# Patient Record
Sex: Male | Born: 1955 | Race: White | Hispanic: No | Marital: Married | Smoking: Former smoker
Health system: Southern US, Community
[De-identification: ages and names within clinical notes are randomized; demographics above are authoritative.]

## PROBLEM LIST (undated history)

## (undated) DIAGNOSIS — F32A Depression, unspecified: Secondary | ICD-10-CM

## (undated) DIAGNOSIS — E785 Hyperlipidemia, unspecified: Secondary | ICD-10-CM

## (undated) DIAGNOSIS — I82409 Acute embolism and thrombosis of unspecified deep veins of unspecified lower extremity: Secondary | ICD-10-CM

## (undated) DIAGNOSIS — G894 Chronic pain syndrome: Secondary | ICD-10-CM

## (undated) DIAGNOSIS — K5790 Diverticulosis of intestine, part unspecified, without perforation or abscess without bleeding: Secondary | ICD-10-CM

## (undated) DIAGNOSIS — G8929 Other chronic pain: Secondary | ICD-10-CM

## (undated) DIAGNOSIS — R7301 Impaired fasting glucose: Secondary | ICD-10-CM

## (undated) DIAGNOSIS — Z8489 Family history of other specified conditions: Secondary | ICD-10-CM

## (undated) DIAGNOSIS — F329 Major depressive disorder, single episode, unspecified: Secondary | ICD-10-CM

## (undated) DIAGNOSIS — G709 Myoneural disorder, unspecified: Secondary | ICD-10-CM

## (undated) DIAGNOSIS — M1990 Unspecified osteoarthritis, unspecified site: Secondary | ICD-10-CM

## (undated) DIAGNOSIS — I10 Essential (primary) hypertension: Secondary | ICD-10-CM

## (undated) DIAGNOSIS — K429 Umbilical hernia without obstruction or gangrene: Secondary | ICD-10-CM

## (undated) DIAGNOSIS — M25569 Pain in unspecified knee: Secondary | ICD-10-CM

## (undated) DIAGNOSIS — Z87442 Personal history of urinary calculi: Secondary | ICD-10-CM

## (undated) DIAGNOSIS — H6690 Otitis media, unspecified, unspecified ear: Secondary | ICD-10-CM

## (undated) DIAGNOSIS — M7050 Other bursitis of knee, unspecified knee: Secondary | ICD-10-CM

## (undated) DIAGNOSIS — K219 Gastro-esophageal reflux disease without esophagitis: Secondary | ICD-10-CM

## (undated) HISTORY — DX: Impaired fasting glucose: R73.01

## (undated) HISTORY — DX: Unspecified osteoarthritis, unspecified site: M19.90

## (undated) HISTORY — PX: SHOULDER ARTHROSCOPY WITH ROTATOR CUFF REPAIR: SHX5685

## (undated) HISTORY — DX: Umbilical hernia without obstruction or gangrene: K42.9

## (undated) HISTORY — DX: Essential (primary) hypertension: I10

## (undated) HISTORY — DX: Acute embolism and thrombosis of unspecified deep veins of unspecified lower extremity: I82.409

## (undated) HISTORY — DX: Major depressive disorder, single episode, unspecified: F32.9

## (undated) HISTORY — DX: Other bursitis of knee, unspecified knee: M70.50

## (undated) HISTORY — DX: Gastro-esophageal reflux disease without esophagitis: K21.9

## (undated) HISTORY — DX: Other chronic pain: G89.29

## (undated) HISTORY — PX: KIDNEY STONE SURGERY: SHX686

## (undated) HISTORY — DX: Hyperlipidemia, unspecified: E78.5

## (undated) HISTORY — DX: Depression, unspecified: F32.A

## (undated) HISTORY — DX: Chronic pain syndrome: G89.4

## (undated) HISTORY — DX: Pain in unspecified knee: M25.569

## (undated) HISTORY — DX: Diverticulosis of intestine, part unspecified, without perforation or abscess without bleeding: K57.90

## (undated) HISTORY — DX: Myoneural disorder, unspecified: G70.9

---

## 2004-04-27 ENCOUNTER — Emergency Department (HOSPITAL_COMMUNITY): Admission: EM | Admit: 2004-04-27 | Discharge: 2004-04-27 | Payer: Self-pay | Admitting: *Deleted

## 2007-03-28 ENCOUNTER — Ambulatory Visit: Payer: Self-pay | Admitting: Unknown Physician Specialty

## 2007-03-28 ENCOUNTER — Other Ambulatory Visit: Payer: Self-pay

## 2007-10-02 HISTORY — PX: JOINT REPLACEMENT: SHX530

## 2009-06-13 ENCOUNTER — Ambulatory Visit: Payer: Self-pay | Admitting: Gastroenterology

## 2009-06-21 LAB — HM COLONOSCOPY

## 2009-12-14 LAB — HM SIGMOIDOSCOPY

## 2011-12-21 ENCOUNTER — Ambulatory Visit: Payer: Self-pay | Admitting: Podiatry

## 2012-08-25 ENCOUNTER — Ambulatory Visit: Payer: Self-pay | Admitting: Family Medicine

## 2013-10-01 HISTORY — PX: COLONOSCOPY: SHX174

## 2014-04-16 DIAGNOSIS — I10 Essential (primary) hypertension: Secondary | ICD-10-CM | POA: Insufficient documentation

## 2014-09-15 ENCOUNTER — Encounter: Payer: Self-pay | Admitting: Family Medicine

## 2014-09-15 LAB — HM COLONOSCOPY

## 2015-04-19 ENCOUNTER — Ambulatory Visit (INDEPENDENT_AMBULATORY_CARE_PROVIDER_SITE_OTHER): Payer: BLUE CROSS/BLUE SHIELD | Admitting: Family Medicine

## 2015-04-19 ENCOUNTER — Encounter: Payer: Self-pay | Admitting: Family Medicine

## 2015-04-19 ENCOUNTER — Other Ambulatory Visit: Payer: Self-pay | Admitting: Family Medicine

## 2015-04-19 VITALS — BP 132/84 | HR 66 | Temp 98.4°F | Ht 68.6 in | Wt 266.7 lb

## 2015-04-19 DIAGNOSIS — I10 Essential (primary) hypertension: Secondary | ICD-10-CM | POA: Diagnosis not present

## 2015-04-19 DIAGNOSIS — Z6841 Body Mass Index (BMI) 40.0 and over, adult: Secondary | ICD-10-CM | POA: Insufficient documentation

## 2015-04-19 DIAGNOSIS — E669 Obesity, unspecified: Secondary | ICD-10-CM

## 2015-04-19 DIAGNOSIS — M170 Bilateral primary osteoarthritis of knee: Secondary | ICD-10-CM | POA: Diagnosis not present

## 2015-04-19 DIAGNOSIS — E785 Hyperlipidemia, unspecified: Secondary | ICD-10-CM

## 2015-04-19 DIAGNOSIS — I129 Hypertensive chronic kidney disease with stage 1 through stage 4 chronic kidney disease, or unspecified chronic kidney disease: Secondary | ICD-10-CM | POA: Insufficient documentation

## 2015-04-19 DIAGNOSIS — L0291 Cutaneous abscess, unspecified: Secondary | ICD-10-CM | POA: Diagnosis not present

## 2015-04-19 LAB — LIPID PANEL PICCOLO, WAIVED
Chol/HDL Ratio Piccolo,Waive: 4.6 mg/dL
Cholesterol Piccolo, Waived: 167 mg/dL (ref <200)
HDL Chol Piccolo, Waived: 36 mg/dL — ABNORMAL LOW (ref >59)
LDL Chol Calc Piccolo Waived: 109 mg/dL — ABNORMAL HIGH (ref <100)
Triglycerides Piccolo,Waived: 108 mg/dL (ref <150)
VLDL CHOL CALC PICCOLO,WAIVE: 22 mg/dL (ref <30)

## 2015-04-19 LAB — UA/M W/RFLX CULTURE, ROUTINE
Bilirubin, UA: NEGATIVE
GLUCOSE, UA: NEGATIVE
Ketones, UA: NEGATIVE
Leukocytes, UA: NEGATIVE
Nitrite, UA: NEGATIVE
RBC UA: NEGATIVE
SPEC GRAV UA: 1.03 (ref 1.005–1.030)
Urobilinogen, Ur: 0.2 mg/dL (ref 0.2–1.0)
pH, UA: 5.5 (ref 5.0–7.5)

## 2015-04-19 LAB — MICROALBUMIN, URINE WAIVED
Creatinine, Urine Waived: 300 mg/dL (ref 10–300)
Microalb, Ur Waived: 30 mg/L — ABNORMAL HIGH (ref 0–19)
Microalb/Creat Ratio: <30 mg/g (ref <30)

## 2015-04-19 MED ORDER — METOPROLOL SUCCINATE ER 100 MG PO TB24: 100.0000 mg | ORAL_TABLET | Freq: Every day | ORAL | Status: DC

## 2015-04-19 MED ORDER — SULFAMETHOXAZOLE-TRIMETHOPRIM 800-160 MG PO TABS: 1.0000 | ORAL_TABLET | Freq: Two times a day (BID) | ORAL | Status: DC

## 2015-04-19 MED ORDER — DICLOFENAC SODIUM 75 MG PO TBEC: 75.0000 mg | DELAYED_RELEASE_TABLET | Freq: Two times a day (BID) | ORAL | Status: DC

## 2015-04-19 MED ORDER — TRAMADOL HCL 50 MG PO TABS: 50.0000 mg | ORAL_TABLET | Freq: Four times a day (QID) | ORAL | Status: DC | PRN

## 2015-04-19 MED ORDER — BENAZEPRIL HCL 40 MG PO TABS: 40.0000 mg | ORAL_TABLET | Freq: Every day | ORAL | Status: DC

## 2015-04-19 NOTE — Patient Instructions (Signed)

## 2015-04-19 NOTE — Assessment & Plan Note (Signed)
Continue to follow with orthopedics. Rx for tramadol refilled today. Continue to monitor.

## 2015-04-19 NOTE — Assessment & Plan Note (Signed)
Better on recheck. Continue current regimen. Continue to monitor.  

## 2015-04-19 NOTE — Assessment & Plan Note (Signed)
Continue diet and exercise. Continue to monitor. CMP checked today.

## 2015-04-19 NOTE — Progress Notes (Signed)
BP 132/84 mmHg  Pulse 66  Temp(Src) 98.4 F (36.9 C)  Ht 5' 8.6" (1.742 m)  Wt 266 lb 11.2 oz (120.974 kg)  BMI 39.87 kg/m2  SpO2 97%   Subjective:    Patient ID: Andrew [name], male    DOB: [birth year], 57 y.o.   MRN: [id number]  HPI: Andrew [name] is a 59 y.o. male  Chief Complaint  Patient presents with  . Hypertension  . Hyperlipidemia   HYPERTENSION Hypertension status: controlled  Satisfied with current treatment? yes Duration of hypertension: chronic BP monitoring frequency:  not checking BP medication side effects:  no Medication compliance: excellent compliance Aspirin: yes Recurrent headaches: no Visual changes: no Palpitations: no Dyspnea: no Chest pain: no Lower extremity edema: no Dizzy/lightheaded: no   Knees are doing better. Has been having problems with anserine bursitis and is working on that.   SKIN LESION Duration: 5-6 days Location: lower abdomen Painful: yes Itching: no Onset: sudden Context: not changing Associated signs and symptoms: pain and oozing, open again History of skin cancer: no History of precancerous skin lesions: no Family history of skin cancer: no   Relevant past medical, surgical, family and social history reviewed and updated as indicated. Interim medical history since our last visit reviewed. Allergies and medications reviewed and updated.  Review of Systems  Constitutional: Negative.   Respiratory: Negative.   Cardiovascular: Negative.   Gastrointestinal: Negative.   Musculoskeletal: Positive for arthralgias. Negative for myalgias, back pain, joint swelling, gait problem, neck pain and neck stiffness.  Psychiatric/Behavioral: Negative.    Per HPI unless specifically indicated above     Objective:    BP 132/84 mmHg  Pulse 66  Temp(Src) 98.4 F (36.9 C)  Ht 5' 8.6" (1.742 m)  Wt 266 lb 11.2 oz (120.974 kg)  BMI 39.87 kg/m2  SpO2 97%  Wt Readings from Last 3 Encounters:  04/19/15 266 lb 11.2 oz (120.974  kg)  11/04/14 273 lb (123.832 kg)    Physical Exam  Constitutional: He is oriented to person, place, and time. He appears well-developed and well-nourished. No distress.  HENT:  Head: Normocephalic and atraumatic.  Right Ear: Hearing normal.  Left Ear: Hearing normal.  Nose: Nose normal.  Eyes: Conjunctivae and lids are normal. Right eye exhibits no discharge. Left eye exhibits no discharge. No scleral icterus.  Cardiovascular: Normal rate, regular rhythm and normal heart sounds.  Exam reveals no gallop and no friction rub.   No murmur heard. Pulmonary/Chest: Effort normal and breath sounds normal. No respiratory distress. He has no wheezes. He exhibits no tenderness.  Musculoskeletal: Normal range of motion.  Neurological: He is alert and oriented to person, place, and time.  Skin: Skin is warm, dry and intact. No rash noted. There is erythema. No pallor.  Open boil on the anterior abdomen, no fluctuance, mild erythema  Psychiatric: He has a normal mood and affect. His speech is normal and behavior is normal. Judgment and thought content normal. Cognition and memory are normal.  Nursing note and vitals reviewed.   No results found for this or any previous visit.    Assessment & Plan:   Problem List Items Addressed This Visit      Cardiovascular and Mediastinum   Essential hypertension    Better on recheck. Continue current regimen. Continue to monitor.       Relevant Medications   benazepril (LOTENSIN) 40 MG tablet   metoprolol succinate (TOPROL-XL) 100 MG 24 hr tablet     Other  Primary osteoarthritis of both knees    Continue to follow with orthopedics. Rx for tramadol refilled today. Continue to monitor.       Obesity    Continue diet and exercise. Continue to monitor. CMP checked today.        Other Visit Diagnoses    Abscess    -  Primary    Not fluctuant. Draining. Will treat with bactrim for 10 days. Call if not getting better or getting worse.      Hyperlipidemia        Cholesterol looks great today! Keep up diet and exercise. Recheck in 1 year.     Relevant Medications    benazepril (LOTENSIN) 40 MG tablet    metoprolol succinate (TOPROL-XL) 100 MG 24 hr tablet        Follow up plan: Return in about 3 months (around 07/20/2015).

## 2015-04-20 ENCOUNTER — Telehealth: Payer: Self-pay | Admitting: Family Medicine

## 2015-04-20 DIAGNOSIS — E875 Hyperkalemia: Secondary | ICD-10-CM

## 2015-04-20 LAB — COMPREHENSIVE METABOLIC PANEL
ALK PHOS: 92 IU/L (ref 39–117)
ALT: 30 IU/L (ref 0–44)
AST: 27 IU/L (ref 0–40)
Albumin/Globulin Ratio: 2.1 (ref 1.1–2.5)
Albumin: 4.1 g/dL (ref 3.5–5.5)
BILIRUBIN TOTAL: 1.1 mg/dL (ref 0.0–1.2)
BUN/Creatinine Ratio: 24 — ABNORMAL HIGH (ref 9–20)
BUN: 23 mg/dL (ref 6–24)
CHLORIDE: 101 mmol/L (ref 97–108)
CO2: 23 mmol/L (ref 18–29)
Calcium: 9.1 mg/dL (ref 8.7–10.2)
Creatinine, Ser: 0.94 mg/dL (ref 0.76–1.27)
GFR calc non Af Amer: 90 mL/min/{1.73_m2} (ref >59)
GFR, EST AFRICAN AMERICAN: 104 mL/min/{1.73_m2} (ref >59)
GLOBULIN, TOTAL: 2 g/dL (ref 1.5–4.5)
Glucose: 105 mg/dL — ABNORMAL HIGH (ref 65–99)
Potassium: 5.5 mmol/L — ABNORMAL HIGH (ref 3.5–5.2)
Sodium: 139 mmol/L (ref 134–144)
TOTAL PROTEIN: 6.1 g/dL (ref 6.0–8.5)

## 2015-04-20 LAB — TSH: TSH: 1.87 u[IU]/mL (ref 0.450–4.500)

## 2015-04-20 NOTE — Telephone Encounter (Signed)
Please let him know that his potassium is slightly elevated and that could be because of his medicine. He should avoid bananas ans salt substitutes and we'll recheck in 2 weeks. Order put in for BMP in 2 weeks- please put him on the lab schedule. Thank you!!!

## 2015-04-20 NOTE — Telephone Encounter (Signed)
Patient notified and pt scheduled.

## 2015-05-04 ENCOUNTER — Other Ambulatory Visit: Payer: BLUE CROSS/BLUE SHIELD

## 2015-05-04 DIAGNOSIS — E875 Hyperkalemia: Secondary | ICD-10-CM

## 2015-05-05 ENCOUNTER — Telehealth: Payer: Self-pay | Admitting: Family Medicine

## 2015-05-05 LAB — BASIC METABOLIC PANEL
BUN / CREAT RATIO: 24 — AB (ref 9–20)
BUN: 24 mg/dL (ref 6–24)
CHLORIDE: 101 mmol/L (ref 97–108)
CO2: 25 mmol/L (ref 18–29)
CREATININE: 1.02 mg/dL (ref 0.76–1.27)
Calcium: 8.8 mg/dL (ref 8.7–10.2)
GFR calc non Af Amer: 81 mL/min/{1.73_m2} (ref >59)
GFR, EST AFRICAN AMERICAN: 94 mL/min/{1.73_m2} (ref >59)
GLUCOSE: 98 mg/dL (ref 65–99)
Potassium: 5.6 mmol/L — ABNORMAL HIGH (ref 3.5–5.2)
SODIUM: 137 mmol/L (ref 134–144)

## 2015-05-05 NOTE — Telephone Encounter (Signed)
Called patient. Potassium still high. Not sure if he's eating anything with high potassium. Will send him a list. He will look it over and come back in 1 month for repeat BMP, if potassium still high, we will need to stop the benazepril.

## 2015-07-21 ENCOUNTER — Encounter: Payer: BLUE CROSS/BLUE SHIELD | Admitting: Family Medicine

## 2015-08-16 ENCOUNTER — Other Ambulatory Visit: Payer: Self-pay | Admitting: Family Medicine

## 2015-08-17 NOTE — Telephone Encounter (Signed)
He should be good until the middle of December. He also needs a 6 month follow up/physical coming in January, I'll refill it once he's got that booked

## 2015-08-17 NOTE — Telephone Encounter (Signed)
Called and left a message letting the patient know to call and schedule an appt, when this is done medication will be sent to his pharmacy.

## 2015-09-16 ENCOUNTER — Telehealth: Payer: Self-pay | Admitting: Family Medicine

## 2015-09-16 NOTE — Telephone Encounter (Signed)
Pt called and scheduled his CPE for 10/05/15 @ 8am. Pt stated he would need a refill on Tramadol just enough to last until his appt on 10/05/15. Pharm is [facility] on [address] in [place]. Thanks.

## 2015-09-19 MED ORDER — TRAMADOL HCL 50 MG PO TABS: 50.0000 mg | ORAL_TABLET | Freq: Four times a day (QID) | ORAL | Status: DC | PRN

## 2015-09-19 NOTE — Telephone Encounter (Signed)
[place] reviewed; other controlled substances are noted in his med list, prescribed by outside provider No early refills of the tramadol that I see Rx approved to last until primary returns

## 2015-09-19 NOTE — Telephone Encounter (Signed)
Forward to provider

## 2015-10-05 ENCOUNTER — Encounter: Payer: Self-pay | Admitting: Family Medicine

## 2015-10-05 ENCOUNTER — Ambulatory Visit (INDEPENDENT_AMBULATORY_CARE_PROVIDER_SITE_OTHER): Payer: BLUE CROSS/BLUE SHIELD | Admitting: Family Medicine

## 2015-10-05 VITALS — BP 149/84 | HR 65 | Temp 98.8°F | Ht 68.0 in | Wt 258.0 lb

## 2015-10-05 DIAGNOSIS — Z0000 Encounter for general adult medical examination without abnormal findings: Secondary | ICD-10-CM

## 2015-10-05 DIAGNOSIS — B372 Candidiasis of skin and nail: Secondary | ICD-10-CM

## 2015-10-05 DIAGNOSIS — I10 Essential (primary) hypertension: Secondary | ICD-10-CM

## 2015-10-05 DIAGNOSIS — Z23 Encounter for immunization: Secondary | ICD-10-CM | POA: Diagnosis not present

## 2015-10-05 LAB — UA/M W/RFLX CULTURE, ROUTINE
Bilirubin, UA: NEGATIVE
Glucose, UA: NEGATIVE
Leukocytes, UA: NEGATIVE
NITRITE UA: NEGATIVE
RBC, UA: NEGATIVE
Specific Gravity, UA: 1.03 (ref 1.005–1.030)
UUROB: 1 mg/dL (ref 0.2–1.0)
pH, UA: 5.5 (ref 5.0–7.5)

## 2015-10-05 LAB — MICROALBUMIN, URINE WAIVED
CREATININE, URINE WAIVED: 300 mg/dL (ref 10–300)
Microalb, Ur Waived: 80 mg/L — ABNORMAL HIGH (ref 0–19)

## 2015-10-05 MED ORDER — METOPROLOL SUCCINATE ER 100 MG PO TB24: ORAL_TABLET | ORAL | Status: DC

## 2015-10-05 MED ORDER — DICLOFENAC SODIUM 75 MG PO TBEC: DELAYED_RELEASE_TABLET | ORAL | Status: DC

## 2015-10-05 MED ORDER — TRAMADOL HCL 50 MG PO TABS: 50.0000 mg | ORAL_TABLET | Freq: Four times a day (QID) | ORAL | Status: DC | PRN

## 2015-10-05 MED ORDER — FLUCONAZOLE 150 MG PO TABS: 150.0000 mg | ORAL_TABLET | Freq: Every day | ORAL | Status: DC

## 2015-10-05 MED ORDER — BENAZEPRIL HCL 40 MG PO TABS: ORAL_TABLET | ORAL | Status: DC

## 2015-10-05 NOTE — Addendum Note (Signed)
Addended by: Valerie Roys on: 10/05/2015 11:53 AM   Modules accepted: Orders, SmartSet

## 2015-10-05 NOTE — Progress Notes (Signed)
BP 149/84 mmHg  Pulse 65  Temp(Src) 98.8 F (37.1 C)  Ht 5\' 8"  (1.727 m)  Wt 258 lb (117.028 kg)  BMI 39.24 kg/m2  SpO2 94%   Subjective:    Patient ID: Andrew [name], male    DOB: 02-01-1956, 60 y.o.   MRN: [id number]  HPI: Andrew [name] is a 60 y.o. male presenting on 10/05/2015 for comprehensive medical examination. Current medical complaints include:Has been having some pain in the perianal area with a rash. Has some blood when he wipes, has had a lot of problems getting clean. Being really careful to keep himself dry. Can't eat anything spicy or dairy. Sometimes has to use an enema.  He currently lives with: his wife Interim Problems from his last visit: no  Depression Screen done today and results listed below:  Depression screen [facility] 2/9 10/05/2015  Decreased Interest 0  Down, Depressed, Hopeless 0  PHQ - 2 Score 0    The patient does not have a history of falls. I did not complete a risk assessment for falls. A plan of care for falls was not documented.  Past Medical History:  Past Medical History  Diagnosis Date  . Depression   . Knee pain, chronic   . Diverticulosis   . Hyperlipidemia   . Hypertension   . Hernia, umbilical   . IFG (impaired fasting glucose)   . Pes anserine bursitis   . Chronic pain syndrome   . DVT (deep venous thrombosis) (HCC)     bilateral, was on anticoags for 38months    Surgical History:  Past Surgical History  Procedure Laterality Date  . Kidney stone surgery    . Joint replacement Bilateral 2009    replacement and revision    Medications:  Current Outpatient Prescriptions on File Prior to Visit  Medication Sig  . cyanocobalamin 1000 MCG tablet Take 500 mcg by mouth daily.  Marland Kitchen gabapentin (NEURONTIN) 300 MG capsule Take 600 mg by mouth at bedtime.   Marland Kitchen testosterone cypionate (DEPOTESTOSTERONE CYPIONATE) 200 MG/ML injection Inject into the muscle every 14 (fourteen) days.   No current facility-administered medications on file prior  to visit.    Allergies:  No Known Allergies  Social History:  Social History   Social History  . Marital Status: Married    Spouse Name: N/A  . Number of Children: N/A  . Years of Education: N/A   Occupational History  . Not on file.   Social History Main Topics  . Smoking status: Former Smoker    Quit date: 06/28/2011  . Smokeless tobacco: Never Used  . Alcohol Use: Yes     Comment: on occasion  . Drug Use: No  . Sexual Activity: Yes    Birth Control/ Protection: None   Other Topics Concern  . Not on file   Social History Narrative   History  Smoking status  . Former Smoker  . Quit date: 06/28/2011  Smokeless tobacco  . Never Used   History  Alcohol Use  . Yes    Comment: on occasion    Family History:  Family History  Problem Relation Age of Onset  . Alcohol abuse Father   . Hypertension Father   . Cirrhosis Father   . Hypertension Brother   . Stroke Paternal Grandmother   . Stroke Paternal Grandfather   . Pneumonia Mother     Past medical history, surgical history, medications, allergies, family history and social history reviewed with patient today and changes made  to appropriate areas of the chart.   Review of Systems  Constitutional: Positive for fever and diaphoresis (Off and on ). Negative for chills, weight loss and malaise/fatigue.  HENT: Negative.   Eyes: Negative.   Respiratory: Negative.   Cardiovascular: Negative.   Gastrointestinal: Negative.   Genitourinary: Negative.   Musculoskeletal: Negative.   Skin: Positive for rash. Negative for itching.  Neurological: Negative.  Negative for weakness.  Endo/Heme/Allergies: Negative.   Psychiatric/Behavioral: Negative.     All other ROS negative except what is listed above and in the HPI.      Objective:    BP 149/84 mmHg  Pulse 65  Temp(Src) 98.8 F (37.1 C)  Ht 5\' 8"  (1.727 m)  Wt 258 lb (117.028 kg)  BMI 39.24 kg/m2  SpO2 94%  Wt Readings from Last 3 Encounters:   10/05/15 258 lb (117.028 kg)  04/19/15 266 lb 11.2 oz (120.974 kg)  11/04/14 273 lb (123.832 kg)    Physical Exam  Constitutional: He is oriented to person, place, and time. He appears well-developed and well-nourished. No distress.  HENT:  Head: Normocephalic and atraumatic.  Right Ear: Hearing, tympanic membrane, external ear and ear canal normal.  Left Ear: Hearing, tympanic membrane, external ear and ear canal normal.  Nose: Nose normal.  Mouth/Throat: Uvula is midline, oropharynx is clear and moist and mucous membranes are normal. No oropharyngeal exudate.  Eyes: Conjunctivae, EOM and lids are normal. Pupils are equal, round, and reactive to light. Right eye exhibits no discharge. Left eye exhibits no discharge. No scleral icterus.  Neck: Normal range of motion. Neck supple. No JVD present. No tracheal deviation present. No thyromegaly present.  Cardiovascular: Normal rate, regular rhythm, normal heart sounds and intact distal pulses.  Exam reveals no gallop and no friction rub.   No murmur heard. Pulmonary/Chest: Effort normal and breath sounds normal. No stridor. No respiratory distress. He has no wheezes. He has no rales. He exhibits no tenderness.  Abdominal: Soft. Bowel sounds are normal. He exhibits no distension and no mass. There is no tenderness. There is no rebound and no guarding. A hernia (umbilical) is present. Hernia confirmed negative in the right inguinal area and confirmed negative in the left inguinal area.  Genitourinary: Rectum normal, testes normal and penis normal. Cremasteric reflex is present. Circumcised. No penile tenderness.  Red irritated skin in perianal area  Musculoskeletal: Normal range of motion. He exhibits no edema or tenderness.  Lymphadenopathy:    He has no cervical adenopathy.       Right: No inguinal adenopathy present.       Left: No inguinal adenopathy present.  Neurological: He is alert and oriented to person, place, and time. He has normal  reflexes. He displays normal reflexes. No cranial nerve deficit. He exhibits normal muscle tone. Coordination normal.  Skin: Skin is warm, dry and intact. No rash noted. He is not diaphoretic. No erythema. No pallor.  Psychiatric: He has a normal mood and affect. His speech is normal and behavior is normal. Judgment and thought content normal. Cognition and memory are normal.  Nursing note and vitals reviewed.   Results for orders placed or performed in visit on 123456  Basic metabolic panel  Result Value Ref Range   Glucose 98 65 - 99 mg/dL   BUN 24 6 - 24 mg/dL   Creatinine, Ser 1.02 0.76 - 1.27 mg/dL   GFR calc non Af Amer 81 >59 mL/min/1.73   GFR calc Af Amer 94 >59 mL/min/1.73  BUN/Creatinine Ratio 24 (H) 9 - 20   Sodium 137 134 - 144 mmol/L   Potassium 5.6 (H) 3.5 - 5.2 mmol/L   Chloride 101 97 - 108 mmol/L   CO2 25 18 - 29 mmol/L   Calcium 8.8 8.7 - 10.2 mg/dL      Assessment & Plan:   Problem List Items Addressed This Visit      Cardiovascular and Mediastinum   Essential hypertension    BP elevated. Possibly due to weight gain and increased NSAID. Stop meloxicam and DASH diet, check again in 1-2 months.       Relevant Medications   benazepril (LOTENSIN) 40 MG tablet   metoprolol succinate (TOPROL-XL) 100 MG 24 hr tablet   Other Relevant Orders   Microalbumin, Urine Waived    Other Visit Diagnoses    Candidal intertrigo    -  Primary    Will start diflucan. Continue to monitor. Call if not getting better or getting worse.     Relevant Medications    fluconazole (DIFLUCAN) 150 MG tablet    Immunization due        Flu shot given today.    Relevant Orders    Flu Vaccine QUAD 36+ mos PF IM (Fluarix & Fluzone Quad PF) (Completed)    Routine general medical examination at a health care facility        Screening labs checked today. Up to date on vaccines. Up to date on colonoscopy. Continue diet and exercise. Continue to monitor.     Relevant Orders    CBC with  Differential/Platelet    Comprehensive metabolic panel    Lipid Panel w/o Chol/HDL Ratio    TSH    UA/M w/rflx Culture, Routine    PSA    Microalbumin, Urine Waived        Discussed aspirin prophylaxis for myocardial infarction prevention and decision was it was not indicated, he is on arimidex.   LABORATORY TESTING:  Health maintenance labs ordered today as discussed above.   The natural history of prostate cancer and ongoing controversy regarding screening and potential treatment outcomes of prostate cancer has been discussed with the patient. The meaning of a false positive PSA and a false negative PSA has been discussed. He indicates understanding of the limitations of this screening test and wishes to proceed with screening PSA testing.  IMMUNIZATIONS:   - Tdap: Tetanus vaccination status reviewed: last tetanus booster within 10 years. - Influenza: Administered today  SCREENING: - Colonoscopy: Up to date  Discussed with patient purpose of the colonoscopy is to detect colon cancer at curable precancerous or early stages   PATIENT COUNSELING:    Sexuality: Discussed sexually transmitted diseases, partner selection, use of condoms, avoidance of unintended pregnancy  and contraceptive alternatives.   Advised to avoid cigarette smoking.  I discussed with the patient that most people either abstain from alcohol or drink within safe limits (<=14/week and <=4 drinks/occasion for males, <=7/weeks and <= 3 drinks/occasion for females) and that the risk for alcohol disorders and other health effects rises proportionally with the number of drinks per week and how often a drinker exceeds daily limits.  Discussed cessation/primary prevention of drug use and availability of treatment for abuse.   Diet: Encouraged to adjust caloric intake to maintain  or achieve ideal body weight, to reduce intake of dietary saturated fat and total fat, to limit sodium intake by avoiding high sodium foods and  not adding table salt, and to maintain adequate dietary  potassium and calcium preferably from fresh fruits, vegetables, and low-fat dairy products.    stressed the importance of regular exercise  Injury prevention: Discussed safety belts, safety helmets, smoke detector, smoking near bedding or upholstery.   Dental health: Discussed importance of regular tooth brushing, flossing, and dental visits.   Follow up plan: NEXT PREVENTATIVE PHYSICAL DUE IN 1 YEAR. Return 1-2 months, for BP follow up.

## 2015-10-05 NOTE — Assessment & Plan Note (Signed)
BP elevated. Possibly due to weight gain and increased NSAID. Stop meloxicam and DASH diet, check again in 1-2 months.

## 2015-10-06 ENCOUNTER — Other Ambulatory Visit: Payer: Self-pay | Admitting: Family Medicine

## 2015-10-06 ENCOUNTER — Encounter: Payer: Self-pay | Admitting: Family Medicine

## 2015-10-06 LAB — COMPREHENSIVE METABOLIC PANEL
ALBUMIN: 4 g/dL (ref 3.5–5.5)
ALT: 22 IU/L (ref 0–44)
AST: 16 IU/L (ref 0–40)
Albumin/Globulin Ratio: 2 (ref 1.1–2.5)
Alkaline Phosphatase: 93 IU/L (ref 39–117)
BILIRUBIN TOTAL: 0.7 mg/dL (ref 0.0–1.2)
BUN / CREAT RATIO: 26 — AB (ref 9–20)
BUN: 25 mg/dL — AB (ref 6–24)
CALCIUM: 8.7 mg/dL (ref 8.7–10.2)
CHLORIDE: 103 mmol/L (ref 96–106)
CO2: 24 mmol/L (ref 18–29)
CREATININE: 0.96 mg/dL (ref 0.76–1.27)
GFR calc non Af Amer: 87 mL/min/{1.73_m2} (ref >59)
GFR, EST AFRICAN AMERICAN: 101 mL/min/{1.73_m2} (ref >59)
GLUCOSE: 96 mg/dL (ref 65–99)
Globulin, Total: 2 g/dL (ref 1.5–4.5)
Potassium: 5.1 mmol/L (ref 3.5–5.2)
Sodium: 141 mmol/L (ref 134–144)
TOTAL PROTEIN: 6 g/dL (ref 6.0–8.5)

## 2015-10-06 LAB — CBC WITH DIFFERENTIAL/PLATELET
BASOS ABS: 0 10*3/uL (ref 0.0–0.2)
Basos: 1 %
EOS (ABSOLUTE): 0.5 10*3/uL — AB (ref 0.0–0.4)
Eos: 6 %
HEMOGLOBIN: 17.8 g/dL — AB (ref 12.6–17.7)
Hematocrit: 51.5 % — ABNORMAL HIGH (ref 37.5–51.0)
IMMATURE GRANS (ABS): 0 10*3/uL (ref 0.0–0.1)
IMMATURE GRANULOCYTES: 0 %
LYMPHS: 18 %
Lymphocytes Absolute: 1.5 10*3/uL (ref 0.7–3.1)
MCH: 32 pg (ref 26.6–33.0)
MCHC: 34.6 g/dL (ref 31.5–35.7)
MCV: 93 fL (ref 79–97)
MONOCYTES: 9 %
Monocytes Absolute: 0.8 10*3/uL (ref 0.1–0.9)
NEUTROS PCT: 66 %
Neutrophils Absolute: 5.7 10*3/uL (ref 1.4–7.0)
PLATELETS: 138 10*3/uL — AB (ref 150–379)
RBC: 5.57 x10E6/uL (ref 4.14–5.80)
RDW: 14 % (ref 12.3–15.4)
WBC: 8.5 10*3/uL (ref 3.4–10.8)

## 2015-10-06 LAB — LIPID PANEL W/O CHOL/HDL RATIO
Cholesterol, Total: 190 mg/dL (ref 100–199)
HDL: 39 mg/dL — AB (ref >39)
LDL CALC: 138 mg/dL — AB (ref 0–99)
Triglycerides: 66 mg/dL (ref 0–149)
VLDL CHOLESTEROL CAL: 13 mg/dL (ref 5–40)

## 2015-10-06 LAB — TSH: TSH: 2.51 u[IU]/mL (ref 0.450–4.500)

## 2015-10-21 ENCOUNTER — Telehealth: Payer: Self-pay | Admitting: Family Medicine

## 2015-10-21 MED ORDER — FLUCONAZOLE 150 MG PO TABS: 150.0000 mg | ORAL_TABLET | Freq: Every day | ORAL | Status: DC

## 2015-10-21 NOTE — Telephone Encounter (Signed)
Rx sent to his pharmacy. If not better with that will need to be seen

## 2015-10-21 NOTE — Telephone Encounter (Signed)
Patient notified

## 2015-10-21 NOTE — Telephone Encounter (Signed)
Pt is still experiencing issues from yeast infection and would like to have something called in to [facility] [address]

## 2015-10-21 NOTE — Telephone Encounter (Signed)
Forward to provider

## 2015-11-14 ENCOUNTER — Ambulatory Visit: Payer: BLUE CROSS/BLUE SHIELD | Admitting: Family Medicine

## 2015-12-30 ENCOUNTER — Telehealth: Payer: Self-pay

## 2015-12-30 ENCOUNTER — Ambulatory Visit (INDEPENDENT_AMBULATORY_CARE_PROVIDER_SITE_OTHER): Payer: BLUE CROSS/BLUE SHIELD | Admitting: Family Medicine

## 2015-12-30 ENCOUNTER — Other Ambulatory Visit: Payer: Self-pay | Admitting: Family Medicine

## 2015-12-30 ENCOUNTER — Encounter: Payer: Self-pay | Admitting: Family Medicine

## 2015-12-30 VITALS — BP 140/88 | HR 69 | Temp 98.4°F | Ht 67.1 in | Wt 264.0 lb

## 2015-12-30 DIAGNOSIS — I129 Hypertensive chronic kidney disease with stage 1 through stage 4 chronic kidney disease, or unspecified chronic kidney disease: Secondary | ICD-10-CM

## 2015-12-30 MED ORDER — HYDROCHLOROTHIAZIDE 12.5 MG PO CAPS
12.5000 mg | ORAL_CAPSULE | Freq: Every day | ORAL | Status: DC
Start: 2015-12-30 — End: 2016-07-16

## 2015-12-30 NOTE — Assessment & Plan Note (Signed)
Will add hctz 12.5mg . Will check BP in 1 month. Call with any concerns.

## 2015-12-30 NOTE — Progress Notes (Signed)
BP 140/88 mmHg  Pulse 69  Temp(Src) 98.4 F (36.9 C)  Ht 5' 7.1" (1.704 m)  Wt 264 lb (119.75 kg)  BMI 41.24 kg/m2  SpO2 97%   Subjective:    Patient ID: Andrew [name], male    DOB: 09-12-1956, 60 y.o.   MRN: [id number]  HPI: Andrew [name] is a 60 y.o. male  Chief Complaint  Patient presents with  . Hypertension   HYPERTENSION Hypertension status: exacerbated  Satisfied with current treatment? yes Duration of hypertension: chronic BP monitoring frequency:  rarely BP medication side effects:  no Medication compliance: excellent compliance Aspirin: no Recurrent headaches: no Visual changes: no Palpitations: yes Dyspnea: no Chest pain: no Lower extremity edema: no Dizzy/lightheaded: no  Relevant past medical, surgical, family and social history reviewed and updated as indicated. Interim medical history since our last visit reviewed. Allergies and medications reviewed and updated.  Review of Systems  Constitutional: Negative.   Respiratory: Negative.   Cardiovascular: Negative.  Negative for chest pain, palpitations and leg swelling.  Musculoskeletal: Positive for back pain. Negative for myalgias, joint swelling, arthralgias, gait problem, neck pain and neck stiffness.  Psychiatric/Behavioral: Negative.     Per HPI unless specifically indicated above     Objective:    BP 140/88 mmHg  Pulse 69  Temp(Src) 98.4 F (36.9 C)  Ht 5' 7.1" (1.704 m)  Wt 264 lb (119.75 kg)  BMI 41.24 kg/m2  SpO2 97%  Wt Readings from Last 3 Encounters:  12/30/15 264 lb (119.75 kg)  10/05/15 258 lb (117.028 kg)  04/19/15 266 lb 11.2 oz (120.974 kg)    Physical Exam  Constitutional: He is oriented to person, place, and time. He appears well-developed and well-nourished. No distress.  HENT:  Head: Normocephalic and atraumatic.  Right Ear: Hearing normal.  Left Ear: Hearing normal.  Nose: Nose normal.  Eyes: Conjunctivae and lids are normal. Right eye exhibits no discharge. Left  eye exhibits no discharge. No scleral icterus.  Cardiovascular: Normal rate, regular rhythm, normal heart sounds and intact distal pulses.  Exam reveals no gallop and no friction rub.   No murmur heard. Pulmonary/Chest: Effort normal and breath sounds normal. No respiratory distress. He has no wheezes. He has no rales. He exhibits no tenderness.  Musculoskeletal: Normal range of motion.  Neurological: He is alert and oriented to person, place, and time.  Skin: Skin is warm, dry and intact. No rash noted. He is not diaphoretic. No erythema. No pallor.  Psychiatric: He has a normal mood and affect. His speech is normal and behavior is normal. Judgment and thought content normal. Cognition and memory are normal.  Nursing note and vitals reviewed.   Results for orders placed or performed in visit on 10/05/15  CBC with Differential/Platelet  Result Value Ref Range   WBC 8.5 3.4 - 10.8 x10E3/uL   RBC 5.57 4.14 - 5.80 x10E6/uL   Hemoglobin 17.8 (H) 12.6 - 17.7 g/dL   Hematocrit 51.5 (H) 37.5 - 51.0 %   MCV 93 79 - 97 fL   MCH 32.0 26.6 - 33.0 pg   MCHC 34.6 31.5 - 35.7 g/dL   RDW 14.0 12.3 - 15.4 %   Platelets 138 (L) 150 - 379 x10E3/uL   Neutrophils 66 %   Lymphs 18 %   Monocytes 9 %   Eos 6 %   Basos 1 %   Neutrophils Absolute 5.7 1.4 - 7.0 x10E3/uL   Lymphocytes Absolute 1.5 0.7 - 3.1 x10E3/uL  Monocytes Absolute 0.8 0.1 - 0.9 x10E3/uL   EOS (ABSOLUTE) 0.5 (H) 0.0 - 0.4 x10E3/uL   Basophils Absolute 0.0 0.0 - 0.2 x10E3/uL   Immature Granulocytes 0 %   Immature Grans (Abs) 0.0 0.0 - 0.1 x10E3/uL  Comprehensive metabolic panel  Result Value Ref Range   Glucose 96 65 - 99 mg/dL   BUN 25 (H) 6 - 24 mg/dL   Creatinine, Ser 0.96 0.76 - 1.27 mg/dL   GFR calc non Af Amer 87 >59 mL/min/1.73   GFR calc Af Amer 101 >59 mL/min/1.73   BUN/Creatinine Ratio 26 (H) 9 - 20   Sodium 141 134 - 144 mmol/L   Potassium 5.1 3.5 - 5.2 mmol/L   Chloride 103 96 - 106 mmol/L   CO2 24 18 - 29 mmol/L    Calcium 8.7 8.7 - 10.2 mg/dL   Total Protein 6.0 6.0 - 8.5 g/dL   Albumin 4.0 3.5 - 5.5 g/dL   Globulin, Total 2.0 1.5 - 4.5 g/dL   Albumin/Globulin Ratio 2.0 1.1 - 2.5   Bilirubin Total 0.7 0.0 - 1.2 mg/dL   Alkaline Phosphatase 93 39 - 117 IU/L   AST 16 0 - 40 IU/L   ALT 22 0 - 44 IU/L  Lipid Panel w/o Chol/HDL Ratio  Result Value Ref Range   Cholesterol, Total 190 100 - 199 mg/dL   Triglycerides 66 0 - 149 mg/dL   HDL 39 (L) >39 mg/dL   VLDL Cholesterol Cal 13 5 - 40 mg/dL   LDL Calculated 138 (H) 0 - 99 mg/dL  TSH  Result Value Ref Range   TSH 2.510 0.450 - 4.500 uIU/mL  UA/M w/rflx Culture, Routine  Result Value Ref Range   Specific Gravity, UA 1.030 1.005 - 1.030   pH, UA 5.5 5.0 - 7.5   Color, UA Yellow Yellow   Appearance Ur Clear Clear   Leukocytes, UA Negative Negative   Protein, UA Trace Negative/Trace   Glucose, UA Negative Negative   Ketones, UA Trace (A) Negative   RBC, UA Negative Negative   Bilirubin, UA Negative Negative   Urobilinogen, Ur 1.0 0.2 - 1.0 mg/dL   Nitrite, UA Negative Negative  Microalbumin, Urine Waived  Result Value Ref Range   Microalb, Ur Waived 80 (H) 0 - 19 mg/L   Creatinine, Urine Waived 300 10 - 300 mg/dL   Microalb/Creat Ratio 30-300 (H) <30 mg/g      Assessment & Plan:   Problem List Items Addressed This Visit      Genitourinary   Benign hypertensive renal disease - Primary    Will add hctz 12.5mg . Will check BP in 1 month. Call with any concerns.       Relevant Orders   Basic metabolic panel       Follow up plan: Return in about 4 weeks (around 01/27/2016) for BP check.

## 2015-12-30 NOTE — Telephone Encounter (Signed)
Called in Tramadol Rx to [place]

## 2016-01-13 ENCOUNTER — Other Ambulatory Visit: Payer: Self-pay | Admitting: Family Medicine

## 2016-01-13 NOTE — Telephone Encounter (Signed)
6 month supply given in January, shouldn't be due

## 2016-01-30 ENCOUNTER — Ambulatory Visit (INDEPENDENT_AMBULATORY_CARE_PROVIDER_SITE_OTHER): Payer: BLUE CROSS/BLUE SHIELD | Admitting: Family Medicine

## 2016-01-30 ENCOUNTER — Other Ambulatory Visit: Payer: Self-pay | Admitting: Family Medicine

## 2016-01-30 ENCOUNTER — Encounter: Payer: Self-pay | Admitting: Family Medicine

## 2016-01-30 ENCOUNTER — Encounter (INDEPENDENT_AMBULATORY_CARE_PROVIDER_SITE_OTHER): Payer: Self-pay

## 2016-01-30 VITALS — BP 108/64 | HR 64 | Temp 98.5°F | Ht 67.0 in | Wt 266.0 lb

## 2016-01-30 DIAGNOSIS — I129 Hypertensive chronic kidney disease with stage 1 through stage 4 chronic kidney disease, or unspecified chronic kidney disease: Secondary | ICD-10-CM

## 2016-01-30 NOTE — Assessment & Plan Note (Signed)
Under great control. Continue current regimen. Rechecking BMP today. Call with any concerns.

## 2016-01-30 NOTE — Progress Notes (Signed)
BP 108/64 mmHg  Pulse 64  Temp(Src) 98.5 F (36.9 C)  Ht 5\' 7"  (1.702 m)  Wt 266 lb (120.657 kg)  BMI 41.65 kg/m2  SpO2 96%   Subjective:    Patient ID: Andrew [name], male    DOB: [birth year], 58 y.o.   MRN: [id number]  HPI: VASILIOS BATZ is a 60 y.o. male  Chief Complaint  Patient presents with  . Hypertension    follow up   HYPERTENSION Hypertension status: better  Satisfied with current treatment? yes Duration of hypertension: chronic BP monitoring frequency:  a few times a week BP range: 130s-140s/ 70s-80s BP medication side effects:  no Medication compliance: excellent compliance Aspirin: no Recurrent headaches: no Visual changes: no Palpitations: no Dyspnea: no Chest pain: no Lower extremity edema: no Dizzy/lightheaded: no  Relevant past medical, surgical, family and social history reviewed and updated as indicated. Interim medical history since our last visit reviewed. Allergies and medications reviewed and updated.  Review of Systems  Constitutional: Negative.   Respiratory: Negative.   Cardiovascular: Negative.   Psychiatric/Behavioral: Negative.     Per HPI unless specifically indicated above     Objective:    BP 108/64 mmHg  Pulse 64  Temp(Src) 98.5 F (36.9 C)  Ht 5\' 7"  (1.702 m)  Wt 266 lb (120.657 kg)  BMI 41.65 kg/m2  SpO2 96%  Wt Readings from Last 3 Encounters:  01/30/16 266 lb (120.657 kg)  12/30/15 264 lb (119.75 kg)  10/05/15 258 lb (117.028 kg)    Physical Exam  Constitutional: He is oriented to person, place, and time. He appears well-developed and well-nourished. No distress.  HENT:  Head: Normocephalic and atraumatic.  Right Ear: Hearing normal.  Left Ear: Hearing normal.  Nose: Nose normal.  Eyes: Conjunctivae and lids are normal. Right eye exhibits no discharge. Left eye exhibits no discharge. No scleral icterus.  Cardiovascular: Normal rate, regular rhythm, normal heart sounds and intact distal pulses.  Exam reveals  no gallop and no friction rub.   No murmur heard. Pulmonary/Chest: Effort normal and breath sounds normal. No respiratory distress. He has no wheezes. He has no rales. He exhibits no tenderness.  Musculoskeletal: Normal range of motion.  Neurological: He is alert and oriented to person, place, and time.  Skin: Skin is warm, dry and intact. No rash noted. No erythema. No pallor.  Psychiatric: He has a normal mood and affect. His speech is normal and behavior is normal. Judgment and thought content normal. Cognition and memory are normal.  Nursing note and vitals reviewed.   Results for orders placed or performed in visit on 10/05/15  CBC with Differential/Platelet  Result Value Ref Range   WBC 8.5 3.4 - 10.8 x10E3/uL   RBC 5.57 4.14 - 5.80 x10E6/uL   Hemoglobin 17.8 (H) 12.6 - 17.7 g/dL   Hematocrit 51.5 (H) 37.5 - 51.0 %   MCV 93 79 - 97 fL   MCH 32.0 26.6 - 33.0 pg   MCHC 34.6 31.5 - 35.7 g/dL   RDW 14.0 12.3 - 15.4 %   Platelets 138 (L) 150 - 379 x10E3/uL   Neutrophils 66 %   Lymphs 18 %   Monocytes 9 %   Eos 6 %   Basos 1 %   Neutrophils Absolute 5.7 1.4 - 7.0 x10E3/uL   Lymphocytes Absolute 1.5 0.7 - 3.1 x10E3/uL   Monocytes Absolute 0.8 0.1 - 0.9 x10E3/uL   EOS (ABSOLUTE) 0.5 (H) 0.0 - 0.4 x10E3/uL   Basophils  Absolute 0.0 0.0 - 0.2 x10E3/uL   Immature Granulocytes 0 %   Immature Grans (Abs) 0.0 0.0 - 0.1 x10E3/uL  Comprehensive metabolic panel  Result Value Ref Range   Glucose 96 65 - 99 mg/dL   BUN 25 (H) 6 - 24 mg/dL   Creatinine, Ser 0.96 0.76 - 1.27 mg/dL   GFR calc non Af Amer 87 >59 mL/min/1.73   GFR calc Af Amer 101 >59 mL/min/1.73   BUN/Creatinine Ratio 26 (H) 9 - 20   Sodium 141 134 - 144 mmol/L   Potassium 5.1 3.5 - 5.2 mmol/L   Chloride 103 96 - 106 mmol/L   CO2 24 18 - 29 mmol/L   Calcium 8.7 8.7 - 10.2 mg/dL   Total Protein 6.0 6.0 - 8.5 g/dL   Albumin 4.0 3.5 - 5.5 g/dL   Globulin, Total 2.0 1.5 - 4.5 g/dL   Albumin/Globulin Ratio 2.0 1.1 - 2.5    Bilirubin Total 0.7 0.0 - 1.2 mg/dL   Alkaline Phosphatase 93 39 - 117 IU/L   AST 16 0 - 40 IU/L   ALT 22 0 - 44 IU/L  Lipid Panel w/o Chol/HDL Ratio  Result Value Ref Range   Cholesterol, Total 190 100 - 199 mg/dL   Triglycerides 66 0 - 149 mg/dL   HDL 39 (L) >39 mg/dL   VLDL Cholesterol Cal 13 5 - 40 mg/dL   LDL Calculated 138 (H) 0 - 99 mg/dL  TSH  Result Value Ref Range   TSH 2.510 0.450 - 4.500 uIU/mL  UA/M w/rflx Culture, Routine  Result Value Ref Range   Specific Gravity, UA 1.030 1.005 - 1.030   pH, UA 5.5 5.0 - 7.5   Color, UA Yellow Yellow   Appearance Ur Clear Clear   Leukocytes, UA Negative Negative   Protein, UA Trace Negative/Trace   Glucose, UA Negative Negative   Ketones, UA Trace (A) Negative   RBC, UA Negative Negative   Bilirubin, UA Negative Negative   Urobilinogen, Ur 1.0 0.2 - 1.0 mg/dL   Nitrite, UA Negative Negative  Microalbumin, Urine Waived  Result Value Ref Range   Microalb, Ur Waived 80 (H) 0 - 19 mg/L   Creatinine, Urine Waived 300 10 - 300 mg/dL   Microalb/Creat Ratio 30-300 (H) <30 mg/g      Assessment & Plan:   Problem List Items Addressed This Visit      Genitourinary   Benign hypertensive renal disease - Primary    Under great control. Continue current regimen. Rechecking BMP today. Call with any concerns.      Relevant Orders   Basic metabolic panel       Follow up plan: Return in about 6 months (around 08/01/2016) for BP check.

## 2016-01-31 ENCOUNTER — Encounter: Payer: Self-pay | Admitting: Family Medicine

## 2016-01-31 LAB — BASIC METABOLIC PANEL
BUN/Creatinine Ratio: 26 — ABNORMAL HIGH (ref 9–20)
BUN: 29 mg/dL — AB (ref 6–24)
CALCIUM: 8.9 mg/dL (ref 8.7–10.2)
CHLORIDE: 99 mmol/L (ref 96–106)
CO2: 25 mmol/L (ref 18–29)
Creatinine, Ser: 1.1 mg/dL (ref 0.76–1.27)
GFR calc Af Amer: 85 mL/min/{1.73_m2} (ref >59)
GFR calc non Af Amer: 74 mL/min/{1.73_m2} (ref >59)
GLUCOSE: 61 mg/dL — AB (ref 65–99)
Potassium: 4.8 mmol/L (ref 3.5–5.2)
Sodium: 139 mmol/L (ref 134–144)

## 2016-03-13 ENCOUNTER — Other Ambulatory Visit: Payer: Self-pay | Admitting: Family Medicine

## 2016-03-13 NOTE — Telephone Encounter (Signed)
Called in [facility] [address]

## 2016-03-13 NOTE — Telephone Encounter (Signed)
OK to call in

## 2016-07-06 ENCOUNTER — Telehealth: Payer: Self-pay | Admitting: Family Medicine

## 2016-07-09 ENCOUNTER — Other Ambulatory Visit: Payer: Self-pay | Admitting: Family Medicine

## 2016-07-11 NOTE — Telephone Encounter (Signed)
Don't need to send message anymore.

## 2016-07-16 ENCOUNTER — Ambulatory Visit (INDEPENDENT_AMBULATORY_CARE_PROVIDER_SITE_OTHER): Payer: BLUE CROSS/BLUE SHIELD | Admitting: Family Medicine

## 2016-07-16 ENCOUNTER — Encounter: Payer: Self-pay | Admitting: Family Medicine

## 2016-07-16 ENCOUNTER — Other Ambulatory Visit: Payer: Self-pay | Admitting: Family Medicine

## 2016-07-16 ENCOUNTER — Encounter: Payer: Self-pay | Admitting: *Deleted

## 2016-07-16 VITALS — BP 131/78 | HR 68 | Temp 98.4°F | Wt 278.1 lb

## 2016-07-16 DIAGNOSIS — E291 Testicular hypofunction: Secondary | ICD-10-CM | POA: Insufficient documentation

## 2016-07-16 DIAGNOSIS — M170 Bilateral primary osteoarthritis of knee: Secondary | ICD-10-CM

## 2016-07-16 DIAGNOSIS — I129 Hypertensive chronic kidney disease with stage 1 through stage 4 chronic kidney disease, or unspecified chronic kidney disease: Secondary | ICD-10-CM

## 2016-07-16 DIAGNOSIS — Z23 Encounter for immunization: Secondary | ICD-10-CM

## 2016-07-16 DIAGNOSIS — M545 Low back pain: Secondary | ICD-10-CM | POA: Diagnosis not present

## 2016-07-16 DIAGNOSIS — K429 Umbilical hernia without obstruction or gangrene: Secondary | ICD-10-CM | POA: Diagnosis not present

## 2016-07-16 DIAGNOSIS — H66001 Acute suppurative otitis media without spontaneous rupture of ear drum, right ear: Secondary | ICD-10-CM

## 2016-07-16 MED ORDER — AMOXICILLIN 875 MG PO TABS
875.0000 mg | ORAL_TABLET | Freq: Two times a day (BID) | ORAL | 0 refills | Status: DC
Start: 2016-07-16 — End: 2016-08-01

## 2016-07-16 MED ORDER — BENAZEPRIL HCL 40 MG PO TABS: 40.0000 mg | ORAL_TABLET | Freq: Every day | ORAL | 1 refills | Status: DC

## 2016-07-16 MED ORDER — TRAMADOL HCL 50 MG PO TABS: 50.0000 mg | ORAL_TABLET | Freq: Four times a day (QID) | ORAL | 4 refills | Status: DC | PRN

## 2016-07-16 MED ORDER — HYDROCHLOROTHIAZIDE 12.5 MG PO CAPS: 12.5000 mg | ORAL_CAPSULE | Freq: Every day | ORAL | 1 refills | Status: DC

## 2016-07-16 MED ORDER — METOPROLOL SUCCINATE ER 100 MG PO TB24: ORAL_TABLET | ORAL | 1 refills | Status: DC

## 2016-07-16 NOTE — Assessment & Plan Note (Signed)
Will check labs today. Await results. Will treat as needed. Patient gives himself his injections.

## 2016-07-16 NOTE — Progress Notes (Signed)
BP 131/78 (BP Location: Left Arm, Patient Position: Sitting, Cuff Size: Normal)   Pulse 68   Temp 98.4 F (36.9 C)   Wt 278 lb 1.6 oz (126.1 kg)   SpO2 96%   BMI 43.56 kg/m    Subjective:    Patient ID: Andrew [name], male    DOB: 1956-09-29, 60 y.o.   MRN: [id number]  HPI: Andrew [name] is a 60 y.o. male  Chief Complaint  Patient presents with  . Hypertension  . Back Pain  . Clogged ear    right  . Umbilical Hernia    Referral  . Hypogonadism    Patient would like you to manage his testosterone   . Knee Pain    Patient needs a refill on Tramadol   HYPERTENSION Hypertension status: controlled  Satisfied with current treatment? yes Duration of hypertension: chronic BP monitoring frequency:  not checking BP medication side effects:  no Medication compliance: excellent compliance Aspirin: no Recurrent headaches: no Visual changes: no Palpitations: no Dyspnea: no Chest pain: no Lower extremity edema: no Dizzy/lightheaded: no   EAG CLOGGED Duration: 3 weeks Involved ear(s):  "right Sensation of feeling clogged/plugged: yes Decreased/muffled hearing:yes Ear pain: yes Fever: no Otorrhea: no Hearing loss: no Upper respiratory infection symptoms: no Using Q-Tips: yes Status: fluctuating History of cerumenosis: yes Treatments attempted: ear washes  HERNIA Duration: chronic Location:  belly button Painful: no Discomfort: no Bulge: yes Quality:  soreness Onset: gradual Severity: mild Context: stable Aggravating factors: standing for a long period  LOW TESTOSTERONE Duration: chronic Status: exacerbated- has been off his medicine for about 2 months Satisfied with current treatment:  yes Previous testosterone therapies: injectable testosterone Medication side effects:  no Medication compliance: fair compliance Decreased libido: yes Fatigue: yes Depressed mood: yes Muscle weakness: yes Erectile dysfunction: yes   Chronic knee pain slightly  exacerbated because of weight. Does well on tramadol, but needs a refill at this time.   Relevant past medical, surgical, family and social history reviewed and updated as indicated. Interim medical history since our last visit reviewed. Allergies and medications reviewed and updated.  Review of Systems  Constitutional: Negative.   Respiratory: Negative.   Cardiovascular: Negative.   Musculoskeletal: Positive for arthralgias and back pain. Negative for gait problem, joint swelling, myalgias, neck pain and neck stiffness.  Psychiatric/Behavioral: Negative.     Per HPI unless specifically indicated above     Objective:    BP 131/78 (BP Location: Left Arm, Patient Position: Sitting, Cuff Size: Normal)   Pulse 68   Temp 98.4 F (36.9 C)   Wt 278 lb 1.6 oz (126.1 kg)   SpO2 96%   BMI 43.56 kg/m   Wt Readings from Last 3 Encounters:  07/16/16 278 lb 1.6 oz (126.1 kg)  01/30/16 266 lb (120.7 kg)  12/30/15 264 lb (119.7 kg)    Physical Exam  Constitutional: He is oriented to person, place, and time. He appears well-developed and well-nourished. No distress.  HENT:  Head: Normocephalic and atraumatic.  Right Ear: Hearing and external ear normal. Tympanic membrane is injected and bulging.  Left Ear: Hearing, tympanic membrane, external ear and ear canal normal.  Nose: Nose normal.  Mouth/Throat: Oropharynx is clear and moist. No oropharyngeal exudate.  Eyes: Conjunctivae, EOM and lids are normal. Pupils are equal, round, and reactive to light. Right eye exhibits no discharge. Left eye exhibits no discharge. No scleral icterus.  Neck: Normal range of motion. Neck supple. No JVD present. No  tracheal deviation present. No thyromegaly present.  Cardiovascular: Normal rate, regular rhythm, normal heart sounds and intact distal pulses.  Exam reveals no gallop and no friction rub.   No murmur heard. Pulmonary/Chest: Effort normal and breath sounds normal. No stridor. No respiratory  distress. He has no wheezes. He has no rales. He exhibits no tenderness.  Abdominal:  Reducible umbilical hernia  Musculoskeletal: Normal range of motion.  Lymphadenopathy:    He has no cervical adenopathy.  Neurological: He is alert and oriented to person, place, and time.  Skin: Skin is warm, dry and intact. No rash noted. He is not diaphoretic. No erythema. No pallor.  Psychiatric: He has a normal mood and affect. His speech is normal and behavior is normal. Judgment and thought content normal. Cognition and memory are normal.  Nursing note and vitals reviewed.   Results for orders placed or performed in visit on [id number]  Basic metabolic panel  Result Value Ref Range   Glucose 61 (L) 65 - 99 mg/dL   BUN 29 (H) 6 - 24 mg/dL   Creatinine, Ser 1.10 0.76 - 1.27 mg/dL   GFR calc non Af Amer 74 >59 mL/min/1.73   GFR calc Af Amer 85 >59 mL/min/1.73   BUN/Creatinine Ratio 26 (H) 9 - 20   Sodium 139 134 - 144 mmol/L   Potassium 4.8 3.5 - 5.2 mmol/L   Chloride 99 96 - 106 mmol/L   CO2 25 18 - 29 mmol/L   Calcium 8.9 8.7 - 10.2 mg/dL      Assessment & Plan:   Problem List Items Addressed This Visit      Endocrine   Hypogonadism in male    Will check labs today. Await results. Will treat as needed. Patient gives himself his injections.       Relevant Orders   Testosterone, free, total     Genitourinary   Benign hypertensive renal disease - Primary    Under good control. Continue current regimen. Checking labs. Recheck at his physical.         Other   Primary osteoarthritis of both knees    Refill of his tramadol given today. Continue to work on losing weight. Call with any concerns.        Other Visit Diagnoses    Acute low back pain, unspecified back pain laterality, with sciatica presence unspecified       UA negative today. Will come in for UA on day when his back is hurting to look for possible stone.    Relevant Medications   diclofenac (VOLTAREN) 75 MG EC tablet     traMADol (ULTRAM) 50 MG tablet   Other Relevant Orders   UA/M w/rflx Culture, Routine   Immunization due       Flu shot given today.   Relevant Orders   Flu Vaccine QUAD 36+ mos PF IM (Fluarix & Fluzone Quad PF) (Completed)   Umbilical hernia without obstruction and without gangrene       Will refer to surgery for evaluation and treatment.    Relevant Orders   Ambulatory referral to General Surgery   Acute suppurative otitis media of right ear without spontaneous rupture of tympanic membrane, recurrence not specified       Will treat with amoxicillin BID x10 days. Call with any concerns.    Relevant Medications   amoxicillin (AMOXIL) 875 MG tablet       Follow up plan: Return After 10/04/16 for Physical OK to cancel November appointment.

## 2016-07-16 NOTE — Assessment & Plan Note (Signed)
Under good control. Continue current regimen. Checking labs. Recheck at his physical.

## 2016-07-16 NOTE — Patient Instructions (Addendum)

## 2016-07-16 NOTE — Assessment & Plan Note (Signed)
Refill of his tramadol given today. Continue to work on losing weight. Call with any concerns.

## 2016-07-17 LAB — UA/M W/RFLX CULTURE, ROUTINE
Bilirubin, UA: NEGATIVE
Glucose, UA: NEGATIVE
Ketones, UA: NEGATIVE
LEUKOCYTES UA: NEGATIVE
Nitrite, UA: NEGATIVE
PH UA: 5.5 (ref 5.0–7.5)
RBC, UA: NEGATIVE
Specific Gravity, UA: >1.03 — ABNORMAL HIGH (ref 1.005–1.030)
Urobilinogen, Ur: 1 mg/dL (ref 0.2–1.0)

## 2016-07-17 LAB — BASIC METABOLIC PANEL
BUN/Creatinine Ratio: 31 — ABNORMAL HIGH (ref 9–20)
BUN: 19 mg/dL (ref 6–24)
CALCIUM: 8.7 mg/dL (ref 8.7–10.2)
CO2: 25 mmol/L (ref 18–29)
CREATININE: 0.62 mg/dL — AB (ref 0.76–1.27)
Chloride: 102 mmol/L (ref 96–106)
GFR calc Af Amer: 126 mL/min/{1.73_m2} (ref >59)
GFR, EST NON AFRICAN AMERICAN: 109 mL/min/{1.73_m2} (ref >59)
Glucose: 132 mg/dL — ABNORMAL HIGH (ref 65–99)
Potassium: 4.5 mmol/L (ref 3.5–5.2)
Sodium: 143 mmol/L (ref 134–144)

## 2016-07-17 LAB — MICROALBUMIN, URINE WAIVED
CREATININE, URINE WAIVED: 300 mg/dL (ref 10–300)
Microalb, Ur Waived: 30 mg/L — ABNORMAL HIGH (ref 0–19)

## 2016-07-17 LAB — MICROSCOPIC EXAMINATION

## 2016-07-20 ENCOUNTER — Other Ambulatory Visit: Payer: Self-pay | Admitting: Family Medicine

## 2016-07-20 ENCOUNTER — Telehealth: Payer: Self-pay | Admitting: Family Medicine

## 2016-07-20 MED ORDER — TESTOSTERONE CYPIONATE 200 MG/ML IM SOLN: 200.0000 mg | INTRAMUSCULAR | 2 refills | Status: DC

## 2016-07-20 NOTE — Telephone Encounter (Signed)
Tried to call patient, no answer, unable to leave a message will try again.  

## 2016-07-20 NOTE — Telephone Encounter (Signed)
Pt would like to know if his lab results had came back and he would also like to have a fluid pill sent to [facility].

## 2016-07-20 NOTE — Telephone Encounter (Signed)
Please let him know that I am waiting on one of his testosterone tests, but his other one was low enough. He can come pick up his Rx. He's on the HCTZ for fluid pill, I don't see that he's been on anything else before. If he's having swelling, he should use the compression stockings, and if it doesn't help, he will need to be seen.

## 2016-07-20 NOTE — Telephone Encounter (Signed)
Patient notified

## 2016-07-23 LAB — TESTOSTERONE, FREE, TOTAL, SHBG
Sex Hormone Binding: 25.5 nmol/L (ref 19.3–76.4)
TESTOSTERONE FREE: 2.6 pg/mL — AB (ref 7.2–24.0)
TESTOSTERONE: 81 ng/dL — AB (ref 264–916)

## 2016-08-01 ENCOUNTER — Ambulatory Visit (INDEPENDENT_AMBULATORY_CARE_PROVIDER_SITE_OTHER): Payer: BLUE CROSS/BLUE SHIELD | Admitting: General Surgery

## 2016-08-01 ENCOUNTER — Encounter: Payer: Self-pay | Admitting: General Surgery

## 2016-08-01 VITALS — BP 130/78 | HR 74 | Resp 14 | Ht 68.0 in | Wt 273.0 lb

## 2016-08-01 DIAGNOSIS — H6690 Otitis media, unspecified, unspecified ear: Secondary | ICD-10-CM

## 2016-08-01 DIAGNOSIS — K429 Umbilical hernia without obstruction or gangrene: Secondary | ICD-10-CM | POA: Diagnosis not present

## 2016-08-01 HISTORY — DX: Otitis media, unspecified, unspecified ear: H66.90

## 2016-08-01 NOTE — Patient Instructions (Signed)
Hernia, Adult A hernia is the bulging of an organ or tissue through a weak spot in the muscles of the abdomen (abdominal wall). Hernias develop most often near the navel or groin. There are many kinds of hernias. Common kinds include:  Femoral hernia. This kind of hernia develops under the groin in the upper thigh area.  Inguinal hernia. This kind of hernia develops in the groin or scrotum.  Umbilical hernia. This kind of hernia develops near the navel.  Hiatal hernia. This kind of hernia causes part of the stomach to be pushed up into the chest.  Incisional hernia. This kind of hernia bulges through a scar from an abdominal surgery. CAUSES This condition may be caused by:  Heavy lifting.  Coughing over a long period of time.  Straining to have a bowel movement.  An incision made during an abdominal surgery.  A birth defect (congenital defect).  Excess weight or obesity.  Smoking.  Poor nutrition.  Cystic fibrosis.  Excess fluid in the abdomen.  Undescended testicles. SYMPTOMS Symptoms of a hernia include:  A lump on the abdomen. This is the first sign of a hernia. The lump may become more obvious with standing, straining, or coughing. It may get bigger over time if it is not treated or if the condition causing it is not treated.  Pain. A hernia is usually painless, but it may become painful over time if treatment is delayed. The pain is usually dull and may get worse with standing or lifting heavy objects. Sometimes a hernia gets tightly squeezed in the weak spot (strangulated) or stuck there (incarcerated) and causes additional symptoms. These symptoms may include:  Vomiting.  Nausea.  Constipation.  Irritability. DIAGNOSIS A hernia may be diagnosed with:  A physical exam. During the exam your health care provider may ask you to cough or to make a specific movement, because a hernia is usually more visible when you move.  Imaging tests. These can  include:  X-rays.  Ultrasound.  CT scan. TREATMENT A hernia that is small and painless may not need to be treated. A hernia that is large or painful may be treated with surgery. Inguinal hernias may be treated with surgery to prevent incarceration or strangulation. Strangulated hernias are always treated with surgery, because lack of blood to the trapped organ or tissue can cause it to die. Surgery to treat a hernia involves pushing the bulge back into place and repairing the weak part of the abdomen. HOME CARE INSTRUCTIONS  Avoid straining.  Do not lift anything heavier than 10 lb (4.5 kg).  Lift with your leg muscles, not your back muscles. This helps avoid strain.  When coughing, try to cough gently.  Prevent constipation. Constipation leads to straining with bowel movements, which can make a hernia worse or cause a hernia repair to break down. You can prevent constipation by:  Eating a high-fiber diet that includes plenty of fruits and vegetables.  Drinking enough fluids to keep your urine clear or pale yellow. Aim to drink 6-8 glasses of water per day.  Using a stool softener as directed by your health care provider.  Lose weight, if you are overweight.  Do not use any tobacco products, including cigarettes, chewing tobacco, or electronic cigarettes. If you need help quitting, ask your health care provider.  Keep all follow-up visits as directed by your health care provider. This is important. Your health care provider may need to monitor your condition. SEEK MEDICAL CARE IF:  You have   swelling, redness, and pain in the affected area.  Your bowel habits change. SEEK IMMEDIATE MEDICAL CARE IF:  You have a fever.  You have abdominal pain that is getting worse.  You feel nauseous or you vomit.  You cannot push the hernia back in place by gently pressing on it while you are lying down.  The hernia:  Changes in shape or size.  Is stuck outside the  abdomen.  Becomes discolored.  Feels hard or tender.   This information is not intended to replace advice given to you by your health care provider. Make sure you discuss any questions you have with your health care provider.   Document Released: 09/17/2005 Document Revised: 10/08/2014 Document Reviewed: 07/28/2014 Elsevier Interactive Patient Education 2016 [employer]  

## 2016-08-01 NOTE — Progress Notes (Signed)
Patient ID: Andrew [name], male   DOB: October 06, 1955, 60 y.o.   MRN: [id number]  Chief Complaint  Patient presents with  . Other    umbilical hernia    HPI Andrew [name] is a 60 y.o. male here today for a evaluation of a umbilical hernia. Patient states he noticed this area 20 year ago. He states in the last five years the area has got bigger. So day the area is painful with activity. Wife Neoma Laming is present. HPI  Past Medical History:  Diagnosis Date  . Chronic pain syndrome   . Depression   . Diverticulosis   . DVT (deep venous thrombosis) (HCC)    bilateral, was on anticoags for 29months  . Hernia, umbilical   . Hyperlipidemia   . Hypertension   . IFG (impaired fasting glucose)   . Knee pain, chronic   . Pes anserine bursitis     Past Surgical History:  Procedure Laterality Date  . COLONOSCOPY  2015  . JOINT REPLACEMENT Bilateral 2009   replacement and revision  . KIDNEY STONE SURGERY      Family History  Problem Relation Age of Onset  . Alcohol abuse Father   . Hypertension Father   . Cirrhosis Father   . Hypertension Brother   . Stroke Paternal Grandmother   . Stroke Paternal Grandfather   . Pneumonia Mother     Social History Social History  Substance Use Topics  . Smoking status: Former Smoker    Quit date: 06/28/2011  . Smokeless tobacco: Never Used  . Alcohol use Yes     Comment: on occasion    No Known Allergies  Current Outpatient Prescriptions  Medication Sig Dispense Refill  . benazepril (LOTENSIN) 40 MG tablet Take 1 tablet (40 mg total) by mouth daily. 90 tablet 1  . diclofenac (VOLTAREN) 75 MG EC tablet Take 75 mg by mouth 2 (two) times daily.    . hydrochlorothiazide (MICROZIDE) 12.5 MG capsule Take 1 capsule (12.5 mg total) by mouth daily. 90 capsule 1  . metoprolol succinate (TOPROL-XL) 100 MG 24 hr tablet TAKE 1 TABLET BY MOUTH  DAILY WITH OR IMMEDIATELY  FOLLOWING A MEAL 90 tablet 1  . testosterone cypionate (DEPOTESTOSTERONE CYPIONATE)  200 MG/ML injection Inject 1 mL (200 mg total) into the muscle every 14 (fourteen) days. 10 mL 2   No current facility-administered medications for this visit.     Review of Systems Review of Systems  Constitutional: Negative.   Respiratory: Negative.   Cardiovascular: Negative.     Blood pressure 130/78, pulse 74, resp. rate 14, height 5\' 8"  (1.727 m), weight 273 lb (123.8 kg).  Physical Exam Physical Exam  Constitutional: He is oriented to person, place, and time. He appears well-developed and well-nourished.  Eyes: Conjunctivae are normal. No scleral icterus.  Neck: Neck supple.  Cardiovascular: Normal rate, regular rhythm and normal heart sounds.   Pulmonary/Chest: Effort normal and breath sounds normal.  Abdominal: Soft. Normal appearance and bowel sounds are normal. There is no hepatomegaly. There is no tenderness. A hernia (umbilical hernia) is present.    Neurological: He is alert and oriented to person, place, and time.  Skin: Skin is warm and dry.    Data Reviewed PCP notes of 07/16/2016.  Basic metabolic panel of the same date showed a creatinine of 0.6 with modest elevation of blood sugar 132. Normal electrolytes.  CBC of 10/05/2015 showed a hemoglobin of 17.8 with MCV of 93, platelet count of 138,000, white  blood cell count of 8500.  Assessment    Enlarging umbilical hernia. Modestly depressed serum platelet count,  elevated hemoglobin    Plan        Hernia precautions and incarceration were discussed with the patient. If they develop symptoms of an incarcerated hernia, they were encouraged to seek prompt medical attention.  I have recommended repair of the hernia  on an outpatient basis in the near future. The risk of infection was reviewed. The role of prosthetic mesh If the fascial defect was larger than clinically apparent to minimize the risk of recurrence was reviewed.  Patient's surgery has been scheduled for 09-06-16 at [facility]. It is okay for patient  to continue Voltaren. The history and physical will be updated the morning of surgery.   This information has been scribed by Gaspar Cola CMA.   Robert Bellow 08/02/2016, 6:32 AM

## 2016-08-02 DIAGNOSIS — K429 Umbilical hernia without obstruction or gangrene: Secondary | ICD-10-CM | POA: Insufficient documentation

## 2016-08-07 ENCOUNTER — Telehealth: Payer: Self-pay | Admitting: Family Medicine

## 2016-08-07 DIAGNOSIS — E291 Testicular hypofunction: Secondary | ICD-10-CM

## 2016-08-07 NOTE — Telephone Encounter (Signed)
-----   Message from Jerene Pitch, [place] sent at 08/07/2016 11:35 AM EST ----- Patient will check his denial paper to see if there is alternates, he will come in to have labs repeated. Please order tetsosterone ----- Message ----- From: Valerie Roys, DO Sent: 07/31/2016  11:44 AM To: Tiffany L Reel, CMA  Can we please start a PA on this, we may have to get the records from his urologist of his previous low testosterone levels.  ----- Message ----- From: Tula Nakayama Sent: 07/31/2016  11:09 AM To: Valerie Roys, DO  Denial of medication

## 2016-08-09 ENCOUNTER — Other Ambulatory Visit: Payer: BLUE CROSS/BLUE SHIELD

## 2016-08-09 DIAGNOSIS — E291 Testicular hypofunction: Secondary | ICD-10-CM

## 2016-08-10 LAB — TESTOSTERONE, FREE, TOTAL, SHBG
Sex Hormone Binding: 27.5 nmol/L (ref 19.3–76.4)
Testosterone, Free: 5.7 pg/mL — ABNORMAL LOW (ref 7.2–24.0)
Testosterone: 124 ng/dL — ABNORMAL LOW (ref 264–916)

## 2016-08-13 ENCOUNTER — Ambulatory Visit: Payer: BLUE CROSS/BLUE SHIELD | Admitting: Family Medicine

## 2016-08-14 ENCOUNTER — Other Ambulatory Visit: Payer: Self-pay | Admitting: Family Medicine

## 2016-08-15 ENCOUNTER — Encounter: Payer: Self-pay | Admitting: Family Medicine

## 2016-08-15 ENCOUNTER — Ambulatory Visit (INDEPENDENT_AMBULATORY_CARE_PROVIDER_SITE_OTHER): Payer: BLUE CROSS/BLUE SHIELD | Admitting: Family Medicine

## 2016-08-15 ENCOUNTER — Telehealth: Payer: Self-pay | Admitting: Family Medicine

## 2016-08-15 VITALS — BP 102/65 | HR 67 | Temp 98.1°F | Wt 273.0 lb

## 2016-08-15 DIAGNOSIS — H66001 Acute suppurative otitis media without spontaneous rupture of ear drum, right ear: Secondary | ICD-10-CM

## 2016-08-15 DIAGNOSIS — M25511 Pain in right shoulder: Secondary | ICD-10-CM

## 2016-08-15 DIAGNOSIS — I129 Hypertensive chronic kidney disease with stage 1 through stage 4 chronic kidney disease, or unspecified chronic kidney disease: Secondary | ICD-10-CM | POA: Diagnosis not present

## 2016-08-15 MED ORDER — SULFAMETHOXAZOLE-TRIMETHOPRIM 800-160 MG PO TABS: 1.0000 | ORAL_TABLET | Freq: Two times a day (BID) | ORAL | 0 refills | Status: DC

## 2016-08-15 MED ORDER — FLUTICASONE PROPIONATE 50 MCG/ACT NA SUSP: 2.0000 | Freq: Every day | NASAL | 6 refills | Status: DC

## 2016-08-15 MED ORDER — METOPROLOL SUCCINATE ER 100 MG PO TB24: ORAL_TABLET | ORAL | 0 refills | Status: DC

## 2016-08-15 MED ORDER — BENAZEPRIL HCL 40 MG PO TABS: 40.0000 mg | ORAL_TABLET | Freq: Every day | ORAL | 0 refills | Status: DC

## 2016-08-15 MED ORDER — PREDNISONE 20 MG PO TABS: 40.0000 mg | ORAL_TABLET | Freq: Every day | ORAL | 0 refills | Status: DC

## 2016-08-15 NOTE — Telephone Encounter (Signed)
Patient notified that medication had been sent to mail order, patient will check with them.

## 2016-08-15 NOTE — Telephone Encounter (Signed)
Pt called stated his pharmacy informed him he could not get his medications refilled. Pt stated he is out of Metoprolol, Benazepril. [facility] [address] [facility] [place]. Thanks.

## 2016-08-15 NOTE — Assessment & Plan Note (Signed)
Well controlled. 30 day supplies of [facility]azepril and [facility] until he can appeal mail order supplies that were lost in transit

## 2016-08-15 NOTE — Patient Instructions (Signed)
Follow up as needed

## 2016-08-15 NOTE — Progress Notes (Addendum)
BP 102/65   Pulse 67   Temp 98.1 F (36.7 C)   Wt 273 lb (123.8 kg)   SpO2 97%   BMI 41.51 kg/m    Subjective:    Patient ID: Andrew [name], male    DOB: 08-Nov-1955, 60 y.o.   MRN: [id number]  HPI: Andrew [name] is a 60 y.o. male  Chief Complaint  Patient presents with  . Ear Fullness    x 1 week, right ear, pressure. No runny nose, no sinus congestion, no fever, no cough, no sore throat.   Patient presents for follow up of right otitis media. Was given 10 days of amoxicillin 4 weeks ago with good relief of ear pain, but over the past week the pain, pressure, and fullness has returned. No fever or new URI symptoms noted. Has not been taking anything OTC for sxs.   Also notes intermittent right shoulder pain after rest. 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 The pain is noted when sitting still in his chair for long periods. Sometimes numbness in upper arm. Has had this off and on for several years. Has had full cardiac work-up for sxs, negative results. Tramadol prn helps.   States his BP med order from his mail order pharmacy was lost and he is in the middle of appealing it right now. Needs a temporary supply sent [facility] until he can get that straightened out.   Past Medical History:  Diagnosis Date  . Chronic pain syndrome   . Depression   . Diverticulosis   . DVT (deep venous thrombosis) (HCC)    bilateral, was on anticoags for 98months  . Hernia, umbilical   . Hyperlipidemia   . Hypertension   . IFG (impaired fasting glucose)   . Knee pain, chronic   . Pes anserine bursitis    Social History   Social History  . Marital status: Married    Spouse name: N/A  . Number of children: N/A  . Years of education: N/A   Occupational History  . Not on file.   Social History Main Topics  . Smoking status: Former Smoker    Quit date: 06/28/2011  . Smokeless tobacco: Never Used  . Alcohol use Yes     Comment: on  occasion  . Drug use: No  . Sexual activity: Yes    Birth control/ protection: None   Other Topics Concern  . Not on file   Social History Narrative  . No narrative on file    Relevant past medical, surgical, family and social history reviewed and updated as indicated. Interim medical history since our last visit reviewed. Allergies and medications reviewed and updated.  Review of Systems  Constitutional: Negative.   HENT: Positive for ear pain.   Eyes: Negative.   Respiratory: Negative.   Cardiovascular: Negative.   Gastrointestinal: Negative.   Genitourinary: Negative.   Musculoskeletal: Positive for arthralgias.  Skin: Negative.   Neurological: Positive for numbness.  Psychiatric/Behavioral: Negative.     Per HPI unless specifically indicated above     Objective:    BP 102/65   Pulse 67   Temp 98.1 F (36.7 C)   Wt 273 lb (123.8 kg)   SpO2 97%   BMI 41.51 kg/m   Wt Readings from Last 3 Encounters:  08/15/16 273 lb (123.8 kg)  08/01/16 273 lb (123.8 kg)  07/16/16 278 lb 1.6 oz (126.1 kg)  Physical Exam  Constitutional: He is oriented to person, place, and time. He appears well-developed and well-nourished. No distress.  HENT:  Head: Atraumatic.  Left Ear: External ear normal.  Nose: Nose normal.  Mouth/Throat: Oropharynx is clear and moist. No oropharyngeal exudate.  Right TM erythematous and edematous  Eyes: Conjunctivae and EOM are normal. Pupils are equal, round, and reactive to light. No scleral icterus.  Neck: Normal range of motion. Neck supple.  Cardiovascular: Normal rate and normal heart sounds.   Pulmonary/Chest: Effort normal and breath sounds normal. No respiratory distress.  Musculoskeletal: Normal range of motion. He exhibits no edema, tenderness or deformity.  Strength full and equal b/l UEs   Lymphadenopathy:    He has no cervical adenopathy.  Neurological: He is alert and oriented to person, place, and time.  Neurovascularly intact  in b/l UEs  Skin: Skin is warm and dry.  Psychiatric: He has a normal mood and affect. His behavior is normal.  Nursing note and vitals reviewed.     Assessment & Plan:   Problem List Items Addressed This Visit      Genitourinary   Benign hypertensive renal disease    Well controlled. 30 day supplies of benazepril and [facility]rolol [facility] until he can appeal mail order supplies that were lost in transit       Other Visit Diagnoses    Acute suppurative otitis media of right ear without spontaneous rupture of tympanic membrane, recurrence not specified    -  Primary   Bactrim and flonase sent, follow up if no improvement by end of week   Relevant Medications   sulfamethoxazole-trimethoprim (BACTRIM DS,SEPTRA DS) 800-160 MG tablet   Acute pain of right shoulder       Likely arthritic. Prednisone burst sent, has orthopedist that he will go see in the next month or so if no improvement. Continue tramadol prn for pain       Follow up plan: Return if symptoms worsen or fail to improve.

## 2016-08-28 NOTE — Patient Instructions (Signed)
  Your procedure is scheduled on: 09-06-16 [facility]) Report to Same Day Surgery 2nd floor medical mall [facility] Entrance-take elevator on left to 2nd floor.  Check in with surgery information desk.) To find out your arrival time please call [phone] between 1PM - 3PM on 09-05-16 [facility])  Remember: Instructions that are not followed completely may result in serious medical risk, up to and including death, or upon the discretion of your surgeon and anesthesiologist your surgery may need to be rescheduled.    _x___ 1. Do not eat food or drink liquids after midnight. No gum chewing or hard candies.     __x__ 2. No Alcohol for 24 hours before or after surgery.   __x__3. No Smoking for 24 prior to surgery.   ____  4. Bring all medications with you on the day of surgery if instructed.    __x__ 5. Notify your doctor if there is any change in your medical condition     (cold, fever, infections).     Do not wear jewelry, make-up, hairpins, clips or nail polish.  Do not wear lotions, powders, or perfumes. You may wear deodorant.  Do not shave 48 hours prior to surgery. Men may shave face and neck.  Do not bring valuables to the hospital.    [facility] is not responsible for any belongings or valuables.               Contacts, dentures or bridgework may not be worn into surgery.  Leave your suitcase in the car. After surgery it may be brought to your room.  For patients admitted to the hospital, discharge time is determined by your treatment team.   Patients discharged the day of surgery will not be allowed to drive home.  You will need someone to drive you home and stay with you the night of your procedure.    Please read over the following fact sheets that you were given:   [facility] Preparing for Surgery and or MRSA Information   _x___ Take these medicines the morning of surgery with A SIP OF WATER:    1. METOPROLOL  2. BENAZEPRIL  3.  4.  5.  6.  ____Fleets enema or  Magnesium Citrate as directed.   _x___ Use CHG Soap or sage wipes as directed on instruction sheet   ____ Use inhalers on the day of surgery and bring to hospital day of surgery  ____ Stop metformin 2 days prior to surgery    ____ Take 1/2 of usual insulin dose the night before surgery and none on the morning of  surgery.   ____ Stop Aspirin, Coumadin, Pllavix ,Eliquis, Effient, or Pradaxa  __     Stop Anti-inflammatories such as Advil, Aleve, Ibuprofen, Motrin, Naproxen,          Naprosyn, Goodies powders or aspirin products. Ok to take Tylenol.   ____ Stop supplements until after surgery.    ____ Bring C-Pap to the hospital.

## 2016-08-30 ENCOUNTER — Encounter
Admission: RE | Admit: 2016-08-30 | Discharge: 2016-08-30 | Disposition: A | Discharge status: Home / Self Care | Payer: BLUE CROSS/BLUE SHIELD | Source: Ambulatory Visit | Attending: General Surgery | Admitting: General Surgery

## 2016-08-30 ENCOUNTER — Other Ambulatory Visit: Payer: BLUE CROSS/BLUE SHIELD

## 2016-08-30 DIAGNOSIS — I10 Essential (primary) hypertension: Secondary | ICD-10-CM | POA: Insufficient documentation

## 2016-08-30 DIAGNOSIS — Z01818 Encounter for other preprocedural examination: Secondary | ICD-10-CM | POA: Diagnosis not present

## 2016-08-30 HISTORY — DX: Family history of other specified conditions: Z84.89

## 2016-08-30 HISTORY — DX: Otitis media, unspecified, unspecified ear: H66.90

## 2016-08-30 HISTORY — DX: Personal history of urinary calculi: Z87.442

## 2016-08-30 LAB — CBC WITH DIFFERENTIAL/PLATELET
Basophils Absolute: 0.1 10*3/uL (ref 0–0.1)
Basophils Relative: 1 %
EOS ABS: 0.4 10*3/uL (ref 0–0.7)
EOS PCT: 4 %
HCT: 41.4 % (ref 40.0–52.0)
Hemoglobin: 14.4 g/dL (ref 13.0–18.0)
LYMPHS ABS: 1.4 10*3/uL (ref 1.0–3.6)
LYMPHS PCT: 17 %
MCH: 31.9 pg (ref 26.0–34.0)
MCHC: 34.8 g/dL (ref 32.0–36.0)
MCV: 91.7 fL (ref 80.0–100.0)
MONO ABS: 0.7 10*3/uL (ref 0.2–1.0)
Monocytes Relative: 8 %
Neutro Abs: 5.7 10*3/uL (ref 1.4–6.5)
Neutrophils Relative %: 70 %
PLATELETS: 145 10*3/uL — AB (ref 150–440)
RBC: 4.52 MIL/uL (ref 4.40–5.90)
RDW: 14.1 % (ref 11.5–14.5)
WBC: 8.2 10*3/uL (ref 3.8–10.6)

## 2016-08-30 LAB — POTASSIUM: Potassium: 4.7 mmol/L (ref 3.5–5.1)

## 2016-09-06 ENCOUNTER — Encounter: Payer: Self-pay | Admitting: *Deleted

## 2016-09-06 ENCOUNTER — Ambulatory Visit: Payer: BLUE CROSS/BLUE SHIELD | Admitting: Registered Nurse

## 2016-09-06 ENCOUNTER — Ambulatory Visit
Admission: RE | Admit: 2016-09-06 | Discharge: 2016-09-06 | Disposition: A | Discharge status: Home / Self Care | Payer: BLUE CROSS/BLUE SHIELD | Source: Ambulatory Visit | Attending: General Surgery | Admitting: General Surgery

## 2016-09-06 ENCOUNTER — Encounter
Admission: RE | Disposition: A | Discharge status: Home / Self Care | Payer: Self-pay | Source: Ambulatory Visit | Attending: General Surgery

## 2016-09-06 DIAGNOSIS — Z87891 Personal history of nicotine dependence: Secondary | ICD-10-CM | POA: Diagnosis not present

## 2016-09-06 DIAGNOSIS — Z86718 Personal history of other venous thrombosis and embolism: Secondary | ICD-10-CM | POA: Insufficient documentation

## 2016-09-06 DIAGNOSIS — E785 Hyperlipidemia, unspecified: Secondary | ICD-10-CM | POA: Diagnosis not present

## 2016-09-06 DIAGNOSIS — Z6841 Body Mass Index (BMI) 40.0 and over, adult: Secondary | ICD-10-CM | POA: Insufficient documentation

## 2016-09-06 DIAGNOSIS — G894 Chronic pain syndrome: Secondary | ICD-10-CM | POA: Insufficient documentation

## 2016-09-06 DIAGNOSIS — K429 Umbilical hernia without obstruction or gangrene: Secondary | ICD-10-CM

## 2016-09-06 DIAGNOSIS — I10 Essential (primary) hypertension: Secondary | ICD-10-CM | POA: Diagnosis not present

## 2016-09-06 DIAGNOSIS — K420 Umbilical hernia with obstruction, without gangrene: Secondary | ICD-10-CM | POA: Diagnosis not present

## 2016-09-06 HISTORY — PX: UMBILICAL HERNIA REPAIR: SHX196

## 2016-09-06 SURGERY — REPAIR, HERNIA, UMBILICAL, ADULT
Anesthesia: General | Wound class: Clean

## 2016-09-06 MED ORDER — PROPOFOL 10 MG/ML IV BOLUS
INTRAVENOUS | Status: DC | PRN
  Administered 2016-09-06: 250 mg via INTRAVENOUS

## 2016-09-06 MED ORDER — FENTANYL CITRATE (PF) 100 MCG/2ML IJ SOLN
INTRAMUSCULAR | Status: DC | PRN
  Administered 2016-09-06 (×2): 50 ug via INTRAVENOUS

## 2016-09-06 MED ORDER — LACTATED RINGERS IV SOLN
INTRAVENOUS | Status: DC
  Administered 2016-09-06: 10:00:00 via INTRAVENOUS

## 2016-09-06 MED ORDER — DEXAMETHASONE SODIUM PHOSPHATE 10 MG/ML IJ SOLN
INTRAMUSCULAR | Status: DC | PRN
  Administered 2016-09-06: 4 mg via INTRAVENOUS

## 2016-09-06 MED ORDER — ONDANSETRON HCL 4 MG/2ML IJ SOLN
INTRAMUSCULAR | Status: DC | PRN
Start: 2016-09-06 — End: 2016-09-06
  Administered 2016-09-06: 4 mg via INTRAVENOUS

## 2016-09-06 MED ORDER — LIDOCAINE HCL (CARDIAC) 20 MG/ML IV SOLN
INTRAVENOUS | Status: DC | PRN
  Administered 2016-09-06: 100 mg via INTRAVENOUS

## 2016-09-06 MED ORDER — FAMOTIDINE 20 MG PO TABS
20.0000 mg | ORAL_TABLET | Freq: Once | ORAL | Status: AC
  Administered 2016-09-06: 20 mg via ORAL

## 2016-09-06 MED ORDER — MIDAZOLAM HCL 2 MG/2ML IJ SOLN
INTRAMUSCULAR | Status: DC | PRN
  Administered 2016-09-06: 2 mg via INTRAVENOUS

## 2016-09-06 MED ORDER — FENTANYL CITRATE (PF) 100 MCG/2ML IJ SOLN
INTRAMUSCULAR | Status: AC
  Administered 2016-09-06: 25 ug via INTRAVENOUS
  Filled 2016-09-06: qty 2

## 2016-09-06 MED ORDER — ACETAMINOPHEN 10 MG/ML IV SOLN
INTRAVENOUS | Status: AC
  Filled 2016-09-06: qty 100

## 2016-09-06 MED ORDER — ACETAMINOPHEN 10 MG/ML IV SOLN
INTRAVENOUS | Status: DC | PRN
  Administered 2016-09-06: 1000 mg via INTRAVENOUS

## 2016-09-06 MED ORDER — FENTANYL CITRATE (PF) 100 MCG/2ML IJ SOLN
25.0000 ug | INTRAMUSCULAR | Status: AC | PRN
  Administered 2016-09-06 (×6): 25 ug via INTRAVENOUS

## 2016-09-06 MED ORDER — GLYCOPYRROLATE 0.2 MG/ML IJ SOLN
INTRAMUSCULAR | Status: DC | PRN
  Administered 2016-09-06: 0.2 mg via INTRAVENOUS

## 2016-09-06 MED ORDER — BUPIVACAINE HCL 0.5 % IJ SOLN
INTRAMUSCULAR | Status: DC | PRN
  Administered 2016-09-06: 30 mL

## 2016-09-06 MED ORDER — DEXTROSE 5 % IV SOLN
3.0000 g | INTRAVENOUS | Status: AC
  Administered 2016-09-06: 3 g via INTRAVENOUS
  Filled 2016-09-06: qty 3000

## 2016-09-06 MED ORDER — FAMOTIDINE 20 MG PO TABS
ORAL_TABLET | ORAL | Status: AC
  Filled 2016-09-06: qty 1

## 2016-09-06 MED ORDER — ONDANSETRON HCL 4 MG/2ML IJ SOLN: 4.0000 mg | Freq: Once | INTRAMUSCULAR | Status: DC | PRN

## 2016-09-06 MED ORDER — BUPIVACAINE HCL (PF) 0.5 % IJ SOLN
INTRAMUSCULAR | Status: AC
  Filled 2016-09-06: qty 30

## 2016-09-06 SURGICAL SUPPLY — 32 items
BLADE SURG 15 STRL SS SAFETY (BLADE) ×3 IMPLANT
CANISTER SUCT 1200ML W/VALVE (MISCELLANEOUS) ×3 IMPLANT
CHLORAPREP W/TINT 26ML (MISCELLANEOUS) ×3 IMPLANT
CLOSURE WOUND 1/2 X4 (GAUZE/BANDAGES/DRESSINGS) ×1
DRAPE LAPAROTOMY 100X77 ABD (DRAPES) ×3 IMPLANT
DRESSING TELFA 4X3 1S ST N-ADH (GAUZE/BANDAGES/DRESSINGS) ×3 IMPLANT
DRSG TEGADERM 4X4.75 (GAUZE/BANDAGES/DRESSINGS) ×3 IMPLANT
ELECT REM PT RETURN 9FT ADLT (ELECTROSURGICAL) ×3
ELECTRODE REM PT RTRN 9FT ADLT (ELECTROSURGICAL) ×1 IMPLANT
GLOVE BIO SURGEON STRL SZ7.5 (GLOVE) ×3 IMPLANT
GLOVE INDICATOR 8.0 STRL GRN (GLOVE) ×3 IMPLANT
GOWN STRL REUS W/ TWL LRG LVL3 (GOWN DISPOSABLE) ×2 IMPLANT
GOWN STRL REUS W/TWL LRG LVL3 (GOWN DISPOSABLE) ×6
KIT RM TURNOVER STRD PROC AR (KITS) ×3 IMPLANT
LABEL OR SOLS (LABEL) ×3 IMPLANT
MESH VENTRALEX ST 8CM LRG (Mesh General) ×2 IMPLANT
NDL HYPO 25X1 1.5 SAFETY (NEEDLE) ×1 IMPLANT
NDL SAFETY 22GX1.5 (NEEDLE) ×6 IMPLANT
NEEDLE HYPO 25X1 1.5 SAFETY (NEEDLE) ×3 IMPLANT
NS IRRIG 500ML POUR BTL (IV SOLUTION) ×3 IMPLANT
PACK BASIN MINOR ARMC (MISCELLANEOUS) ×3 IMPLANT
STRIP CLOSURE SKIN 1/2X4 (GAUZE/BANDAGES/DRESSINGS) ×2 IMPLANT
SUT PROLENE 0 CT 1 30 (SUTURE) ×3 IMPLANT
SUT SURGILON 0 BLK (SUTURE) ×6 IMPLANT
SUT VIC AB 3-0 54X BRD REEL (SUTURE) ×2 IMPLANT
SUT VIC AB 3-0 BRD 54 (SUTURE) ×6
SUT VIC AB 3-0 SH 27 (SUTURE) ×3
SUT VIC AB 3-0 SH 27X BRD (SUTURE) ×1 IMPLANT
SUT VIC AB 4-0 FS2 27 (SUTURE) ×3 IMPLANT
SWABSTK COMLB BENZOIN TINCTURE (MISCELLANEOUS) ×3 IMPLANT
SYR 3ML LL SCALE MARK (SYRINGE) ×3 IMPLANT
SYR CONTROL 10ML (SYRINGE) ×6 IMPLANT

## 2016-09-06 NOTE — H&P (Signed)
Andrew [name] [id number] Feb 17, 1956     HPI: A 60 year old male with long-standing umbilical hernia. For elective repair.  Prescriptions Prior to Admission  Medication Sig Dispense Refill Last Dose  . benazepril (LOTENSIN) 40 MG tablet Take 1 tablet (40 mg total) by mouth daily. (Patient taking differently: Take 40 mg by mouth every morning. ) 30 tablet 0 09/06/2016 at 0800  . diclofenac (VOLTAREN) 75 MG EC tablet TAKE 1 TABLET BY MOUTH TWO  TIMES DAILY 180 tablet 1 09/05/2016 at Unknown time  . fluticasone (FLONASE) 50 MCG/ACT nasal spray Place 2 sprays into both nostrils daily. 16 g 6 Past Week at Unknown time  . hydrochlorothiazide (MICROZIDE) 12.5 MG capsule Take 1 capsule (12.5 mg total) by mouth daily. 90 capsule 1 09/05/2016 at Unknown time  . metoprolol succinate (TOPROL-XL) 100 MG 24 hr tablet TAKE 1 TABLET BY MOUTH  DAILY WITH OR IMMEDIATELY  FOLLOWING A MEAL (Patient taking differently: Take 100 mg by mouth every morning. TAKE 1 TABLET BY MOUTH  DAILY WITH OR IMMEDIATELY  FOLLOWING A MEAL) 30 tablet 0 09/06/2016 at 0800  . traMADol (ULTRAM) 50 MG tablet Take 50-100 mg by mouth 2 (two) times daily. Takes 100 mg in the morning and 50 mg in the evening  4 09/05/2016 at Unknown time  . predniSONE (DELTASONE) 20 MG tablet Take 2 tablets (40 mg total) by mouth daily with breakfast. (Patient not taking: Reported on 08/20/2016) 10 tablet 0 Not Taking at Unknown time  . sulfamethoxazole-trimethoprim (BACTRIM DS,SEPTRA DS) 800-160 MG tablet Take 1 tablet by mouth 2 (two) times daily. (Patient not taking: Reported on 09/06/2016) 14 tablet 0 Completed Course at Unknown time  . testosterone cypionate (DEPOTESTOSTERONE CYPIONATE) 200 MG/ML injection Inject 1 mL (200 mg total) into the muscle every 14 (fourteen) days. (Patient not taking: Reported on 08/15/2016) 10 mL 2 Not Taking   No Known Allergies Past Medical History:  Diagnosis Date  . Chronic pain syndrome   . Depression   . Diverticulosis   .  DVT (deep venous thrombosis) (HCC)    bilateral, was on anticoags for 47months  . Ear infection 08/2016   CLEARING UP-FINISHED ANTIBIOTIC AND PREDNISONE TAPER   . Family history of adverse reaction to anesthesia    BROTHER-N/V  . Hernia, umbilical   . History of kidney stones   . Hyperlipidemia   . Hypertension   . IFG (impaired fasting glucose)   . Knee pain, chronic   . Pes anserine bursitis    Past Surgical History:  Procedure Laterality Date  . COLONOSCOPY  2015  . JOINT REPLACEMENT Bilateral 2009   replacement and revision  . KIDNEY STONE SURGERY     Social History   Social History  . Marital status: Married    Spouse name: N/A  . Number of children: N/A  . Years of education: N/A   Occupational History  . Not on file.   Social History Main Topics  . Smoking status: Former Smoker    Packs/day: 1.50    Years: 25.00    Types: Cigarettes    Quit date: 06/28/2011  . Smokeless tobacco: Never Used  . Alcohol use Yes     Comment: on occasion  . Drug use: No  . Sexual activity: Yes    Birth control/ protection: None   Other Topics Concern  . Not on file   Social History Narrative  . No narrative on file   Social History   Social History Narrative  .  No narrative on file     ROS: Negative.     PE: HEENT: Negative. Lungs: Clear. Cardio: RR. Abdomen: Sizable umbilical hernia.    Assessment/Plan:  Proceed with planned umbilical hernia repair.Robert Bellow 09/06/2016

## 2016-09-06 NOTE — Discharge Instructions (Signed)
AMBULATORY SURGERY  °DISCHARGE INSTRUCTIONS ° ° °1) The drugs that you were given will stay in your system until tomorrow so for the next 24 hours you should not: ° °A) Drive an automobile °B) Make any legal decisions °C) Drink any alcoholic beverage ° ° °2) You may resume regular meals tomorrow.  Today it is better to start with liquids and gradually work up to solid foods. ° °You may eat anything you prefer, but it is better to start with liquids, then soup and crackers, and gradually work up to solid foods. ° ° °3) Please notify your doctor immediately if you have any unusual bleeding, trouble breathing, redness and pain at the surgery site, drainage, fever, or pain not relieved by medication. ° ° ° °4) Additional Instructions: ° ° ° ° ° ° ° °Please contact your physician with any problems or Same Day Surgery at [phone], Monday through Friday 6 am to 4 pm, or [facility] [place] Main number at [phone].AMBULATORY SURGERY  °DISCHARGE INSTRUCTIONS ° ° °5) The drugs that you were given will stay in your system until tomorrow so for the next 24 hours you should not: ° °D) Drive an automobile °E) Make any legal decisions °F) Drink any alcoholic beverage ° ° °6) You may resume regular meals tomorrow.  Today it is better to start with liquids and gradually work up to solid foods. ° °You may eat anything you prefer, but it is better to start with liquids, then soup and crackers, and gradually work up to solid foods. ° ° °7) Please notify your doctor immediately if you have any unusual bleeding, trouble breathing, redness and pain at the surgery site, drainage, fever, or pain not relieved by medication. ° ° ° °8) Additional Instructions: ° ° ° ° ° ° ° °Please contact your physician with any problems or Same Day Surgery at [phone], Monday through Friday 6 am to 4 pm, or [facility] [place] Main number at [phone]. °

## 2016-09-06 NOTE — Progress Notes (Signed)
Ice to incision

## 2016-09-06 NOTE — Anesthesia Preprocedure Evaluation (Signed)
Anesthesia Evaluation  Patient identified by MRN, date of birth, ID band Patient awake    Reviewed: Allergy & Precautions, H&P , NPO status , Patient's Chart, lab work & pertinent test results, reviewed documented beta blocker date and time   Airway Mallampati: II  TM Distance: >3 FB Neck ROM: full    Dental  (+) Teeth Intact   Pulmonary neg pulmonary ROS, former smoker,    Pulmonary exam normal        Cardiovascular Exercise Tolerance: Good hypertension, negative cardio ROS Normal cardiovascular exam Rate:Normal     Neuro/Psych PSYCHIATRIC DISORDERS negative neurological ROS  negative psych ROS   GI/Hepatic negative GI ROS, Neg liver ROS,   Endo/Other  negative endocrine ROSMorbid obesity  Renal/GU Renal diseasenegative Renal ROS  negative genitourinary   Musculoskeletal   Abdominal   Peds  Hematology negative hematology ROS (+)   Anesthesia Other Findings   Reproductive/Obstetrics negative OB ROS                             Anesthesia Physical Anesthesia Plan  ASA: III  Anesthesia Plan: General LMA   Post-op Pain Management:    Induction:   Airway Management Planned:   Additional Equipment:   Intra-op Plan:   Post-operative Plan:   Informed Consent: I have reviewed the patients History and Physical, chart, labs and discussed the procedure including the risks, benefits and alternatives for the proposed anesthesia with the patient or authorized representative who has indicated his/her understanding and acceptance.     Plan Discussed with: CRNA  Anesthesia Plan Comments:         Anesthesia Quick Evaluation

## 2016-09-06 NOTE — Op Note (Signed)
Preoperative diagnosis: Umbilical hernia with incarcerated omentum.  Postoperative diagnosis: Same.  Operative procedure: Repair of umbilical hernia with intraperitoneal 8 x 8 cm Ventralex ST mesh.  Operating surgeon: Ollen Bowl, M.D.  Anesthesia: Gen. by LMA, Marcaine 0.5% plain, 30 mL local infiltration.  Estimated blood loss: 10 mL.  Clinical note: This 60 year old male developed an enlarging umbilical hernia that is only partially reducible in the supine position. He was later for elective repair. He received Kefzol prior to the procedure. SCD stockings were used for DVT prevention. Hair was removed from the surgical site prior to presentation to the operating theater.  Operative note: With the patient under adequate general anesthesia the abdomen was prepped with ChloraPrep and draped. Marcaine was infiltrated for postoperative analgesia. An infraumbilical incision was made and carried of the skin followed by dissection with electrocautery and Metzenbaum scissors. The hernia sac was freed from the overlying skin. This measured about 7 cm maximal diameter. The fascial defect was just over 3 cm. The hernia sac was transected with cautery at the fascial level. The undersurface of the fascia was cleared circumferentially. Based on his size it was elected to make use of prosthetic mesh. An 8 x 8 cm piece of Ventralex ST mesh was placed and anchored into position with 4 corner sutures of 0 Maxon placed with trans-fascial sutures. The defect was then closed with interrupted 0 Surgilon sutures. Umbilical skin was tacked to the fascia with a 3-0 Vicryl figure-of-eight suture. The adipose layer was closed with 2 layers of 3-0 Vicryl running suture. The skin was closed with a running 4-0 Vicryl subcuticular suture. Benzoin, Steri-Strips, Telfa and Tegaderm dressing was applied.  The patient tolerated the procedure well and was taken recovery in stable condition.

## 2016-09-06 NOTE — Transfer of Care (Signed)
Immediate Anesthesia Transfer of Care Note  Patient: Andrew [name]  Procedure(s) Performed: Procedure(s): HERNIA REPAIR UMBILICAL ADULT (N/A)  Patient Location: PACU  Anesthesia Type:General  Level of Consciousness: sedated  Airway & Oxygen Therapy: Patient Spontanous Breathing and Patient connected to face mask oxygen  Post-op Assessment: Report given to RN and Post -op Vital signs reviewed and stable  Post vital signs: Reviewed and stable  Last Vitals:  Vitals:   09/06/16 1015 09/06/16 1158  BP: 128/68 131/79  Pulse: 65 68  Resp: 20 13  Temp: 37 C [id number] C    Complications: No apparent anesthesia complications

## 2016-09-06 NOTE — Anesthesia Procedure Notes (Signed)
Procedure Name: LMA Insertion Date/Time: 09/06/2016 10:44 AM Performed by: Doreen Salvage Pre-anesthesia Checklist: Patient identified, Patient being monitored, Timeout performed, Emergency Drugs available and Suction available Patient Re-evaluated:Patient Re-evaluated prior to inductionOxygen Delivery Method: Circle system utilized Preoxygenation: Pre-oxygenation with 100% oxygen Intubation Type: IV induction Ventilation: Mask ventilation without difficulty LMA: LMA inserted LMA Size: 5.0 Tube type: Oral Number of attempts: 1 Placement Confirmation: positive ETCO2 and breath sounds checked- equal and bilateral Tube secured with: Tape Dental Injury: Teeth and Oropharynx as per pre-operative assessment

## 2016-09-08 NOTE — Anesthesia Postprocedure Evaluation (Signed)
Anesthesia Post Note  Patient: Andrew [name]  Procedure(s) Performed: Procedure(s) (LRB): HERNIA REPAIR UMBILICAL ADULT (N/A)  Patient location during evaluation: PACU Anesthesia Type: General Level of consciousness: awake and alert Pain management: pain level controlled Vital Signs Assessment: post-procedure vital signs reviewed and stable Respiratory status: spontaneous breathing, nonlabored ventilation, respiratory function stable and patient connected to nasal cannula oxygen Cardiovascular status: blood pressure returned to baseline and stable Postop Assessment: no signs of nausea or vomiting Anesthetic complications: no    Last Vitals:  Vitals:   09/06/16 1250 09/06/16 1300  BP: (!) 110/49 (!) 130/59  Pulse: 69 65  Resp: 16   Temp: (!) 35.8 C     Last Pain:  Vitals:   09/07/16 0913  TempSrc:   PainSc: 0-No pain                 Molli Barrows

## 2016-09-10 ENCOUNTER — Ambulatory Visit (INDEPENDENT_AMBULATORY_CARE_PROVIDER_SITE_OTHER): Payer: BLUE CROSS/BLUE SHIELD | Admitting: *Deleted

## 2016-09-10 ENCOUNTER — Encounter: Payer: Self-pay | Admitting: General Surgery

## 2016-09-10 DIAGNOSIS — K429 Umbilical hernia without obstruction or gangrene: Secondary | ICD-10-CM

## 2016-09-10 NOTE — Patient Instructions (Signed)
Follow up as scheduled.  Call office with any questions.

## 2016-09-10 NOTE — Progress Notes (Signed)
Patient came in today for a wound check umbilical area.  The wound is clean, removed steri strips from umbilical area. The strips caused some blisters at incision site.  Follow up as scheduled.  Call office with any questions.

## 2016-09-13 ENCOUNTER — Ambulatory Visit (INDEPENDENT_AMBULATORY_CARE_PROVIDER_SITE_OTHER): Payer: BLUE CROSS/BLUE SHIELD | Admitting: General Surgery

## 2016-09-13 ENCOUNTER — Encounter: Payer: Self-pay | Admitting: General Surgery

## 2016-09-13 VITALS — BP 142/92 | HR 66 | Resp 16 | Ht 68.0 in | Wt 274.0 lb

## 2016-09-13 DIAGNOSIS — K429 Umbilical hernia without obstruction or gangrene: Secondary | ICD-10-CM

## 2016-09-13 NOTE — Progress Notes (Signed)
Patient ID: Andrew [name], male   DOB: [birth year], 60 y.o.   MRN: [id number]  Chief Complaint  Patient presents with  . Routine Post Op    HPI Andrew [name] is a 60 y.o. male.  Here today for postoperative visit, he states he is doing well. Denies any gastrointestinal issues, bowels are moving regular.  HPI  Past Medical History:  Diagnosis Date  . Chronic pain syndrome   . Depression   . Diverticulosis   . DVT (deep venous thrombosis) (HCC)    bilateral, was on anticoags for 37months  . Ear infection 08/2016   CLEARING UP-FINISHED ANTIBIOTIC AND PREDNISONE TAPER   . Family history of adverse reaction to anesthesia    BROTHER-N/V  . Hernia, umbilical   . History of kidney stones   . Hyperlipidemia   . Hypertension   . IFG (impaired fasting glucose)   . Knee pain, chronic   . Pes anserine bursitis     Past Surgical History:  Procedure Laterality Date  . COLONOSCOPY  2015  . JOINT REPLACEMENT Bilateral 2009   replacement and revision  . KIDNEY STONE SURGERY    . UMBILICAL HERNIA REPAIR N/A 09/06/2016   HERNIA REPAIR UMBILICAL ADULT; 8 cm Ventralex ST mesh. Surgeon: Robert Bellow, MD;  Location: [facility] ORS;  Service: General;  Laterality: N/A;    Family History  Problem Relation Age of Onset  . Alcohol abuse Father   . Hypertension Father   . Cirrhosis Father   . Hypertension Brother   . Stroke Paternal Grandmother   . Stroke Paternal Grandfather   . Pneumonia Mother     Social History Social History  Substance Use Topics  . Smoking status: Former Smoker    Packs/day: 1.50    Years: 25.00    Types: Cigarettes    Quit date: 06/28/2011  . Smokeless tobacco: Never Used  . Alcohol use Yes     Comment: on occasion    No Known Allergies  Current Outpatient Prescriptions  Medication Sig Dispense Refill  . benazepril (LOTENSIN) 40 MG tablet Take 1 tablet (40 mg total) by mouth daily. (Patient taking differently: Take 40 mg by mouth every morning. ) 30 tablet  0  . diclofenac (VOLTAREN) 75 MG EC tablet TAKE 1 TABLET BY MOUTH TWO  TIMES DAILY 180 tablet 1  . fluticasone (FLONASE) 50 MCG/ACT nasal spray Place 2 sprays into both nostrils daily. 16 g 6  . hydrochlorothiazide (MICROZIDE) 12.5 MG capsule Take 1 capsule (12.5 mg total) by mouth daily. 90 capsule 1  . metoprolol succinate (TOPROL-XL) 100 MG 24 hr tablet TAKE 1 TABLET BY MOUTH  DAILY WITH OR IMMEDIATELY  FOLLOWING A MEAL (Patient taking differently: Take 100 mg by mouth every morning. TAKE 1 TABLET BY MOUTH  DAILY WITH OR IMMEDIATELY  FOLLOWING A MEAL) 30 tablet 0  . testosterone cypionate (DEPOTESTOSTERONE CYPIONATE) 200 MG/ML injection Inject 1 mL (200 mg total) into the muscle every 14 (fourteen) days. 10 mL 2  . traMADol (ULTRAM) 50 MG tablet Take 50-100 mg by mouth 2 (two) times daily. Takes 100 mg in the morning and 50 mg in the evening  4   No current facility-administered medications for this visit.     Review of Systems Review of Systems  Constitutional: Negative.   Respiratory: Negative.   Cardiovascular: Negative.     Blood pressure (!) 142/92, pulse 66, resp. rate 16, height 5\' 8"  (1.727 m), weight 274 lb (124.3 kg).  Physical  Exam Physical Exam  Constitutional: He is oriented to person, place, and time. He appears well-developed and well-nourished.  Abdominal:    Minimal irritation and bruising  Neurological: He is alert and oriented to person, place, and time.  Skin: Skin is warm and dry.  Psychiatric: His behavior is normal.       Assessment    Doing well status post ventral hernia repair.    Plan         Recommend antibiotic ointment to the area. Proper lifting techniques reviewed. Resume activities as tolerated. The patient is aware to call back for any questions or concerns.   This information has been scribed by Karie Fetch RN, BSN,BC.   Robert Bellow 09/14/2016, 7:02 PM

## 2016-09-13 NOTE — Patient Instructions (Addendum)
The patient is aware to call back for any questions or concerns. Recommend antibiotic ointment to the area. Proper lifting techniques reviewed. Resume activities as tolerated

## 2016-09-14 ENCOUNTER — Encounter: Payer: Self-pay | Admitting: General Surgery

## 2016-10-02 ENCOUNTER — Encounter: Payer: Self-pay | Admitting: General Surgery

## 2016-12-10 ENCOUNTER — Other Ambulatory Visit: Payer: Self-pay | Admitting: Family Medicine

## 2016-12-10 NOTE — Telephone Encounter (Signed)
Over due for physical. Needs appointment

## 2017-01-19 ENCOUNTER — Other Ambulatory Visit: Payer: Self-pay | Admitting: Family Medicine

## 2017-01-21 ENCOUNTER — Other Ambulatory Visit: Payer: Self-pay | Admitting: Family Medicine

## 2017-01-21 NOTE — Telephone Encounter (Signed)
OK to call in

## 2017-01-21 NOTE — Telephone Encounter (Signed)
Needs an appointment.

## 2017-01-25 ENCOUNTER — Telehealth: Payer: Self-pay | Admitting: Family Medicine

## 2017-01-25 NOTE — Telephone Encounter (Signed)
Verbal order given for syringes and needles, and verified Testosterone prescription.

## 2017-01-25 NOTE — Telephone Encounter (Signed)
Brittney from [facility] called needing to speak with someone regarding this patients testosterone 200mg .  They need to know if this patient will be coming to the office for the injections or if he will need the supplies sent to his home.  They need an answer if at all possible today.  Thanks  Brittney [phone] order # [id number]

## 2017-02-01 ENCOUNTER — Other Ambulatory Visit: Payer: Self-pay | Admitting: Family Medicine

## 2017-02-01 NOTE — Telephone Encounter (Signed)
Called and spoke with Zoey at [facility], prescription given to her.

## 2017-02-01 NOTE — Telephone Encounter (Signed)
OK to call in

## 2017-02-22 ENCOUNTER — Ambulatory Visit (INDEPENDENT_AMBULATORY_CARE_PROVIDER_SITE_OTHER): Payer: BLUE CROSS/BLUE SHIELD | Admitting: Family Medicine

## 2017-02-22 ENCOUNTER — Encounter: Payer: Self-pay | Admitting: Family Medicine

## 2017-02-22 VITALS — BP 112/77 | HR 66 | Temp 97.9°F | Ht 67.75 in | Wt 281.7 lb

## 2017-02-22 DIAGNOSIS — E291 Testicular hypofunction: Secondary | ICD-10-CM | POA: Diagnosis not present

## 2017-02-22 DIAGNOSIS — Z114 Encounter for screening for human immunodeficiency virus [HIV]: Secondary | ICD-10-CM | POA: Diagnosis not present

## 2017-02-22 DIAGNOSIS — Z6841 Body Mass Index (BMI) 40.0 and over, adult: Secondary | ICD-10-CM

## 2017-02-22 DIAGNOSIS — Z13220 Encounter for screening for lipoid disorders: Secondary | ICD-10-CM | POA: Diagnosis not present

## 2017-02-22 DIAGNOSIS — I129 Hypertensive chronic kidney disease with stage 1 through stage 4 chronic kidney disease, or unspecified chronic kidney disease: Secondary | ICD-10-CM | POA: Diagnosis not present

## 2017-02-22 DIAGNOSIS — Z125 Encounter for screening for malignant neoplasm of prostate: Secondary | ICD-10-CM

## 2017-02-22 DIAGNOSIS — Z0000 Encounter for general adult medical examination without abnormal findings: Secondary | ICD-10-CM

## 2017-02-22 MED ORDER — HYDROCHLOROTHIAZIDE 12.5 MG PO CAPS: 12.5000 mg | ORAL_CAPSULE | Freq: Every day | ORAL | 1 refills | Status: DC

## 2017-02-22 MED ORDER — DICLOFENAC SODIUM 75 MG PO TBEC: 75.0000 mg | DELAYED_RELEASE_TABLET | Freq: Two times a day (BID) | ORAL | 1 refills | Status: DC

## 2017-02-22 MED ORDER — TESTOSTERONE CYPIONATE 200 MG/ML IM SOLN: INTRAMUSCULAR | 1 refills | Status: DC

## 2017-02-22 MED ORDER — BUPROPION HCL ER (SR) 150 MG PO TB12: 150.0000 mg | ORAL_TABLET | Freq: Two times a day (BID) | ORAL | 6 refills | Status: DC

## 2017-02-22 MED ORDER — BENAZEPRIL HCL 40 MG PO TABS: 40.0000 mg | ORAL_TABLET | Freq: Every day | ORAL | 1 refills | Status: DC

## 2017-02-22 MED ORDER — FLUTICASONE PROPIONATE 50 MCG/ACT NA SUSP: 2.0000 | Freq: Every day | NASAL | 6 refills | Status: DC

## 2017-02-22 MED ORDER — METOPROLOL SUCCINATE ER 100 MG PO TB24: ORAL_TABLET | ORAL | 1 refills | Status: DC

## 2017-02-22 NOTE — Progress Notes (Signed)
BP 112/77 (BP Location: Left Arm, Patient Position: Sitting, Cuff Size: Large)   Pulse 66   Temp 97.9 F (36.6 C)   Ht 5' 7.75" (1.721 m)   Wt 281 lb 11.2 oz (127.8 kg)   SpO2 98%   BMI 43.15 kg/m    Subjective:    Patient ID: Andrew [name], male    DOB: September 07, 1956, 61 y.o.   MRN: [id number]  HPI: Andrew [name] is a 62 y.o. male presenting on 02/22/2017 for comprehensive medical examination. Current medical complaints include:  HYPERTENSION Hypertension status: controlled  Satisfied with current treatment? yes Duration of hypertension: chronic BP monitoring frequency:  not checking BP medication side effects:  no Medication compliance: excellent compliance Previous BP meds: metoprolol, hctz, benazepril Aspirin: no Recurrent headaches: no Visual changes: no Palpitations: no Dyspnea: no Chest pain: no Lower extremity edema: no Dizzy/lightheaded: no  LOW TESTOSTERONE Duration: chronic Status: controlled  Satisfied with current treatment:  yes Medication side effects:  no Medication compliance: excellent compliance Decreased libido: no Fatigue: no Depressed mood: no Muscle weakness: no Erectile dysfunction: no  He currently lives with: wife Interim Problems from his last visit: no  Depression Screen done today and results listed below:  Depression screen [facility] 2/9 02/22/2017 10/05/2015  Decreased Interest 0 0  Down, Depressed, Hopeless 0 0  PHQ - 2 Score 0 0  Altered sleeping 0 -  Tired, decreased energy 0 -  Change in appetite 0 -  Feeling bad or failure about yourself  0 -  Trouble concentrating 0 -  Moving slowly or fidgety/restless 0 -  Suicidal thoughts 0 -  PHQ-9 Score 0 -    Past Medical History:  Past Medical History:  Diagnosis Date  . Chronic pain syndrome   . Depression   . Diverticulosis   . DVT (deep venous thrombosis) (HCC)    bilateral, was on anticoags for 59months  . Ear infection 08/2016   CLEARING UP-FINISHED ANTIBIOTIC AND  PREDNISONE TAPER   . Family history of adverse reaction to anesthesia    BROTHER-N/V  . Hernia, umbilical   . History of kidney stones   . Hyperlipidemia   . Hypertension   . IFG (impaired fasting glucose)   . Knee pain, chronic   . Pes anserine bursitis     Surgical History:  Past Surgical History:  Procedure Laterality Date  . COLONOSCOPY  2015  . JOINT REPLACEMENT Bilateral 2009   replacement and revision  . KIDNEY STONE SURGERY    . UMBILICAL HERNIA REPAIR N/A 09/06/2016   Procedure: HERNIA REPAIR UMBILICAL ADULT;  Surgeon: Robert Bellow, MD;  Location: [facility] ORS;  Service: General;  Laterality: N/A;    Medications:  Current Outpatient Prescriptions on File Prior to Visit  Medication Sig  . traMADol (ULTRAM) 50 MG tablet Take 50-100 mg by mouth 2 (two) times daily. Takes 100 mg in the morning and 50 mg in the evening   No current facility-administered medications on file prior to visit.     Allergies:  No Known Allergies  Social History:  Social History   Social History  . Marital status: Married    Spouse name: N/A  . Number of children: N/A  . Years of education: N/A   Occupational History  . Not on file.   Social History Main Topics  . Smoking status: Former Smoker    Packs/day: 1.50    Years: 25.00    Types: Cigarettes    Quit date: 06/28/2011  .  Smokeless tobacco: Never Used  . Alcohol use Yes     Comment: on occasion  . Drug use: No  . Sexual activity: Yes    Birth control/ protection: None   Other Topics Concern  . Not on file   Social History Narrative  . No narrative on file   History  Smoking Status  . Former Smoker  . Packs/day: 1.50  . Years: 25.00  . Types: Cigarettes  . Quit date: 06/28/2011  Smokeless Tobacco  . Never Used   History  Alcohol Use  . Yes    Comment: on occasion    Family History:  Family History  Problem Relation Age of Onset  . Alcohol abuse Father   . Hypertension Father   . Cirrhosis Father   .  Hypertension Brother   . Stroke Paternal Grandmother   . Stroke Paternal Grandfather   . Pneumonia Mother     Past medical history, surgical history, medications, allergies, family history and social history reviewed with patient today and changes made to appropriate areas of the chart.   Review of Systems  Constitutional: Negative.   HENT: Positive for congestion. Negative for ear discharge, ear pain, hearing loss, nosebleeds, sinus pain, sore throat and tinnitus.   Eyes: Negative.   Respiratory: Positive for shortness of breath. Negative for cough, hemoptysis, sputum production, wheezing and stridor.   Cardiovascular: Negative.   Gastrointestinal: Negative.   Genitourinary: Negative.   Musculoskeletal: Positive for joint pain. Negative for back pain, falls, myalgias and neck pain.  Skin: Negative.   Neurological: Negative.   Endo/Heme/Allergies: Positive for environmental allergies. Negative for polydipsia. Does not bruise/bleed easily.  Psychiatric/Behavioral: Negative.    All other ROS negative except what is listed above and in the HPI.      Objective:    BP 112/77 (BP Location: Left Arm, Patient Position: Sitting, Cuff Size: Large)   Pulse 66   Temp 97.9 F (36.6 C)   Ht 5' 7.75" (1.721 m)   Wt 281 lb 11.2 oz (127.8 kg)   SpO2 98%   BMI 43.15 kg/m   Wt Readings from Last 3 Encounters:  02/22/17 281 lb 11.2 oz (127.8 kg)  09/13/16 274 lb (124.3 kg)  09/06/16 266 lb (120.7 kg)    Physical Exam  Constitutional: He is oriented to person, place, and time. He appears well-developed and well-nourished. No distress.  HENT:  Head: Normocephalic and atraumatic.  Right Ear: Hearing, tympanic membrane, external ear and ear canal normal.  Left Ear: Hearing, tympanic membrane, external ear and ear canal normal.  Nose: Nose normal.  Mouth/Throat: Uvula is midline, oropharynx is clear and moist and mucous membranes are normal. No oropharyngeal exudate.  Eyes: Conjunctivae, EOM  and lids are normal. Pupils are equal, round, and reactive to light. Right eye exhibits no discharge. Left eye exhibits no discharge. No scleral icterus.  Neck: Normal range of motion. Neck supple. No JVD present. No tracheal deviation present. No thyromegaly present.  Cardiovascular: Normal rate, regular rhythm, normal heart sounds and intact distal pulses.  Exam reveals no gallop and no friction rub.   No murmur heard. Pulmonary/Chest: Effort normal and breath sounds normal. No stridor. No respiratory distress. He has no wheezes. He has no rales. He exhibits no tenderness.  Abdominal: Soft. Bowel sounds are normal. He exhibits no distension and no mass. There is no tenderness. There is no rebound and no guarding. Hernia confirmed negative in the right inguinal area and confirmed negative in the left  inguinal area.  Genitourinary: Rectum normal, prostate normal, testes normal and penis normal. Prostate is not enlarged and not tender. Cremasteric reflex is present. Right testis shows no mass, no swelling and no tenderness. Right testis is descended. Cremasteric reflex is not absent on the right side. Left testis shows no mass, no swelling and no tenderness. Left testis is descended. Cremasteric reflex is not absent on the left side. Circumcised. No phimosis, paraphimosis, hypospadias, penile erythema or penile tenderness. No discharge found.  Musculoskeletal: Normal range of motion. He exhibits no edema, tenderness or deformity.  Lymphadenopathy:    He has no cervical adenopathy.       Right: No inguinal adenopathy present.       Left: No inguinal adenopathy present.  Neurological: He is alert and oriented to person, place, and time. He has normal reflexes. He displays normal reflexes. No cranial nerve deficit. He exhibits normal muscle tone. Coordination normal.  Skin: Skin is warm, dry and intact. No rash noted. He is not diaphoretic. No erythema. No pallor.  Psychiatric: He has a normal mood and  affect. His speech is normal and behavior is normal. Judgment and thought content normal. Cognition and memory are normal.  Nursing note and vitals reviewed.   Results for orders placed or performed during the hospital encounter of 08/30/16  Potassium  Result Value Ref Range   Potassium 4.7 3.5 - 5.1 mmol/L  CBC WITH DIFFERENTIAL  Result Value Ref Range   WBC 8.2 3.8 - 10.6 K/uL   RBC 4.52 4.40 - 5.90 MIL/uL   Hemoglobin 14.4 13.0 - 18.0 g/dL   HCT 41.4 40.0 - 52.0 %   MCV 91.7 80.0 - 100.0 fL   MCH 31.9 26.0 - 34.0 pg   MCHC 34.8 32.0 - 36.0 g/dL   RDW 14.1 11.5 - 14.5 %   Platelets 145 (L) 150 - 440 K/uL   Neutrophils Relative % 70 %   Neutro Abs 5.7 1.4 - 6.5 K/uL   Lymphocytes Relative 17 %   Lymphs Abs 1.4 1.0 - 3.6 K/uL   Monocytes Relative 8 %   Monocytes Absolute 0.7 0.2 - 1.0 K/uL   Eosinophils Relative 4 %   Eosinophils Absolute 0.4 0 - 0.7 K/uL   Basophils Relative 1 %   Basophils Absolute 0.1 0 - 0.1 K/uL      Assessment & Plan:   Problem List Items Addressed This Visit      Endocrine   Hypogonadism in male    Stable on current regimen. Refill given today.        Genitourinary   Benign hypertensive renal disease    Under good control. Continue current regimen. Continue to monitor. Call with any concerns.         Other   Obesity    Will start wellbutrin. Call with any concerns. Recheck 2-3 months.        Other Visit Diagnoses    Routine general medical examination at a health care facility    -  Primary   Will give Tdap next visit. Labs checked today. Colonoscopy up to date. Continue diet and exercise. Call with any concerns.    Screening for HIV without presence of risk factors       Labs drawn today, await results.    Relevant Orders   HIV antibody   Screening for cholesterol level       Labs drawn today, await results.    Screening for prostate cancer  Labs drawn today, await results.    Relevant Orders   PSA       Discussed  aspirin prophylaxis for myocardial infarction prevention and decision was made to start ASA  LABORATORY TESTING:  Health maintenance labs ordered today as discussed above.   The natural history of prostate cancer and ongoing controversy regarding screening and potential treatment outcomes of prostate cancer has been discussed with the patient. The meaning of a false positive PSA and a false negative PSA has been discussed. He indicates understanding of the limitations of this screening test and wishes to proceed with screening PSA testing.   IMMUNIZATIONS:   - Tdap: Tetanus vaccination status reviewed: Out of Tdap today- will get next visit. - Influenza: Postponed to flu season - Pneumovax: Not applicable - Prevnar: Not applicable - Zostavax vaccine: Will check with his insurance  SCREENING: - Colonoscopy: Up to date  Discussed with patient purpose of the colonoscopy is to detect colon cancer at curable precancerous or early stages   PATIENT COUNSELING:    Sexuality: Discussed sexually transmitted diseases, partner selection, use of condoms, avoidance of unintended pregnancy  and contraceptive alternatives.   Advised to avoid cigarette smoking.  I discussed with the patient that most people either abstain from alcohol or drink within safe limits (<=14/week and <=4 drinks/occasion for males, <=7/weeks and <= 3 drinks/occasion for females) and that the risk for alcohol disorders and other health effects rises proportionally with the number of drinks per week and how often a drinker exceeds daily limits.  Discussed cessation/primary prevention of drug use and availability of treatment for abuse.   Diet: Encouraged to adjust caloric intake to maintain  or achieve ideal body weight, to reduce intake of dietary saturated fat and total fat, to limit sodium intake by avoiding high sodium foods and not adding table salt, and to maintain adequate dietary potassium and calcium preferably from fresh  fruits, vegetables, and low-fat dairy products.    stressed the importance of regular exercise  Injury prevention: Discussed safety belts, safety helmets, smoke detector, smoking near bedding or upholstery.   Dental health: Discussed importance of regular tooth brushing, flossing, and dental visits.   Follow up plan: NEXT PREVENTATIVE PHYSICAL DUE IN 1 YEAR. Return 2-3 months, for follow up weight.

## 2017-02-22 NOTE — Assessment & Plan Note (Signed)
Will start wellbutrin. Call with any concerns. Recheck 2-3 months.

## 2017-02-22 NOTE — Patient Instructions (Addendum)
 Health Maintenance, Male A healthy lifestyle and preventive care is important for your health and wellness. Ask your health care provider about what schedule of regular examinations is right for you. What should I know about weight and diet?  Eat a Healthy Diet  Eat plenty of vegetables, fruits, whole grains, low-fat dairy products, and lean protein.  Do not eat a lot of foods high in solid fats, added sugars, or salt. Maintain a Healthy Weight  Regular exercise can help you achieve or maintain a healthy weight. You should:  Do at least 150 minutes of exercise each week. The exercise should increase your heart rate and make you sweat (moderate-intensity exercise).  Do strength-training exercises at least twice a week. Watch Your Levels of Cholesterol and Blood Lipids  Have your blood tested for lipids and cholesterol every 5 years starting at 59 years of age. If you are at high risk for heart disease, you should start having your blood tested when you are 59 years old. You may need to have your cholesterol levels checked more often if:  Your lipid or cholesterol levels are high.  You are older than 59 years of age.  You are at high risk for heart disease. What should I know about cancer screening? Many types of cancers can be detected early and may often be prevented. Lung Cancer  You should be screened every year for lung cancer if:  You are a current smoker who has smoked for at least 30 years.  You are a former smoker who has quit within the past 15 years.  Talk to your health care provider about your screening options, when you should start screening, and how often you should be screened. Colorectal Cancer  Routine colorectal cancer screening usually begins at 59 years of age and should be repeated every 5-10 years until you are 59 years old. You may need to be screened more often if early forms of precancerous polyps or small growths are found. Your health care provider  may recommend screening at an earlier age if you have risk factors for colon cancer.  Your health care provider may recommend using home test kits to check for hidden blood in the stool.  A small camera at the end of a tube can be used to examine your colon (sigmoidoscopy or colonoscopy). This checks for the earliest forms of colorectal cancer. Prostate and Testicular Cancer  Depending on your age and overall health, your health care provider may do certain tests to screen for prostate and testicular cancer.  Talk to your health care provider about any symptoms or concerns you have about testicular or prostate cancer. Skin Cancer  Check your skin from head to toe regularly.  Tell your health care provider about any new moles or changes in moles, especially if:  There is a change in a mole's size, shape, or color.  You have a mole that is larger than a pencil eraser.  Always use sunscreen. Apply sunscreen liberally and repeat throughout the day.  Protect yourself by wearing long sleeves, pants, a wide-brimmed hat, and sunglasses when outside. What should I know about heart disease, diabetes, and high blood pressure?  If you are 18-39 years of age, have your blood pressure checked every 3-5 years. If you are 40 years of age or older, have your blood pressure checked every year. You should have your blood pressure measured twice-once when you are at a hospital or clinic, and once when you are not at   a hospital or clinic. Record the average of the two measurements. To check your blood pressure when you are not at a hospital or clinic, you can use:  An automated blood pressure machine at a pharmacy.  A home blood pressure monitor.  Talk to your health care provider about your target blood pressure.  If you are between 45-79 years old, ask your health care provider if you should take aspirin to prevent heart disease.  Have regular diabetes screenings by checking your fasting blood sugar  level.  If you are at a normal weight and have a low risk for diabetes, have this test once every three years after the age of 45.  If you are overweight and have a high risk for diabetes, consider being tested at a younger age or more often.  A one-time screening for abdominal aortic aneurysm (AAA) by ultrasound is recommended for men aged 65-75 years who are current or former smokers. What should I know about preventing infection? Hepatitis B  If you have a higher risk for hepatitis B, you should be screened for this virus. Talk with your health care provider to find out if you are at risk for hepatitis B infection. Hepatitis C  Blood testing is recommended for:  Everyone born from 1945 through 1965.  Anyone with known risk factors for hepatitis C. Sexually Transmitted Diseases (STDs)  You should be screened each year for STDs including gonorrhea and chlamydia if:  You are sexually active and are younger than 59 years of age.  You are older than 59 years of age and your health care provider tells you that you are at risk for this type of infection.  Your sexual activity has changed since you were last screened and you are at an increased risk for chlamydia or gonorrhea. Ask your health care provider if you are at risk.  Talk with your health care provider about whether you are at high risk of being infected with HIV. Your health care provider may recommend a prescription medicine to help prevent HIV infection. What else can I do?  Schedule regular health, dental, and eye exams.  Stay current with your vaccines (immunizations).  Do not use any tobacco products, such as cigarettes, chewing tobacco, and e-cigarettes. If you need help quitting, ask your health care provider.  Limit alcohol intake to no more than 2 drinks per day. One drink equals 12 ounces of beer, 5 ounces of wine, or 1 ounces of hard liquor.  Do not use street drugs.  Do not share needles.  Ask your health  care provider for help if you need support or information about quitting drugs.  Tell your health care provider if you often feel depressed.  Tell your health care provider if you have ever been abused or do not feel safe at home. This information is not intended to replace advice given to you by your health care provider. Make sure you discuss any questions you have with your health care provider. Document Released: 03/15/2008 Document Revised: 05/16/2016 Document Reviewed: 06/21/2015 Elsevier Interactive Patient Education  2017 [employer]  

## 2017-02-22 NOTE — Assessment & Plan Note (Signed)
Under good control. Continue current regimen. Continue to monitor. Call with any concerns. 

## 2017-02-22 NOTE — Assessment & Plan Note (Signed)
Stable on current regimen. Refill given today.

## 2017-02-23 LAB — HIV ANTIBODY (ROUTINE TESTING W REFLEX): HIV SCREEN 4TH GENERATION: NONREACTIVE

## 2017-02-23 LAB — PSA: PROSTATE SPECIFIC AG, SERUM: 1 ng/mL (ref 0.0–4.0)

## 2017-02-28 ENCOUNTER — Telehealth: Payer: Self-pay

## 2017-02-28 NOTE — Telephone Encounter (Signed)
Patient's PA for his testosterone was denied yesterday for medical criteria not being met (even thought it was a continuation of therapy.  Patient notified and he stated that we had to do this last year.  Dr. Wynetta Emery can appeal write a letter about how it's medically necessary, with trial and fail of creams and that patient has been on this medication for over a year. The letter can be faxed to [phone]. Routing to provider.

## 2017-03-05 ENCOUNTER — Encounter: Payer: Self-pay | Admitting: Family Medicine

## 2017-03-05 NOTE — Telephone Encounter (Signed)
Letter written, OK to be faxed

## 2017-03-05 NOTE — Telephone Encounter (Signed)
Pt stated he has never used a cream only the injection.

## 2017-03-05 NOTE — Telephone Encounter (Signed)
Can we please find out which creams he has tried and failed- I don't have a letter from last year, but I'd be happy to write this for him.

## 2017-03-06 NOTE — Telephone Encounter (Signed)
Letter faxed.

## 2017-03-06 NOTE — Telephone Encounter (Signed)
Did you fax this, or give it to me to fax? I do not remember seeing it.

## 2017-03-21 ENCOUNTER — Other Ambulatory Visit: Payer: Self-pay | Admitting: Family Medicine

## 2017-03-21 NOTE — Telephone Encounter (Signed)
RX filled at [facility] w/ 6 refills . Last routine OV: 02/22/17 Next OV: 05/03/17

## 2017-03-25 ENCOUNTER — Other Ambulatory Visit: Payer: Self-pay | Admitting: Family Medicine

## 2017-03-29 ENCOUNTER — Encounter: Payer: Self-pay | Admitting: Family Medicine

## 2017-03-29 ENCOUNTER — Ambulatory Visit (INDEPENDENT_AMBULATORY_CARE_PROVIDER_SITE_OTHER): Payer: BLUE CROSS/BLUE SHIELD | Admitting: Family Medicine

## 2017-03-29 VITALS — BP 127/80 | HR 75 | Temp 98.9°F | Wt 277.0 lb

## 2017-03-29 DIAGNOSIS — Z87442 Personal history of urinary calculi: Secondary | ICD-10-CM

## 2017-03-29 DIAGNOSIS — R109 Unspecified abdominal pain: Secondary | ICD-10-CM | POA: Diagnosis not present

## 2017-03-29 LAB — MICROSCOPIC EXAMINATION

## 2017-03-29 LAB — UA/M W/RFLX CULTURE, ROUTINE
Bilirubin, UA: NEGATIVE
GLUCOSE, UA: NEGATIVE
Leukocytes, UA: NEGATIVE
NITRITE UA: NEGATIVE
RBC, UA: NEGATIVE
SPEC GRAV UA: 1.025 (ref 1.005–1.030)
Urobilinogen, Ur: 1 mg/dL (ref 0.2–1.0)
pH, UA: 5.5 (ref 5.0–7.5)

## 2017-03-29 MED ORDER — TAMSULOSIN HCL 0.4 MG PO CAPS: 0.4000 mg | ORAL_CAPSULE | Freq: Every day | ORAL | 3 refills | Status: DC

## 2017-03-29 NOTE — Progress Notes (Signed)
   BP 127/80   Pulse 75   Temp 98.9 F (37.2 C)   Wt 277 lb (125.6 kg)   SpO2 96%   BMI 42.43 kg/m    Subjective:    Patient ID: Andrew [name], male    DOB: 1956/03/18, 61 y.o.   MRN: [id number]  HPI: Andrew [name] is a 61 y.o. male  Chief Complaint  Patient presents with  . Flank Pain    left kidney x 1 year but had an episode on Tuesday. Felt like past kidney stone pain. Improves with drinking water.   Patient presents with 1 year of intermittent left flank pains that seem to come on first thing in the morning if he doesn't hydrate well. Eased by drinking lots of water and stretching or changing positions. Had severe episode Tuesday morning that felt similar to his previous kidney stone episodes. Slowly improving over the past few days. Denies hematuria, dysuria, nausea, vomiting, diarrhea, fevers. Has not been taking anything for sxs. Feeling almost symptom free now today.   Relevant past medical, surgical, family and social history reviewed and updated as indicated. Interim medical history since our last visit reviewed. Allergies and medications reviewed and updated.  Review of Systems  Constitutional: Negative.   HENT: Negative.   Respiratory: Negative.   Cardiovascular: Negative.   Gastrointestinal: Negative.   Genitourinary: Positive for flank pain.  Neurological: Negative.   Psychiatric/Behavioral: Negative.    Per HPI unless specifically indicated above     Objective:    BP 127/80   Pulse 75   Temp 98.9 F (37.2 C)   Wt 277 lb (125.6 kg)   SpO2 96%   BMI 42.43 kg/m   Wt Readings from Last 3 Encounters:  03/29/17 277 lb (125.6 kg)  02/22/17 281 lb 11.2 oz (127.8 kg)  09/13/16 274 lb (124.3 kg)    Physical Exam  Constitutional: He is oriented to person, place, and time. He appears well-developed and well-nourished. No distress.  HENT:  Head: Atraumatic.  Eyes: Conjunctivae are normal. Pupils are equal, round, and reactive to light.  Neck: Normal  range of motion. Neck supple.  Cardiovascular: Normal rate and normal heart sounds.   Pulmonary/Chest: Effort normal and breath sounds normal. No respiratory distress.  Abdominal: Soft. Bowel sounds are normal. He exhibits no distension and no mass. There is no tenderness. There is no rebound.  Musculoskeletal: Normal range of motion. He exhibits no edema, tenderness (No CVA tenderness b/l) or deformity.  Neurological: He is alert and oriented to person, place, and time.  Skin: Skin is warm and dry.  Psychiatric: He has a normal mood and affect. His behavior is normal.  Nursing note and vitals reviewed.     Assessment & Plan:   Problem List Items Addressed This Visit    None    Visit Diagnoses    History of nephrolithiasis    -  Primary   U/A negative for RBCs or infection. Push fluids, continue to monitor.    Relevant Orders   UA/M w/rflx Culture, Routine (STAT) (Completed)   Acute left flank pain       Suspect his pain is from some stones that may be residing long term in left kidney. Will start flomax, push fluids, f/u if no improvement   Relevant Orders   CBC with Differential/Platelet   Comprehensive metabolic panel       Follow up plan: Return if symptoms worsen or fail to improve.

## 2017-03-30 LAB — CBC WITH DIFFERENTIAL/PLATELET
Basophils Absolute: 0 10*3/uL (ref 0.0–0.2)
Basos: 1 %
EOS (ABSOLUTE): 0.3 10*3/uL (ref 0.0–0.4)
EOS: 3 %
HEMATOCRIT: 47.6 % (ref 37.5–51.0)
HEMOGLOBIN: 16.2 g/dL (ref 13.0–17.7)
Immature Grans (Abs): 0 10*3/uL (ref 0.0–0.1)
Immature Granulocytes: 0 %
LYMPHS ABS: 1.8 10*3/uL (ref 0.7–3.1)
Lymphs: 21 %
MCH: 31.8 pg (ref 26.6–33.0)
MCHC: 34 g/dL (ref 31.5–35.7)
MCV: 93 fL (ref 79–97)
MONOCYTES: 9 %
Monocytes Absolute: 0.8 10*3/uL (ref 0.1–0.9)
Neutrophils Absolute: 5.7 10*3/uL (ref 1.4–7.0)
Neutrophils: 66 %
Platelets: 165 10*3/uL (ref 150–379)
RBC: 5.1 x10E6/uL (ref 4.14–5.80)
RDW: 15 % (ref 12.3–15.4)
WBC: 8.6 10*3/uL (ref 3.4–10.8)

## 2017-03-30 LAB — COMPREHENSIVE METABOLIC PANEL
ALBUMIN: 4.5 g/dL (ref 3.5–5.5)
ALT: 41 IU/L (ref 0–44)
AST: 29 IU/L (ref 0–40)
Albumin/Globulin Ratio: 2.3 — ABNORMAL HIGH (ref 1.2–2.2)
Alkaline Phosphatase: 81 IU/L (ref 39–117)
BUN / CREAT RATIO: 19 (ref 9–20)
BUN: 23 mg/dL (ref 6–24)
Bilirubin Total: 1.4 mg/dL — ABNORMAL HIGH (ref 0.0–1.2)
CALCIUM: 9.1 mg/dL (ref 8.7–10.2)
CO2: 23 mmol/L (ref 20–29)
CREATININE: 1.22 mg/dL (ref 0.76–1.27)
Chloride: 102 mmol/L (ref 96–106)
GFR, EST AFRICAN AMERICAN: 75 mL/min/{1.73_m2} (ref >59)
GFR, EST NON AFRICAN AMERICAN: 64 mL/min/{1.73_m2} (ref >59)
GLOBULIN, TOTAL: 2 g/dL (ref 1.5–4.5)
Glucose: 97 mg/dL (ref 65–99)
Potassium: 5.3 mmol/L — ABNORMAL HIGH (ref 3.5–5.2)
SODIUM: 140 mmol/L (ref 134–144)
Total Protein: 6.5 g/dL (ref 6.0–8.5)

## 2017-04-01 ENCOUNTER — Encounter: Payer: Self-pay | Admitting: Family Medicine

## 2017-04-21 ENCOUNTER — Other Ambulatory Visit: Payer: Self-pay | Admitting: Family Medicine

## 2017-05-03 ENCOUNTER — Ambulatory Visit: Payer: BLUE CROSS/BLUE SHIELD | Admitting: Family Medicine

## 2017-08-19 ENCOUNTER — Encounter: Payer: Self-pay | Admitting: Family Medicine

## 2017-08-19 ENCOUNTER — Ambulatory Visit (INDEPENDENT_AMBULATORY_CARE_PROVIDER_SITE_OTHER): Payer: BLUE CROSS/BLUE SHIELD | Admitting: Family Medicine

## 2017-08-19 DIAGNOSIS — M170 Bilateral primary osteoarthritis of knee: Secondary | ICD-10-CM | POA: Diagnosis not present

## 2017-08-19 DIAGNOSIS — I129 Hypertensive chronic kidney disease with stage 1 through stage 4 chronic kidney disease, or unspecified chronic kidney disease: Secondary | ICD-10-CM | POA: Diagnosis not present

## 2017-08-19 DIAGNOSIS — Z6841 Body Mass Index (BMI) 40.0 and over, adult: Secondary | ICD-10-CM | POA: Diagnosis not present

## 2017-08-19 MED ORDER — METOPROLOL SUCCINATE ER 100 MG PO TB24: ORAL_TABLET | ORAL | 1 refills | Status: DC

## 2017-08-19 MED ORDER — HYDROCHLOROTHIAZIDE 12.5 MG PO CAPS
12.5000 mg | ORAL_CAPSULE | Freq: Every day | ORAL | 1 refills | Status: DC
Start: 2017-08-19 — End: 2018-02-21

## 2017-08-19 MED ORDER — BUPROPION HCL ER (SR) 150 MG PO TB12: 150.0000 mg | ORAL_TABLET | Freq: Two times a day (BID) | ORAL | 1 refills | Status: DC

## 2017-08-19 MED ORDER — GABAPENTIN 300 MG PO CAPS: 300.0000 mg | ORAL_CAPSULE | Freq: Two times a day (BID) | ORAL | 1 refills | Status: DC

## 2017-08-19 MED ORDER — TRAMADOL HCL 50 MG PO TABS: 50.0000 mg | ORAL_TABLET | Freq: Two times a day (BID) | ORAL | 2 refills | Status: DC

## 2017-08-19 MED ORDER — BENAZEPRIL HCL 40 MG PO TABS: 40.0000 mg | ORAL_TABLET | Freq: Every day | ORAL | 1 refills | Status: DC

## 2017-08-19 MED ORDER — DICLOFENAC SODIUM 75 MG PO TBEC: 75.0000 mg | DELAYED_RELEASE_TABLET | Freq: Two times a day (BID) | ORAL | 1 refills | Status: DC

## 2017-08-19 NOTE — Progress Notes (Signed)
BP (!) 156/89 (BP Location: Left Arm, Patient Position: Sitting, Cuff Size: Large)   Pulse 69   Wt 288 lb 7 oz (130.8 kg)   SpO2 96%   BMI 44.18 kg/m    Subjective:    Patient ID: Andrew [name], male    DOB: April 28, 1956, 61 y.o.   MRN: [id number]  HPI: Andrew [name] is a 61 y.o. male  Chief Complaint  Patient presents with  . Obesity  . Knee Pain    Refill on tramadol and gabapentin   . Hand Pain  . Hypertension    Patient has not been taking his BP meds   WEIGHT GAIN Duration:  Previous attempts at weight loss: yes Complications of obesity: HTN, osteoarthritis Peak weight: 288 Weight loss goal: 250  Weight loss to date: none Requesting obesity pharmacotherapy: yes Current weight loss supplements/medications: no Previous weight loss supplements/meds: yes  HYPERTENSION Hypertension status: uncontrolled  Satisfied with current treatment? no Duration of hypertension: chronic BP monitoring frequency:  not checking BP range:  BP medication side effects:  no Medication compliance: poor compliance Previous BP meds: benazepril, hctz, metoprolol Aspirin: yes Recurrent headaches: no Visual changes: no Palpitations: no Dyspnea: no Chest pain: no Lower extremity edema: no Dizzy/lightheaded: no  KNEE PAIN Duration: chronic Involved knee: bilateral Mechanism of injury: unknown Location:diffuse Onset: gradual Severity: severe  Quality:  sharp, aching and throbbing Frequency: constant Radiation: no Aggravating factors: walking, running and stairs  Alleviating factors: ice, APAP and NSAIDs  Status: worse Treatments attempted: gabapentin, tramadol, diclofenac, rest, ice and heat  Relief with NSAIDs?:  mild Weakness with weight bearing or walking: no Sensation of giving way: no Locking: no Popping: no Bruising: no Swelling: no Redness: no Paresthesias/decreased sensation: no Fevers: no  Relevant past medical, surgical, family and social history reviewed  and updated as indicated. Interim medical history since our last visit reviewed. Allergies and medications reviewed and updated.  Review of Systems  Constitutional: Negative.   Respiratory: Negative.   Cardiovascular: Negative.   Musculoskeletal: Positive for arthralgias and myalgias. Negative for back pain, gait problem, joint swelling, neck pain and neck stiffness.  Neurological: Negative.   Psychiatric/Behavioral: Negative.     Per HPI unless specifically indicated above     Objective:    BP (!) 156/89 (BP Location: Left Arm, Patient Position: Sitting, Cuff Size: Large)   Pulse 69   Wt 288 lb 7 oz (130.8 kg)   SpO2 96%   BMI 44.18 kg/m   Wt Readings from Last 3 Encounters:  08/19/17 288 lb 7 oz (130.8 kg)  03/29/17 277 lb (125.6 kg)  02/22/17 281 lb 11.2 oz (127.8 kg)    Physical Exam  Constitutional: He is oriented to person, place, and time. He appears well-developed and well-nourished. No distress.  HENT:  Head: Normocephalic and atraumatic.  Right Ear: Hearing normal.  Left Ear: Hearing normal.  Nose: Nose normal.  Eyes: Conjunctivae and lids are normal. Right eye exhibits no discharge. Left eye exhibits no discharge. No scleral icterus.  Cardiovascular: Normal rate, regular rhythm, normal heart sounds and intact distal pulses. Exam reveals no gallop and no friction rub.  No murmur heard. Pulmonary/Chest: Effort normal and breath sounds normal. No respiratory distress. He has no wheezes. He has no rales. He exhibits no tenderness.  Musculoskeletal: Normal range of motion.  Neurological: He is alert and oriented to person, place, and time.  Skin: Skin is warm, dry and intact. No rash noted. He is not diaphoretic.  No erythema. No pallor.  Psychiatric: He has a normal mood and affect. His speech is normal and behavior is normal. Judgment and thought content normal. Cognition and memory are normal.  Vitals reviewed.   Results for orders placed or performed in visit on  03/29/17  Microscopic Examination  Result Value Ref Range   Epithelial Cells (non renal) 0-10 0 - 10 /hpf   Casts Present None seen /lpf   Cast Type Hyaline casts N/A   Bacteria, UA Few (A) None seen/Few  UA/M w/rflx Culture, Routine (STAT)  Result Value Ref Range   Specific Gravity, UA 1.025 1.005 - 1.030   pH, UA 5.5 5.0 - 7.5   Color, UA Yellow Yellow   Appearance Ur Clear Clear   Leukocytes, UA Negative Negative   Protein, UA 1+ (A) Negative/Trace   Glucose, UA Negative Negative   Ketones, UA Trace (A) Negative   RBC, UA Negative Negative   Bilirubin, UA Negative Negative   Urobilinogen, Ur 1.0 0.2 - 1.0 mg/dL   Nitrite, UA Negative Negative   Microscopic Examination See below:   CBC with Differential/Platelet  Result Value Ref Range   WBC 8.6 3.4 - 10.8 x10E3/uL   RBC 5.10 4.14 - 5.80 x10E6/uL   Hemoglobin 16.2 13.0 - 17.7 g/dL   Hematocrit 47.6 37.5 - 51.0 %   MCV 93 79 - 97 fL   MCH 31.8 26.6 - 33.0 pg   MCHC 34.0 31.5 - 35.7 g/dL   RDW 15.0 12.3 - 15.4 %   Platelets 165 150 - 379 x10E3/uL   Neutrophils 66 Not Estab. %   Lymphs 21 Not Estab. %   Monocytes 9 Not Estab. %   Eos 3 Not Estab. %   Basos 1 Not Estab. %   Neutrophils Absolute 5.7 1.4 - 7.0 x10E3/uL   Lymphocytes Absolute 1.8 0.7 - 3.1 x10E3/uL   Monocytes Absolute 0.8 0.1 - 0.9 x10E3/uL   EOS (ABSOLUTE) 0.3 0.0 - 0.4 x10E3/uL   Basophils Absolute 0.0 0.0 - 0.2 x10E3/uL   Immature Granulocytes 0 Not Estab. %   Immature Grans (Abs) 0.0 0.0 - 0.1 x10E3/uL  Comprehensive metabolic panel  Result Value Ref Range   Glucose 97 65 - 99 mg/dL   BUN 23 6 - 24 mg/dL   Creatinine, Ser 1.22 0.76 - 1.27 mg/dL   GFR calc non Af Amer 64 >59 mL/min/1.73   GFR calc Af Amer 75 >59 mL/min/1.73   BUN/Creatinine Ratio 19 9 - 20   Sodium 140 134 - 144 mmol/L   Potassium 5.3 (H) 3.5 - 5.2 mmol/L   Chloride 102 96 - 106 mmol/L   CO2 23 20 - 29 mmol/L   Calcium 9.1 8.7 - 10.2 mg/dL   Total Protein 6.5 6.0 - 8.5 g/dL    Albumin 4.5 3.5 - 5.5 g/dL   Globulin, Total 2.0 1.5 - 4.5 g/dL   Albumin/Globulin Ratio 2.3 (H) 1.2 - 2.2   Bilirubin Total 1.4 (H) 0.0 - 1.2 mg/dL   Alkaline Phosphatase 81 39 - 117 IU/L   AST 29 0 - 40 IU/L   ALT 41 0 - 44 IU/L      Assessment & Plan:   Problem List Items Addressed This Visit      Musculoskeletal and Integument   Primary osteoarthritis of both knees    Will get restarted on his medicine. Rx for tramadol given today- should last 3 months.       Relevant Medications   diclofenac (  VOLTAREN) 75 MG EC tablet   traMADol (ULTRAM) 50 MG tablet     Genitourinary   Benign hypertensive renal disease    Not under good control. Off his medication. Will restart medicine. Call with any concerns. Recheck 1 month.         Other   Obesity    Will start on wellbutrin. Will look into saxenda. Call with any concerns. Continue to monitor.           Follow up plan: Return in about 4 weeks (around 09/16/2017).

## 2017-08-19 NOTE — Patient Instructions (Addendum)
Liraglutide injection (Weight Management) What is this medicine? LIRAGLUTIDE (LIR a GLOO tide) is used with a reduced calorie diet and exercise to help you lose weight. This medicine may be used for other purposes; ask your health care provider or pharmacist if you have questions. COMMON BRAND NAME(S): Saxenda What should I tell my health care provider before I take this medicine? They need to know if you have any of these conditions: -endocrine tumors (MEN 2) or if someone in your family had these tumors -gallbladder disease -high cholesterol -history of alcohol abuse problem -history of pancreatitis -kidney disease or if you are on dialysis -liver disease -previous swelling of the tongue, face, or lips with difficulty breathing, difficulty swallowing, hoarseness, or tightening of the throat -stomach problems -suicidal thoughts, plans, or attempt; a previous suicide attempt by you or a family member -thyroid cancer or if someone in your family had thyroid cancer -an unusual or allergic reaction to liraglutide, other medicines, foods, dyes, or preservatives -pregnant or trying to get pregnant -breast-feeding How should I use this medicine? This medicine is for injection under the skin of your upper leg, stomach area, or upper arm. You will be taught how to prepare and give this medicine. Use exactly as directed. Take your medicine at regular intervals. Do not take it more often than directed. It is important that you put your used needles and syringes in a special sharps container. Do not put them in a trash can. If you do not have a sharps container, call your pharmacist or healthcare provider to get one. A special MedGuide will be given to you by the pharmacist with each prescription and refill. Be sure to read this information carefully each time. Talk to your pediatrician regarding the use of this medicine in children. Special care may be needed. Overdosage: If you think you have taken  too much of this medicine contact a poison control center or emergency room at once. NOTE: This medicine is only for you. Do not share this medicine with others. What if I miss a dose? If you miss a dose, take it as soon as you can. If it is almost time for your next dose, take only that dose. Do not take double or extra doses. If you miss your dose for 3 days or more, call your doctor or health care professional to talk about how to restart this medicine. What may interact with this medicine? -insulin and other medicines for diabetes This list may not describe all possible interactions. Give your health care provider a list of all the medicines, herbs, non-prescription drugs, or dietary supplements you use. Also tell them if you smoke, drink alcohol, or use illegal drugs. Some items may interact with your medicine. What should I watch for while using this medicine? Visit your doctor or health care professional for regular checks on your progress. This medicine is intended to be used in addition to a healthy diet and appropriate exercise. The best results are achieved this way. Do not increase or in any way change your dose without consulting your doctor or health care professional. Drink plenty of fluids while taking this medicine. Check with your doctor or health care professional if you get an attack of severe diarrhea, nausea, and vomiting. The loss of too much body fluid can make it dangerous for you to take this medicine. This medicine may affect blood sugar levels. If you have diabetes, check with your doctor or health care professional before you change your diet  or the dose of your diabetic medicine. Patients and their families should watch out for worsening depression or thoughts of suicide. Also watch out for sudden changes in feelings such as feeling anxious, agitated, panicky, irritable, hostile, aggressive, impulsive, severely restless, overly excited and hyperactive, or not being able to  sleep. If this happens, especially at the beginning of treatment or after a change in dose, call your health care professional. What side effects may I notice from receiving this medicine? Side effects that you should report to your doctor or health care professional as soon as possible: -allergic reactions like skin rash, itching or hives, swelling of the face, lips, or tongue -breathing problems -diarrhea that continues or is severe -lump or swelling on the neck -severe nausea -signs and symptoms of infection like fever or chills; cough; sore throat; pain or trouble passing urine -signs and symptoms of low blood sugar such as feeling anxious, confusion, dizziness, increased hunger, unusually weak or tired, sweating, shakiness, cold, irritable, headache, blurred vision, fast heartbeat, loss of consciousness -signs and symptoms of kidney injury like trouble passing urine or change in the amount of urine -trouble swallowing -unusual stomach upset or pain -vomiting Side effects that usually do not require medical attention (report to your doctor or health care professional if they continue or are bothersome): -constipation -decreased appetite -diarrhea -fatigue -headache -nausea -pain, redness, or irritation at site where injected -stomach upset -stuffy or runny nose This list may not describe all possible side effects. Call your doctor for medical advice about side effects. You may report side effects to FDA at 1-800-FDA-1088. Where should I keep my medicine? Keep out of the reach of children. Store unopened pen in a refrigerator between 2 and 8 degrees C (36 and 46 degrees F). Do not freeze or use if the medicine has been frozen. Protect from light and excessive heat. After you first use the pen, it can be stored at room temperature between 15 and 30 degrees C (59 and 86 degrees F) or in a refrigerator. Throw away your used pen after 30 days or after the expiration date, whichever comes  first. Do not store your pen with the needle attached. If the needle is left on, medicine may leak from the pen. NOTE: This sheet is a summary. It may not cover all possible information. If you have questions about this medicine, talk to your doctor, pharmacist, or health care provider.  2018 Elsevier/Gold Standard (2016-10-04 14:41:37)  

## 2017-08-19 NOTE — Assessment & Plan Note (Signed)
Will get restarted on his medicine. Rx for tramadol given today- should last 3 months.

## 2017-08-19 NOTE — Assessment & Plan Note (Signed)
Will start on wellbutrin. Will look into saxenda. Call with any concerns. Continue to monitor.

## 2017-08-19 NOTE — Assessment & Plan Note (Signed)
Not under good control. Off his medication. Will restart medicine. Call with any concerns. Recheck 1 month.

## 2017-08-21 ENCOUNTER — Telehealth: Payer: Self-pay | Admitting: Family Medicine

## 2017-08-21 NOTE — Telephone Encounter (Signed)
He was given a printed copy of this Rx on 11/19.

## 2017-08-21 NOTE — Telephone Encounter (Signed)
Refill request from [facility] on patients tramadol 50mg  Quantity 90   Thanks

## 2017-08-30 ENCOUNTER — Telehealth: Payer: Self-pay | Admitting: Family Medicine

## 2017-08-30 NOTE — Telephone Encounter (Signed)
Prior Auth was approved.

## 2017-08-30 NOTE — Telephone Encounter (Signed)
PA submitted. Pending review

## 2017-08-30 NOTE — Telephone Encounter (Signed)
Tramadol   Pre  Authorization  Request

## 2017-08-30 NOTE — Telephone Encounter (Unsigned)
Copied from [place] [id number]. Topic: General - Other >> Aug 30, 2017 11:27 AM Neva Seat wrote:  Dr. Wynetta Emery wrote Rx for 30 day supply of Tramadol.  [place] states [employer] has approved only 8 days of  Tramadol for pt.   Insurance will need a pre authorization for the 30 day supply of Rx by Dr. Wynetta Emery

## 2017-09-19 ENCOUNTER — Encounter: Payer: Self-pay | Admitting: Family Medicine

## 2017-09-19 ENCOUNTER — Ambulatory Visit (INDEPENDENT_AMBULATORY_CARE_PROVIDER_SITE_OTHER): Payer: BLUE CROSS/BLUE SHIELD | Admitting: Family Medicine

## 2017-09-19 VITALS — BP 150/88 | HR 60 | Temp 98.4°F | Wt 279.4 lb

## 2017-09-19 DIAGNOSIS — M170 Bilateral primary osteoarthritis of knee: Secondary | ICD-10-CM

## 2017-09-19 DIAGNOSIS — E291 Testicular hypofunction: Secondary | ICD-10-CM

## 2017-09-19 DIAGNOSIS — R208 Other disturbances of skin sensation: Secondary | ICD-10-CM

## 2017-09-19 DIAGNOSIS — K6289 Other specified diseases of anus and rectum: Secondary | ICD-10-CM | POA: Diagnosis not present

## 2017-09-19 DIAGNOSIS — Z23 Encounter for immunization: Secondary | ICD-10-CM

## 2017-09-19 DIAGNOSIS — N529 Male erectile dysfunction, unspecified: Secondary | ICD-10-CM

## 2017-09-19 MED ORDER — SILDENAFIL CITRATE 100 MG PO TABS: 50.0000 mg | ORAL_TABLET | Freq: Every day | ORAL | 11 refills | Status: DC | PRN

## 2017-09-19 MED ORDER — OMEPRAZOLE 40 MG PO CPDR
40.0000 mg | DELAYED_RELEASE_CAPSULE | Freq: Every day | ORAL | 3 refills | Status: DC
Start: 2017-09-19 — End: 2018-02-21

## 2017-09-19 MED ORDER — TESTOSTERONE CYPIONATE 200 MG/ML IM SOLN: INTRAMUSCULAR | 1 refills | Status: DC

## 2017-09-19 NOTE — Assessment & Plan Note (Addendum)
Stable on medication. However, having trouble with the pharmacy. Checking with them to see what's going on. Likely needs a PA- await results.

## 2017-09-19 NOTE — Progress Notes (Signed)
BP (!) 150/88 (BP Location: Left Arm, Patient Position: Sitting, Cuff Size: Large)   Pulse 60   Temp 98.4 F (36.9 C)   Wt 279 lb 7 oz (126.8 kg)   SpO2 95%   BMI 42.80 kg/m    Subjective:    Patient ID: Andrew [name], male    DOB: 08-07-1956, 61 y.o.   MRN: [id number]  HPI: Andrew [name] is a 61 y.o. male  Chief Complaint  Patient presents with  . Medication Refill    Testosterone   LOW TESTOSTERONE Duration: chronic Status: controlled  Satisfied with current treatment:  no Previous testosterone therapies: only the shots Medication side effects:  no Medication compliance: excellent compliance Decreased libido: no Fatigue: no Depressed mood: no Muscle weakness: no Erectile dysfunction: yes- has been on viagra in the past and did well on it.   Has has been having trouble getting his tramadol filled. Has only been getting 21 days at a time.   Has been having burning in his rectum when he eats cheese or anything acidic. Making him feel quite uncomfortable. Not sure what to do with it.   Relevant past medical, surgical, family and social history reviewed and updated as indicated. Interim medical history since our last visit reviewed. Allergies and medications reviewed and updated.  Review of Systems  Constitutional: Negative.   Respiratory: Negative.   Cardiovascular: Negative.   Musculoskeletal: Positive for arthralgias. Negative for back pain, gait problem, joint swelling, myalgias, neck pain and neck stiffness.  Neurological: Negative.   Psychiatric/Behavioral: Negative.     Per HPI unless specifically indicated above     Objective:    BP (!) 150/88 (BP Location: Left Arm, Patient Position: Sitting, Cuff Size: Large)   Pulse 60   Temp 98.4 F (36.9 C)   Wt 279 lb 7 oz (126.8 kg)   SpO2 95%   BMI 42.80 kg/m   Wt Readings from Last 3 Encounters:  09/19/17 279 lb 7 oz (126.8 kg)  08/19/17 288 lb 7 oz (130.8 kg)  03/29/17 277 lb (125.6 kg)      Physical Exam  Constitutional: He is oriented to person, place, and time. He appears well-developed and well-nourished. No distress.  HENT:  Head: Normocephalic and atraumatic.  Right Ear: Hearing normal.  Left Ear: Hearing normal.  Nose: Nose normal.  Eyes: Conjunctivae and lids are normal. Right eye exhibits no discharge. Left eye exhibits no discharge. No scleral icterus.  Cardiovascular: Normal rate, regular rhythm, normal heart sounds and intact distal pulses. Exam reveals no gallop and no friction rub.  No murmur heard. Pulmonary/Chest: Effort normal and breath sounds normal. No respiratory distress. He has no wheezes. He has no rales. He exhibits no tenderness.  Musculoskeletal: Normal range of motion.  Neurological: He is alert and oriented to person, place, and time.  Skin: Skin is warm, dry and intact. No rash noted. He is not diaphoretic. No erythema. No pallor.  Psychiatric: He has a normal mood and affect. His speech is normal and behavior is normal. Judgment and thought content normal. Cognition and memory are normal.  Nursing note and vitals reviewed.   Results for orders placed or performed in visit on 03/29/17  Microscopic Examination  Result Value Ref Range   Epithelial Cells (non renal) 0-10 0 - 10 /hpf   Casts Present None seen /lpf   Cast Type Hyaline casts N/A   Bacteria, UA Few (A) None seen/Few  UA/M w/rflx Culture, Routine (STAT)  Result  Value Ref Range   Specific Gravity, UA 1.025 1.005 - 1.030   pH, UA 5.5 5.0 - 7.5   Color, UA Yellow Yellow   Appearance Ur Clear Clear   Leukocytes, UA Negative Negative   Protein, UA 1+ (A) Negative/Trace   Glucose, UA Negative Negative   Ketones, UA Trace (A) Negative   RBC, UA Negative Negative   Bilirubin, UA Negative Negative   Urobilinogen, Ur 1.0 0.2 - 1.0 mg/dL   Nitrite, UA Negative Negative   Microscopic Examination See below:   CBC with Differential/Platelet  Result Value Ref Range   WBC 8.6 3.4 - 10.8  x10E3/uL   RBC 5.10 4.14 - 5.80 x10E6/uL   Hemoglobin 16.2 13.0 - 17.7 g/dL   Hematocrit 47.6 37.5 - 51.0 %   MCV 93 79 - 97 fL   MCH 31.8 26.6 - 33.0 pg   MCHC 34.0 31.5 - 35.7 g/dL   RDW 15.0 12.3 - 15.4 %   Platelets 165 150 - 379 x10E3/uL   Neutrophils 66 Not Estab. %   Lymphs 21 Not Estab. %   Monocytes 9 Not Estab. %   Eos 3 Not Estab. %   Basos 1 Not Estab. %   Neutrophils Absolute 5.7 1.4 - 7.0 x10E3/uL   Lymphocytes Absolute 1.8 0.7 - 3.1 x10E3/uL   Monocytes Absolute 0.8 0.1 - 0.9 x10E3/uL   EOS (ABSOLUTE) 0.3 0.0 - 0.4 x10E3/uL   Basophils Absolute 0.0 0.0 - 0.2 x10E3/uL   Immature Granulocytes 0 Not Estab. %   Immature Grans (Abs) 0.0 0.0 - 0.1 x10E3/uL  Comprehensive metabolic panel  Result Value Ref Range   Glucose 97 65 - 99 mg/dL   BUN 23 6 - 24 mg/dL   Creatinine, Ser 1.22 0.76 - 1.27 mg/dL   GFR calc non Af Amer 64 >59 mL/min/1.73   GFR calc Af Amer 75 >59 mL/min/1.73   BUN/Creatinine Ratio 19 9 - 20   Sodium 140 134 - 144 mmol/L   Potassium 5.3 (H) 3.5 - 5.2 mmol/L   Chloride 102 96 - 106 mmol/L   CO2 23 20 - 29 mmol/L   Calcium 9.1 8.7 - 10.2 mg/dL   Total Protein 6.5 6.0 - 8.5 g/dL   Albumin 4.5 3.5 - 5.5 g/dL   Globulin, Total 2.0 1.5 - 4.5 g/dL   Albumin/Globulin Ratio 2.3 (H) 1.2 - 2.2   Bilirubin Total 1.4 (H) 0.0 - 1.2 mg/dL   Alkaline Phosphatase 81 39 - 117 IU/L   AST 29 0 - 40 IU/L   ALT 41 0 - 44 IU/L      Assessment & Plan:   Problem List Items Addressed This Visit      Endocrine   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 Rechecking levels today. Call with any concerns. Refill given today.       Relevant Orders   Testosterone, free, total   PSA   Comprehensive metabolic panel     Musculoskeletal and Integument   Primary osteoarthritis of both knees    Stable on medication. However, having trouble with the pharmacy. Checking with them to see what's going on. Likely needs a PA- await results.        Other  Visit Diagnoses    Immunization due       Flu shot given today.   Relevant Orders   Flu Vaccine QUAD 6+ mos PF IM (Fluarix Quad PF) (Completed)   Erectile dysfunction, unspecified erectile  dysfunction type       Did well with viagra in the past. New Rx given. Call with any concerns.    Rectal burning       Will start some omeprazole. Call with any concerns.        Follow up plan: Return in about 6 months (around 03/20/2018) for Physical.

## 2017-09-19 NOTE — Assessment & Plan Note (Signed)
Stable on current regimen. Rechecking levels today. Call with any concerns. Refill given today.

## 2017-09-20 ENCOUNTER — Encounter: Payer: Self-pay | Admitting: Family Medicine

## 2017-09-20 LAB — COMPREHENSIVE METABOLIC PANEL
ALT: 36 IU/L (ref 0–44)
AST: 23 IU/L (ref 0–40)
Albumin/Globulin Ratio: 2.4 — ABNORMAL HIGH (ref 1.2–2.2)
Albumin: 4.5 g/dL (ref 3.5–5.5)
Alkaline Phosphatase: 88 IU/L (ref 39–117)
BUN/Creatinine Ratio: 19 (ref 9–20)
BUN: 22 mg/dL (ref 6–24)
Bilirubin Total: 1 mg/dL (ref 0.0–1.2)
CALCIUM: 9.4 mg/dL (ref 8.7–10.2)
CO2: 26 mmol/L (ref 20–29)
CREATININE: 1.18 mg/dL (ref 0.76–1.27)
Chloride: 102 mmol/L (ref 96–106)
GFR calc Af Amer: 78 mL/min/{1.73_m2} (ref >59)
GFR, EST NON AFRICAN AMERICAN: 67 mL/min/{1.73_m2} (ref >59)
Globulin, Total: 1.9 g/dL (ref 1.5–4.5)
Glucose: 108 mg/dL — ABNORMAL HIGH (ref 65–99)
Potassium: 4.7 mmol/L (ref 3.5–5.2)
Sodium: 141 mmol/L (ref 134–144)
TOTAL PROTEIN: 6.4 g/dL (ref 6.0–8.5)

## 2017-09-20 LAB — PSA: PROSTATE SPECIFIC AG, SERUM: 1.1 ng/mL (ref 0.0–4.0)

## 2017-09-20 LAB — TESTOSTERONE, FREE, TOTAL, SHBG
SEX HORMONE BINDING: 31.8 nmol/L (ref 19.3–76.4)
Testosterone, Free: 6.8 pg/mL — ABNORMAL LOW (ref 7.2–24.0)
Testosterone: 244 ng/dL — ABNORMAL LOW (ref 264–916)

## 2017-10-02 ENCOUNTER — Encounter: Payer: Self-pay | Admitting: General Surgery

## 2017-12-16 ENCOUNTER — Encounter: Payer: Self-pay | Admitting: Family Medicine

## 2017-12-16 ENCOUNTER — Ambulatory Visit (INDEPENDENT_AMBULATORY_CARE_PROVIDER_SITE_OTHER): Payer: BLUE CROSS/BLUE SHIELD | Admitting: Family Medicine

## 2017-12-16 VITALS — BP 142/99 | HR 73 | Wt 283.0 lb

## 2017-12-16 DIAGNOSIS — J300 Vasomotor rhinitis: Secondary | ICD-10-CM

## 2017-12-16 DIAGNOSIS — R682 Dry mouth, unspecified: Secondary | ICD-10-CM

## 2017-12-16 DIAGNOSIS — R9389 Abnormal findings on diagnostic imaging of other specified body structures: Secondary | ICD-10-CM

## 2017-12-16 DIAGNOSIS — J0100 Acute maxillary sinusitis, unspecified: Secondary | ICD-10-CM

## 2017-12-16 DIAGNOSIS — R930 Abnormal findings on diagnostic imaging of skull and head, not elsewhere classified: Secondary | ICD-10-CM

## 2017-12-16 MED ORDER — AMOXICILLIN-POT CLAVULANATE 875-125 MG PO TABS: 1.0000 | ORAL_TABLET | Freq: Two times a day (BID) | ORAL | 0 refills | Status: DC

## 2017-12-16 NOTE — Patient Instructions (Addendum)
[facility] [address]  [place], [place] [address]  Phone: [phone]  Fax: [phone]   Nonallergic Rhinitis Nonallergic rhinitis is a condition that causes symptoms that affect the nose, such as a runny nose and a stuffed-up nose (nasal congestion) that can make it hard to breathe through the nose. This condition is different from having an allergy (allergic rhinitis). Allergic rhinitis occurs when the body's defense system (immune system) reacts to a substance that you are allergic to (allergen), such as pollen, pet dander, mold, or dust. Nonallergic rhinitis has many similar symptoms, but it is not caused by allergens. Nonallergic rhinitis can be a short-term or long-term problem. What are the causes? This condition can be caused by many different things. Some common types of nonallergic rhinitis include: Infectious rhinitis  This is usually due to an infection in the upper respiratory tract. Vasomotor rhinitis  This is the most common type of long-term nonallergic rhinitis.  It is caused by too much blood flow through the nose, which makes the tissue inside of the nose swell.  Symptoms are often triggered by strong odors, cold air, stress, drinking alcohol, cigarette smoke, or changes in the weather. Occupational rhinitis  This type is caused by triggers in the workplace, such as chemicals, dusts, animal dander, or air pollution. Hormonal rhinitis  This type occurs in women as a result of an increase in the male hormone estrogen.  It may occur during pregnancy, puberty, and menstrual cycles.  Symptoms improve when estrogen levels drop. Drug-induced rhinitis Several drugs can cause nonallergic rhinitis, including:  Medicines that are used to treat high blood pressure, heart disease, and Parkinson disease.  Aspirin and NSAIDs.  Over-the-counter nasal decongestant sprays. These can cause a type of nonallergic rhinitis (rhinitis medicamentosa) when they  are used for more than a few days.  Nonallergic rhinitis with eosinophilia syndrome (NARES)  This type is caused by having too much of a certain type of white blood cell (eosinophil). Nonallergic rhinitis can also be caused by a reaction to eating hot or spicy foods. This does not usually cause long-term symptoms. In some cases, the cause of nonallergic rhinitis is not known. What increases the risk? You are more likely to develop this condition if:  You are 3-20 years of age.  You are a woman. Women are twice as likely to have this condition.  What are the signs or symptoms? Common symptoms of this condition include:  Nasal congestion.  Runny nose.  The feeling of mucus going down the back of the throat (postnasal drip).  Trouble sleeping at night and daytime sleepiness.  Less common symptoms include:  Sneezing.  Coughing.  Itchy nose.  Bloodshot eyes.  How is this diagnosed? This condition may be diagnosed based on:  Your symptoms and medical history.  A physical exam.  Allergy testing to rule out allergic rhinitis. You may have skin tests or blood tests.  In some cases, the health care provider may take a swab of nasal secretions to look for an increased number of eosinophils. This would be done to confirm a diagnosis of NARES. How is this treated? Treatment for this condition depends on the cause. No single treatment works for everyone. Work with your health care provider to find the best treatment for you. Treatment may include:  Avoiding the things that trigger your symptoms.  Using medicines to relieve congestion, such as: ? Steroid nasal spray. There are many types. You may need to try a few to  find out which one works best. ? Decongestant medicine. This may be an oral medicine or a nasal spray. These medicines are only used for a short time.  Using medicines to relieve a runny nose. These may include antihistamine medicines or anticholinergic nasal  sprays.  Surgery to remove tissue from inside the nose may be needed in severe cases if the condition has not improved after 6-12 months of medical treatment. Follow these instructions at home:  Take or use over-the-counter and prescription medicines only as told by your health care provider. Do not stop using your medicine even if you start to feel better.  Use salt-water (saline) rinses or other solutions (nasal washes or irrigations) to wash or rinse out the inside of your nose as told by your health care provider.  Do not take NSAIDs or medicines that contain aspirin if they make your symptoms worse.  Do not drink alcohol if it makes your symptoms worse.  Do not use any tobacco products, such as cigarettes, chewing tobacco, and e-cigarettes. If you need help quitting, ask your health care provider.  Avoid secondhand smoke.  Get some exercise every day. Exercise may help reduce symptoms of nonallergic rhinitis for some people. Ask your health care provider how much exercise and what types of exercise are safe for you.  Sleep with the head of your bed raised (elevated). This may reduce nighttime nasal congestion.  Keep all follow-up visits as told by your health care provider. This is important. Contact a health care provider if:  You have a fever.  Your symptoms are getting worse at home.  Your symptoms are not responding to medicine.  You develop new symptoms, especially a headache or nosebleed. This information is not intended to replace advice given to you by your health care provider. Make sure you discuss any questions you have with your health care provider. Document Released: 01/09/2016 Document Revised: 02/23/2016 Document Reviewed: 12/08/2015 Elsevier Interactive Patient Education  2018 [employer].

## 2017-12-16 NOTE — Progress Notes (Signed)
BP (!) 142/99   Pulse 73   Wt 283 lb (128.4 kg)   SpO2 97%   BMI 43.35 kg/m    Subjective:    Patient ID: Andrew [name], male    DOB: 1955-11-03, 62 y.o.   MRN: [id number]  HPI: Andrew [name] is a 62 y.o. male  Chief Complaint  Patient presents with  . Referral    ENT. Was told by dentist to go see ENT.  Pt's only concern is nasal congestion w/ eating.    Andrew [name] presents today at the recommendation of his dentist. He had a crown break and has been working with her to get his teeth back in good order. She did a panoramic x-ray and was very concerned about his R sinus. She suggested that he see an ENT ASAP.  X-ray is not available to be seen today. He states that it looked very "cloudy-" no further information. He notes that he has been feeling a bit under the weather. His wife was sick for most of February, and then he was sick at the end of February. He didn't get evaluated as he thought it would run it's course. It did, and he started to feel better a few days before he went to see his dentist. He notes that he is feeling fine now. No pain in his sinuses. No fevers. No chills. No sneezing. The only thing that seems to be happening is that his nose starts running a lot when he's eating or preparing food and his mouth is very dry. No other concerns or complaints at this time.   Relevant past medical, surgical, family and social history reviewed and updated as indicated. Interim medical history since our last visit reviewed. Allergies and medications reviewed and updated.  Review of Systems  Constitutional: Negative.   HENT: Positive for rhinorrhea. Negative for congestion, dental problem, drooling, ear discharge, ear pain, facial swelling, hearing loss, mouth sores, nosebleeds, postnasal drip, sinus pressure, sinus pain, sneezing, sore throat, tinnitus, trouble swallowing and voice change.   Eyes: Negative.   Respiratory: Negative.   Cardiovascular: Negative.   Gastrointestinal:  Negative.   Psychiatric/Behavioral: Negative.     Per HPI unless specifically indicated above     Objective:    BP (!) 142/99   Pulse 73   Wt 283 lb (128.4 kg)   SpO2 97%   BMI 43.35 kg/m   Wt Readings from Last 3 Encounters:  12/16/17 283 lb (128.4 kg)  09/19/17 279 lb 7 oz (126.8 kg)  08/19/17 288 lb 7 oz (130.8 kg)    Physical Exam  Constitutional: He is oriented to person, place, and time. He appears well-developed and well-nourished. No distress.  HENT:  Head: Normocephalic and atraumatic.  Right Ear: Hearing and external ear normal.  Left Ear: Hearing and external ear normal.  Nose: Nose normal.  Mouth/Throat: Oropharynx is clear and moist. No oropharyngeal exudate.  Eyes: Conjunctivae, EOM and lids are normal. Pupils are equal, round, and reactive to light. Right eye exhibits no discharge. Left eye exhibits no discharge. No scleral icterus.  Neck: Normal range of motion. Neck supple. No JVD present. No tracheal deviation present. No thyromegaly present.  Cardiovascular: Normal rate, regular rhythm, normal heart sounds and intact distal pulses. Exam reveals no gallop and no friction rub.  No murmur heard. Pulmonary/Chest: Effort normal and breath sounds normal. No stridor. No respiratory distress. He has no wheezes. He has no rales. He exhibits no tenderness.  Musculoskeletal: Normal range  of motion.  Lymphadenopathy:    He has no cervical adenopathy.  Neurological: He is alert and oriented to person, place, and time.  Skin: Skin is warm, dry and intact. No rash noted. He is not diaphoretic. No erythema. No pallor.  Psychiatric: He has a normal mood and affect. His speech is normal and behavior is normal. Judgment and thought content normal. Cognition and memory are normal.  Nursing note and vitals reviewed.   Results for orders placed or performed in visit on 09/19/17  Testosterone, free, total  Result Value Ref Range   Testosterone 244 (L) 264 - 916 ng/dL    Testosterone, Free 6.8 (L) 7.2 - 24.0 pg/mL   Sex Hormone Binding 31.8 19.3 - 76.4 nmol/L  PSA  Result Value Ref Range   Prostate Specific Ag, Serum 1.1 0.0 - 4.0 ng/mL  Comprehensive metabolic panel  Result Value Ref Range   Glucose 108 (H) 65 - 99 mg/dL   BUN 22 6 - 24 mg/dL   Creatinine, Ser 1.18 0.76 - 1.27 mg/dL   GFR calc non Af Amer 67 >59 mL/min/1.73   GFR calc Af Amer 78 >59 mL/min/1.73   BUN/Creatinine Ratio 19 9 - 20   Sodium 141 134 - 144 mmol/L   Potassium 4.7 3.5 - 5.2 mmol/L   Chloride 102 96 - 106 mmol/L   CO2 26 20 - 29 mmol/L   Calcium 9.4 8.7 - 10.2 mg/dL   Total Protein 6.4 6.0 - 8.5 g/dL   Albumin 4.5 3.5 - 5.5 g/dL   Globulin, Total 1.9 1.5 - 4.5 g/dL   Albumin/Globulin Ratio 2.4 (H) 1.2 - 2.2   Bilirubin Total 1.0 0.0 - 1.2 mg/dL   Alkaline Phosphatase 88 39 - 117 IU/L   AST 23 0 - 40 IU/L   ALT 36 0 - 44 IU/L      Assessment & Plan:   Problem List Items Addressed This Visit    None    Visit Diagnoses    Abnormal x-ray of paranasal sinus    -  Primary   Not available for review today. He will pick up before he sees ENT and bring copy of x-ray with him. Referral generated. Will treat sinusitis.   Relevant Orders   Ambulatory referral to ENT   Acute maxillary sinusitis, recurrence not specified       Will treat with augmentin, and at patient's request- will get him into ENT. Need to be able to see x-ray and compare.    Relevant Medications   amoxicillin-clavulanate (AUGMENTIN) 875-125 MG tablet   Other Relevant Orders   Ambulatory referral to ENT   Vasomotor rhinitis       Information provided to patient. Discussed flonase. Call with any concerns.    Relevant Orders   Ambulatory referral to ENT   Dry mouth       Likely due to medications. Will start biotene. Call with any concerns.        Follow up plan: Return if symptoms worsen or fail to improve.

## 2017-12-18 ENCOUNTER — Other Ambulatory Visit: Payer: Self-pay | Admitting: Family Medicine

## 2017-12-29 ENCOUNTER — Other Ambulatory Visit: Payer: Self-pay | Admitting: Family Medicine

## 2018-01-12 ENCOUNTER — Other Ambulatory Visit: Payer: Self-pay | Admitting: Family Medicine

## 2018-01-12 NOTE — Telephone Encounter (Signed)
6 month supply sent in in Nov- should not be due yet.

## 2018-01-15 ENCOUNTER — Other Ambulatory Visit: Payer: Self-pay | Admitting: Family Medicine

## 2018-01-15 NOTE — Telephone Encounter (Signed)
Patient is requesting 30 day supply, 90pills

## 2018-01-15 NOTE — Telephone Encounter (Signed)
Rx refill request: tramadol 50 mg  LOV: 12/16/17  PCP: Vincent: verified

## 2018-01-16 NOTE — Telephone Encounter (Signed)
Spoke with pt and he stated that the pharmacy didn't receive his prescription and I let him know I would send it in again. He stated that he will call them and call the office back before 4 if the pharmacy has not got it.

## 2018-02-21 ENCOUNTER — Other Ambulatory Visit: Payer: Self-pay | Admitting: Family Medicine

## 2018-02-25 NOTE — Telephone Encounter (Signed)
NT not allowed to fill Voltaren   HCTZ refill Last OV: 08/19/17---- Pt has upcoming appt 03/28/18 Last Refill:08/19/17 Pharmacy:Megan Johnson DO  Metoprolol refill Last OV: 08/19/17 Last Refill:08/19/17 #90 tab 1 RF  Buproprion refill Last OV: 08/19/17  Last Refill:08/19/17 #180 tab 1 RF  Omeprazole refill Last OV: 09/19/17 Last Refill:09/19/17#30 cap 3 RF   gabapentin refill Last OV: 08/19/17  Last Refill:08/19/17 #180 cap 1 RF   benazepril refill Last OV: 08/19/17 Last Refill:08/19/17 #90 tab 1 RF   diclofenac refill Last OV: 09/19/17 Last Refill:08/19/17 #180 tabs 1 RF

## 2018-02-28 ENCOUNTER — Telehealth: Payer: Self-pay | Admitting: Family Medicine

## 2018-02-28 NOTE — Telephone Encounter (Signed)
Copied from [place] [phone]. Topic: Quick Communication - See Telephone Encounter >> Feb 28, 2018 10:58 AM Hewitt Shorts wrote: Pt is aware that the metoprolol has been approved to be sent to his mail order pharmacy but is needing a few sent to his local pharmacy til those come in sent to [facility] number [phone]

## 2018-02-28 NOTE — Telephone Encounter (Signed)
Pt will out  be this weekend  [facility] [address]

## 2018-03-01 MED ORDER — METOPROLOL SUCCINATE ER 100 MG PO TB24: ORAL_TABLET | ORAL | 1 refills | Status: DC

## 2018-03-17 ENCOUNTER — Telehealth: Payer: Self-pay | Admitting: Family Medicine

## 2018-03-19 MED ORDER — TRAMADOL HCL 50 MG PO TABS: 100.0000 mg | ORAL_TABLET | Freq: Every day | ORAL | 1 refills | Status: DC

## 2018-03-19 NOTE — Telephone Encounter (Signed)
Ultram 50 mg refill request  LOV 12/16/17 with Johnson  Last refill:  12/30/17  #90  1 refill  [facility] [place], [place] - [address]

## 2018-03-19 NOTE — Addendum Note (Signed)
Addended by: Valerie Roys on: 03/19/2018 05:21 PM   Modules accepted: Orders

## 2018-03-19 NOTE — Telephone Encounter (Signed)
Patient states that it is time for the tramadol to be filled.  He said he to please call [facility] to verify if you need to and he also said he would be in on Friday.  Thank you

## 2018-03-28 ENCOUNTER — Encounter: Payer: Self-pay | Admitting: Family Medicine

## 2018-03-28 ENCOUNTER — Ambulatory Visit (INDEPENDENT_AMBULATORY_CARE_PROVIDER_SITE_OTHER): Payer: BLUE CROSS/BLUE SHIELD | Admitting: Family Medicine

## 2018-03-28 VITALS — BP 137/84 | HR 63 | Temp 98.1°F | Wt 279.3 lb

## 2018-03-28 DIAGNOSIS — Z23 Encounter for immunization: Secondary | ICD-10-CM | POA: Diagnosis not present

## 2018-03-28 DIAGNOSIS — Z0000 Encounter for general adult medical examination without abnormal findings: Secondary | ICD-10-CM

## 2018-03-28 DIAGNOSIS — Z79899 Other long term (current) drug therapy: Secondary | ICD-10-CM | POA: Diagnosis not present

## 2018-03-28 DIAGNOSIS — Z6841 Body Mass Index (BMI) 40.0 and over, adult: Secondary | ICD-10-CM

## 2018-03-28 DIAGNOSIS — M170 Bilateral primary osteoarthritis of knee: Secondary | ICD-10-CM

## 2018-03-28 DIAGNOSIS — I129 Hypertensive chronic kidney disease with stage 1 through stage 4 chronic kidney disease, or unspecified chronic kidney disease: Secondary | ICD-10-CM

## 2018-03-28 DIAGNOSIS — Z0001 Encounter for general adult medical examination with abnormal findings: Secondary | ICD-10-CM

## 2018-03-28 DIAGNOSIS — E291 Testicular hypofunction: Secondary | ICD-10-CM

## 2018-03-28 LAB — UA/M W/RFLX CULTURE, ROUTINE
Bilirubin, UA: NEGATIVE
Glucose, UA: NEGATIVE
Ketones, UA: NEGATIVE
Leukocytes, UA: NEGATIVE
NITRITE UA: NEGATIVE
PH UA: 6 (ref 5.0–7.5)
Protein, UA: NEGATIVE
RBC, UA: NEGATIVE
Specific Gravity, UA: 1.015 (ref 1.005–1.030)
UUROB: 0.2 mg/dL (ref 0.2–1.0)

## 2018-03-28 MED ORDER — METOPROLOL SUCCINATE ER 100 MG PO TB24: ORAL_TABLET | ORAL | 1 refills | Status: DC

## 2018-03-28 MED ORDER — OMEPRAZOLE 40 MG PO CPDR: 40.0000 mg | DELAYED_RELEASE_CAPSULE | Freq: Every day | ORAL | 1 refills | Status: DC

## 2018-03-28 MED ORDER — TESTOSTERONE CYPIONATE 200 MG/ML IM SOLN: INTRAMUSCULAR | 5 refills | Status: DC

## 2018-03-28 NOTE — Assessment & Plan Note (Signed)
Under good control. Continue current regimen. Continue to monitor. Call with any concerns. Refills given.  

## 2018-03-28 NOTE — Assessment & Plan Note (Signed)
Rechecking levels today. Refills given. Call with any concerns.

## 2018-03-28 NOTE — Assessment & Plan Note (Signed)
For tramadol. See scanned document.

## 2018-03-28 NOTE — Progress Notes (Signed)
BP 137/84 (BP Location: Left Arm, Patient Position: Sitting, Cuff Size: Large)   Pulse 63   Temp 98.1 F (36.7 C)   Wt 279 lb 5 oz (126.7 kg)   SpO2 95%   BMI 42.78 kg/m    Subjective:    Patient ID: Andrew [name], male    DOB: Feb 10, 1956, 62 y.o.   MRN: [id number]  HPI: Andrew [name] is a 62 y.o. male presenting on 03/28/2018 for comprehensive medical examination. Current medical complaints include:  HYPERTENSION Hypertension status: controlled  Satisfied with current treatment? yes Duration of hypertension: chronic BP monitoring frequency:  not checking BP medication side effects:  no Medication compliance: excellent compliance Previous BP meds: metoprolol, benazepril, hctz Aspirin: no Recurrent headaches: no Visual changes: no Palpitations: no Dyspnea: no Chest pain: no Lower extremity edema: no Dizzy/lightheaded: no  CHRONIC PAIN  Present dose: 10 Morphine equivalents Pain control status: stable Duration: chronic Location: Bilateral knees Quality: aching and sore Current Pain Level: mild Previous Pain Level: moderate Breakthrough pain: yes Benefit from narcotic medications: yes What Activities task can be accomplished with current medication?: Able for him to do his ADLs and work Interested in Careers adviser off narcotics:no   Stool softners/OTC fiber: no  Previous pain specialty evaluation: no Non-narcotic analgesic meds: yes Narcotic contract: yes  LOW TESTOSTERONE Duration: chronic Status: controlled  Satisfied with current treatment:  yes Medication side effects:  no Medication compliance: excellent compliance Decreased libido: no Fatigue: no Depressed mood: no Muscle weakness: no Erectile dysfunction: no  He currently lives with: wife Interim Problems from his last visit: no  Depression Screen done today and results listed below:  Depression screen [facility] 2/9 03/28/2018 12/16/2017 02/22/2017 10/05/2015  Decreased Interest 0 0 0 0  Down, Depressed,  Hopeless 0 0 0 0  PHQ - 2 Score 0 0 0 0  Altered sleeping - 0 0 -  Tired, decreased energy - 0 0 -  Change in appetite - 0 0 -  Feeling bad or failure about yourself  - 0 0 -  Trouble concentrating - 0 0 -  Moving slowly or fidgety/restless - 0 0 -  Suicidal thoughts - 0 0 -  PHQ-9 Score - 0 0 -  Difficult doing work/chores Not difficult at all - - -    Past Medical History:  Past Medical History:  Diagnosis Date  . Chronic pain syndrome   . Depression   . Diverticulosis   . DVT (deep venous thrombosis) (HCC)    bilateral, was on anticoags for 45months  . Ear infection 08/2016   CLEARING UP-FINISHED ANTIBIOTIC AND PREDNISONE TAPER   . Family history of adverse reaction to anesthesia    BROTHER-N/V  . Hernia, umbilical   . History of kidney stones   . Hyperlipidemia   . Hypertension   . IFG (impaired fasting glucose)   . Knee pain, chronic   . Pes anserine bursitis     Surgical History:  Past Surgical History:  Procedure Laterality Date  . COLONOSCOPY  2015  . JOINT REPLACEMENT Bilateral 2009   replacement and revision  . KIDNEY STONE SURGERY    . UMBILICAL HERNIA REPAIR N/A 09/06/2016   Procedure: HERNIA REPAIR UMBILICAL ADULT;  Surgeon: Robert Bellow, MD;  Location: [facility] ORS;  Service: General;  Laterality: N/A;    Medications:  Current Outpatient Medications on File Prior to Visit  Medication Sig  . benazepril (LOTENSIN) 40 MG tablet TAKE 1 TABLET BY MOUTH  DAILY  .  buPROPion (WELLBUTRIN SR) 150 MG 12 hr tablet TAKE 1 TABLET BY MOUTH  TWICE A DAY  . diclofenac (VOLTAREN) 75 MG EC tablet TAKE 1 TABLET BY MOUTH  TWICE A DAY  . gabapentin (NEURONTIN) 300 MG capsule TAKE 1 CAPSULE BY MOUTH  TWICE DAILY  . hydrochlorothiazide (MICROZIDE) 12.5 MG capsule TAKE 1 CAPSULE BY MOUTH  DAILY  . sildenafil (VIAGRA) 100 MG tablet Take 0.5-1 tablets (50-100 mg total) by mouth daily as needed for erectile dysfunction.  . traMADol (ULTRAM) 50 MG tablet Take 2 tablets (100 mg  total) by mouth daily. with food   No current facility-administered medications on file prior to visit.     Allergies:  No Known Allergies  Social History:  Social History   Socioeconomic History  . Marital status: Married    Spouse name: Not on file  . Number of children: Not on file  . Years of education: Not on file  . Highest education level: Not on file  Occupational History  . Not on file  Social Needs  . Financial resource strain: Not on file  . Food insecurity:    Worry: Not on file    Inability: Not on file  . Transportation needs:    Medical: Not on file    Non-medical: Not on file  Tobacco Use  . Smoking status: Former Smoker    Packs/day: 1.50    Years: 25.00    Pack years: 37.50    Types: Cigarettes    Last attempt to quit: 06/28/2011    Years since quitting: 6.7  . Smokeless tobacco: Never Used  Substance and Sexual Activity  . Alcohol use: Yes    Comment: on occasion  . Drug use: No  . Sexual activity: Yes    Birth control/protection: None  Lifestyle  . Physical activity:    Days per week: Not on file    Minutes per session: Not on file  . Stress: Not on file  Relationships  . Social connections:    Talks on phone: Not on file    Gets together: Not on file    Attends religious service: Not on file    Active member of club or organization: Not on file    Attends meetings of clubs or organizations: Not on file    Relationship status: Not on file  . Intimate partner violence:    Fear of current or ex partner: Not on file    Emotionally abused: Not on file    Physically abused: Not on file    Forced sexual activity: Not on file  Other Topics Concern  . Not on file  Social History Narrative  . Not on file   Social History   Tobacco Use  Smoking Status Former Smoker  . Packs/day: 1.50  . Years: 25.00  . Pack years: 37.50  . Types: Cigarettes  . Last attempt to quit: 06/28/2011  . Years since quitting: 6.7  Smokeless Tobacco Never Used     Social History   Substance and Sexual Activity  Alcohol Use Yes   Comment: on occasion    Family History:  Family History  Problem Relation Age of Onset  . Alcohol abuse Father   . Hypertension Father   . Cirrhosis Father   . Hypertension Brother   . Stroke Paternal Grandmother   . Stroke Paternal Grandfather   . Pneumonia Mother     Past medical history, surgical history, medications, allergies, family history and social history reviewed with  patient today and changes made to appropriate areas of the chart.   Review of Systems  Constitutional: Negative.   HENT: Negative.        Runny nose  Eyes: Negative.   Respiratory: Positive for shortness of breath and wheezing. Negative for cough, hemoptysis and sputum production.   Cardiovascular: Negative.   Gastrointestinal: Negative.   Genitourinary: Negative.   Musculoskeletal: Positive for joint pain and myalgias. Negative for back pain, falls and neck pain.  Skin: Negative.        Skin tags that are irritated  Neurological: Negative.   Endo/Heme/Allergies: Negative for environmental allergies and polydipsia. Bruises/bleeds easily.  Psychiatric/Behavioral: Negative.     All other ROS negative except what is listed above and in the HPI.      Objective:    BP 137/84 (BP Location: Left Arm, Patient Position: Sitting, Cuff Size: Large)   Pulse 63   Temp 98.1 F (36.7 C)   Wt 279 lb 5 oz (126.7 kg)   SpO2 95%   BMI 42.78 kg/m   Wt Readings from Last 3 Encounters:  03/28/18 279 lb 5 oz (126.7 kg)  12/16/17 283 lb (128.4 kg)  09/19/17 279 lb 7 oz (126.8 kg)    Physical Exam  Constitutional: He is oriented to person, place, and time. He appears well-developed and well-nourished. No distress.  HENT:  Head: Normocephalic and atraumatic.  Right Ear: Hearing, tympanic membrane, external ear and ear canal normal.  Left Ear: Hearing, tympanic membrane, external ear and ear canal normal.  Nose: Nose normal.   Mouth/Throat: Uvula is midline, oropharynx is clear and moist and mucous membranes are normal. No oropharyngeal exudate.  Eyes: Pupils are equal, round, and reactive to light. Conjunctivae, EOM and lids are normal. Right eye exhibits no discharge. Left eye exhibits no discharge. No scleral icterus.  Neck: Normal range of motion. Neck supple. No JVD present. No tracheal deviation present. No thyromegaly present.  Cardiovascular: Normal rate, regular rhythm, normal heart sounds and intact distal pulses. Exam reveals no gallop and no friction rub.  No murmur heard. Pulmonary/Chest: Effort normal and breath sounds normal. No stridor. No respiratory distress. He has no wheezes. He has no rales. He exhibits no tenderness.  Abdominal: Soft. Bowel sounds are normal. He exhibits no distension and no mass. There is no tenderness. There is no rebound and no guarding. No hernia.  Genitourinary:  Genitourinary Comments: Penis and prostate exam deferred- done at Urology  Musculoskeletal: Normal range of motion. He exhibits no edema, tenderness or deformity.  Lymphadenopathy:    He has no cervical adenopathy.  Neurological: He is alert and oriented to person, place, and time. He displays normal reflexes. No cranial nerve deficit or sensory deficit. He exhibits normal muscle tone. Coordination normal.  Skin: Skin is warm, dry and intact. Capillary refill takes less than 2 seconds. No rash noted. He is not diaphoretic. No erythema. No pallor.  Psychiatric: He has a normal mood and affect. His speech is normal and behavior is normal. Judgment and thought content normal. Cognition and memory are normal.  Nursing note and vitals reviewed.   Results for orders placed or performed in visit on 09/19/17  Testosterone, free, total  Result Value Ref Range   Testosterone 244 (L) 264 - 916 ng/dL   Testosterone, Free 6.8 (L) 7.2 - 24.0 pg/mL   Sex Hormone Binding 31.8 19.3 - 76.4 nmol/L  PSA  Result Value Ref Range    Prostate Specific Ag, Serum 1.1 0.0 -  4.0 ng/mL  Comprehensive metabolic panel  Result Value Ref Range   Glucose 108 (H) 65 - 99 mg/dL   BUN 22 6 - 24 mg/dL   Creatinine, Ser 1.18 0.76 - 1.27 mg/dL   GFR calc non Af Amer 67 >59 mL/min/1.73   GFR calc Af Amer 78 >59 mL/min/1.73   BUN/Creatinine Ratio 19 9 - 20   Sodium 141 134 - 144 mmol/L   Potassium 4.7 3.5 - 5.2 mmol/L   Chloride 102 96 - 106 mmol/L   CO2 26 20 - 29 mmol/L   Calcium 9.4 8.7 - 10.2 mg/dL   Total Protein 6.4 6.0 - 8.5 g/dL   Albumin 4.5 3.5 - 5.5 g/dL   Globulin, Total 1.9 1.5 - 4.5 g/dL   Albumin/Globulin Ratio 2.4 (H) 1.2 - 2.2   Bilirubin Total 1.0 0.0 - 1.2 mg/dL   Alkaline Phosphatase 88 39 - 117 IU/L   AST 23 0 - 40 IU/L   ALT 36 0 - 44 IU/L      Assessment & Plan:   Problem List Items Addressed This Visit      Endocrine   Hypogonadism in male    Rechecking levels today. Refills given. Call with any concerns.       Relevant Orders   CBC with Differential/Platelet   Comprehensive metabolic panel   PSA   Testosterone, free, total([facility])     Musculoskeletal and Integument   Primary osteoarthritis of both knees    Pain stable on current dose of tramadol. 3 month supply given. Call with any concerns. Recheck 6 months.       Relevant Orders   CBC with Differential/Platelet   Comprehensive metabolic panel     Genitourinary   Benign hypertensive renal disease    Under good control. Continue current regimen. Continue to monitor. Call with any concerns. Refills given.       Relevant Orders   CBC with Differential/Platelet   Comprehensive metabolic panel   Lipid Panel w/o Chol/HDL Ratio   TSH   UA/M w/rflx Culture, Routine     Other   Class 3 severe obesity due to excess calories with serious comorbidity and body mass index (BMI) of 40.0 to 44.9 in adult [facility])    Encouraged slow, steady weight loss of 1-2lbs a week. Call with any concerns.       Controlled substance agreement  signed    For tramadol. See scanned document.        Other Visit Diagnoses    Routine general medical examination at a health care facility    -  Primary   Vaccines up to date. Screening labs checked today. Colonoscopy up to date. Continue diet and exercise. Call with any concerns.    Relevant Orders   CBC with Differential/Platelet   Comprehensive metabolic panel   Immunization due       Tdap given today.   Relevant Orders   Tdap vaccine greater than or equal to 7yo IM (Completed)       LABORATORY TESTING:  Health maintenance labs ordered today as discussed above.   The natural history of prostate cancer and ongoing controversy regarding screening and potential treatment outcomes of prostate cancer has been discussed with the patient. The meaning of a false positive PSA and a false negative PSA has been discussed. He indicates understanding of the limitations of this screening test and wishes to proceed with screening PSA testing.   IMMUNIZATIONS:   - Tdap: Tetanus vaccination status reviewed:  Tdap vaccination indicated and given today. - Influenza: Postponed to flu season  SCREENING: - Colonoscopy: Up to date  Discussed with patient purpose of the colonoscopy is to detect colon cancer at curable precancerous or early stages   PATIENT COUNSELING:    Sexuality: Discussed sexually transmitted diseases, partner selection, use of condoms, avoidance of unintended pregnancy  and contraceptive alternatives.   Advised to avoid cigarette smoking.  I discussed with the patient that most people either abstain from alcohol or drink within safe limits (<=14/week and <=4 drinks/occasion for males, <=7/weeks and <= 3 drinks/occasion for females) and that the risk for alcohol disorders and other health effects rises proportionally with the number of drinks per week and how often a drinker exceeds daily limits.  Discussed cessation/primary prevention of drug use and availability of treatment  for abuse.   Diet: Encouraged to adjust caloric intake to maintain  or achieve ideal body weight, to reduce intake of dietary saturated fat and total fat, to limit sodium intake by avoiding high sodium foods and not adding table salt, and to maintain adequate dietary potassium and calcium preferably from fresh fruits, vegetables, and low-fat dairy products.    stressed the importance of regular exercise  Injury prevention: Discussed safety belts, safety helmets, smoke detector, smoking near bedding or upholstery.   Dental health: Discussed importance of regular tooth brushing, flossing, and dental visits.   Follow up plan: NEXT PREVENTATIVE PHYSICAL DUE IN 1 YEAR. Return in about 6 months (around 09/27/2018) for follow up.

## 2018-03-28 NOTE — Assessment & Plan Note (Signed)
Encouraged slow, steady weight loss of 1-2lbs a week. Call with any concerns.

## 2018-03-28 NOTE — Patient Instructions (Addendum)
Health Maintenance, Male A healthy lifestyle and preventive care is important for your health and wellness. Ask your health care provider about what schedule of regular examinations is right for you. What should I know about weight and diet? Eat a Healthy Diet  Eat plenty of vegetables, fruits, whole grains, low-fat dairy products, and lean protein.  Do not eat a lot of foods high in solid fats, added sugars, or salt.  Maintain a Healthy Weight Regular exercise can help you achieve or maintain a healthy weight. You should:  Do at least 150 minutes of exercise each week. The exercise should increase your heart rate and make you sweat (moderate-intensity exercise).  Do strength-training exercises at least twice a week.  Watch Your Levels of Cholesterol and Blood Lipids  Have your blood tested for lipids and cholesterol every 5 years starting at 60 years of age. If you are at high risk for heart disease, you should start having your blood tested when you are 60 years old. You may need to have your cholesterol levels checked more often if: ? Your lipid or cholesterol levels are high. ? You are older than 60 years of age. ? You are at high risk for heart disease.  What should I know about cancer screening? Many types of cancers can be detected early and may often be prevented. Lung Cancer  You should be screened every year for lung cancer if: ? You are a current smoker who has smoked for at least 30 years. ? You are a former smoker who has quit within the past 15 years.  Talk to your health care provider about your screening options, when you should start screening, and how often you should be screened.  Colorectal Cancer  Routine colorectal cancer screening usually begins at 60 years of age and should be repeated every 5-10 years until you are 60 years old. You may need to be screened more often if early forms of precancerous polyps or small growths are found. Your health care provider  may recommend screening at an earlier age if you have risk factors for colon cancer.  Your health care provider may recommend using home test kits to check for hidden blood in the stool.  A small camera at the end of a tube can be used to examine your colon (sigmoidoscopy or colonoscopy). This checks for the earliest forms of colorectal cancer.  Prostate and Testicular Cancer  Depending on your age and overall health, your health care provider may do certain tests to screen for prostate and testicular cancer.  Talk to your health care provider about any symptoms or concerns you have about testicular or prostate cancer.  Skin Cancer  Check your skin from head to toe regularly.  Tell your health care provider about any new moles or changes in moles, especially if: ? There is a change in a mole's size, shape, or color. ? You have a mole that is larger than a pencil eraser.  Always use sunscreen. Apply sunscreen liberally and repeat throughout the day.  Protect yourself by wearing long sleeves, pants, a wide-brimmed hat, and sunglasses when outside.  What should I know about heart disease, diabetes, and high blood pressure?  If you are 89-13 years of age, have your blood pressure checked every 3-5 years. If you are 70 years of age or older, have your blood pressure checked every year. You should have your blood pressure measured twice-once when you are at a hospital or clinic, and once when  you are not at a hospital or clinic. Record the average of the two measurements. To check your blood pressure when you are not at a hospital or clinic, you can use: ? An automated blood pressure machine at a pharmacy. ? A home blood pressure monitor.  Talk to your health care provider about your target blood pressure.  If you are between 3-74 years old, ask your health care provider if you should take aspirin to prevent heart disease.  Have regular diabetes screenings by checking your fasting blood  sugar level. ? If you are at a normal weight and have a low risk for diabetes, have this test once every three years after the age of 18. ? If you are overweight and have a high risk for diabetes, consider being tested at a younger age or more often.  A one-time screening for abdominal aortic aneurysm (AAA) by ultrasound is recommended for men aged 13-75 years who are current or former smokers. What should I know about preventing infection? Hepatitis B If you have a higher risk for hepatitis B, you should be screened for this virus. Talk with your health care provider to find out if you are at risk for hepatitis B infection. Hepatitis C Blood testing is recommended for:  Everyone born from [birth year] through 1963.  Anyone with known risk factors for hepatitis C.  Sexually Transmitted Diseases (STDs)  You should be screened each year for STDs including gonorrhea and chlamydia if: ? You are sexually active and are younger than 60 years of age. ? You are older than 60 years of age and your health care provider tells you that you are at risk for this type of infection. ? Your sexual activity has changed since you were last screened and you are at an increased risk for chlamydia or gonorrhea. Ask your health care provider if you are at risk.  Talk with your health care provider about whether you are at high risk of being infected with HIV. Your health care provider may recommend a prescription medicine to help prevent HIV infection.  What else can I do?  Schedule regular health, dental, and eye exams.  Stay current with your vaccines (immunizations).  Do not use any tobacco products, such as cigarettes, chewing tobacco, and e-cigarettes. If you need help quitting, ask your health care provider.  Limit alcohol intake to no more than 2 drinks per day. One drink equals 12 ounces of beer, 5 ounces of wine, or 1 ounces of hard liquor.  Do not use street drugs.  Do not share needles.  Ask your  health care provider for help if you need support or information about quitting drugs.  Tell your health care provider if you often feel depressed.  Tell your health care provider if you have ever been abused or do not feel safe at home. This information is not intended to replace advice given to you by your health care provider. Make sure you discuss any questions you have with your health care provider. Document Released: 03/15/2008 Document Revised: 05/16/2016 Document Reviewed: 06/21/2015 Elsevier Interactive Patient Education  2018 [employer]. Tdap Vaccine (Tetanus, Diphtheria and Pertussis): What You Need to Know 1. Why get vaccinated? Tetanus, diphtheria and pertussis are very serious diseases. Tdap vaccine can protect [place] from these diseases. And, Tdap vaccine given to pregnant women can protect newborn babies against pertussis. TETANUS (Lockjaw) is rare in the [place] today. It causes painful muscle tightening and stiffness, usually all over the body.  It can lead to tightening of muscles in the head and neck so you can't open your mouth, swallow, or sometimes even breathe. Tetanus kills about 1 out of 10 people who are infected even after receiving the best medical care.  DIPHTHERIA is also rare in the [place] today. It can cause a thick coating to form in the back of the throat.  It can lead to breathing problems, heart failure, paralysis, and death.  PERTUSSIS (Whooping Cough) causes severe coughing spells, which can cause difficulty breathing, vomiting and disturbed sleep.  It can also lead to weight loss, incontinence, and rib fractures. Up to 2 in 100 adolescents and 5 in 100 adults with pertussis are hospitalized or have complications, which could include pneumonia or death.  These diseases are caused by bacteria. Diphtheria and pertussis are spread from person to person through secretions from coughing or sneezing. Tetanus enters the body through cuts, scratches,  or wounds. Before vaccines, as many as 200,000 cases of diphtheria, 200,000 cases of pertussis, and hundreds of cases of tetanus, were reported in the [place] each year. Since vaccination began, reports of cases for tetanus and diphtheria have dropped by about 99% and for pertussis by about 80%. 2. Tdap vaccine Tdap vaccine can protect adolescents and adults from tetanus, diphtheria, and pertussis. One dose of Tdap is routinely given at age 35 or 13. People who did not get Tdap at that age should get it as soon as possible. Tdap is especially important for healthcare professionals and anyone having close contact with a baby younger than 12 months. Pregnant women should get a dose of Tdap during every pregnancy, to protect the newborn from pertussis. Infants are most at risk for severe, life-threatening complications from pertussis. Another vaccine, called Td, protects against tetanus and diphtheria, but not pertussis. A Td booster should be given every 10 years. Tdap may be given as one of these boosters if you have never gotten Tdap before. Tdap may also be given after a severe cut or burn to prevent tetanus infection. Your doctor or the person giving you the vaccine can give you more information. Tdap may safely be given at the same time as other vaccines. 3. Some people should not get this vaccine  A person who has ever had a life-threatening allergic reaction after a previous dose of any diphtheria, tetanus or pertussis containing vaccine, OR has a severe allergy to any part of this vaccine, should not get Tdap vaccine. Tell the person giving the vaccine about any severe allergies.  Anyone who had coma or long repeated seizures within 7 days after a childhood dose of DTP or DTaP, or a previous dose of Tdap, should not get Tdap, unless a cause other than the vaccine was found. They can still get Td.  Talk to your doctor if you: ? have seizures or another nervous system problem, ? had severe  pain or swelling after any vaccine containing diphtheria, tetanus or pertussis, ? ever had a condition called Guillain-Barr Syndrome (GBS), ? aren't feeling well on the day the shot is scheduled. 4. Risks With any medicine, including vaccines, there is a chance of side effects. These are usually mild and go away on their own. Serious reactions are also possible but are rare. Most people who get Tdap vaccine do not have any problems with it. Mild problems following Tdap: (Did not interfere with activities)  Pain where the shot was given (about 3 in 4 adolescents or 2 in 3 adults)  Redness or swelling where the shot was given (about 1 person in 5)  Mild fever of at least 100.45F (up to about 1 in 25 adolescents or 1 in 100 adults)  Headache (about 3 or 4 people in 10)  Tiredness (about 1 person in 3 or 4)  Nausea, vomiting, diarrhea, stomach ache (up to 1 in 4 adolescents or 1 in 10 adults)  Chills, sore joints (about 1 person in 10)  Body aches (about 1 person in 3 or 4)  Rash, swollen glands (uncommon)  Moderate problems following Tdap: (Interfered with activities, but did not require medical attention)  Pain where the shot was given (up to 1 in 5 or 6)  Redness or swelling where the shot was given (up to about 1 in 16 adolescents or 1 in 12 adults)  Fever over 102F (about 1 in 100 adolescents or 1 in 250 adults)  Headache (about 1 in 7 adolescents or 1 in 10 adults)  Nausea, vomiting, diarrhea, stomach ache (up to 1 or 3 people in 100)  Swelling of the entire arm where the shot was given (up to about 1 in 500).  Severe problems following Tdap: (Unable to perform usual activities; required medical attention)  Swelling, severe pain, bleeding and redness in the arm where the shot was given (rare).  Problems that could happen after any vaccine:  People sometimes faint after a medical procedure, including vaccination. Sitting or lying down for about 15 minutes can help  prevent fainting, and injuries caused by a fall. Tell your doctor if you feel dizzy, or have vision changes or ringing in the ears.  Some people get severe pain in the shoulder and have difficulty moving the arm where a shot was given. This happens very rarely.  Any medication can cause a severe allergic reaction. Such reactions from a vaccine are very rare, estimated at fewer than 1 in a million doses, and would happen within a few minutes to a few hours after the vaccination. As with any medicine, there is a very remote chance of a vaccine causing a serious injury or death. The safety of vaccines is always being monitored. For more information, visit: http://www.aguilar.org/ 5. What if there is a serious problem? What should I look for? Look for anything that concerns you, such as signs of a severe allergic reaction, very high fever, or unusual behavior. Signs of a severe allergic reaction can include hives, swelling of the face and throat, difficulty breathing, a fast heartbeat, dizziness, and weakness. These would usually start a few minutes to a few hours after the vaccination. What should I do?  If you think it is a severe allergic reaction or other emergency that can't wait, call 9-1-1 or get the person to the nearest hospital. Otherwise, call your doctor.  Afterward, the reaction should be reported to the Vaccine Adverse Event Reporting System (VAERS). Your doctor might file this report, or you can do it yourself through the VAERS web site at www.vaers.SamedayNews.es, or by calling 209 055 2344. ? VAERS does not give medical advice. 6. The National Vaccine Injury Compensation Program The [employer] Vaccine Injury Compensation Program (VICP) is a federal program that was created to compensate people who may have been injured by certain vaccines. Persons who believe they may have been injured by a vaccine can learn about the program and about filing a claim by calling 865-552-8675 or visiting  the [place] website at GoldCloset.com.ee. There is a time limit to file a claim for compensation. 7.  How can I learn more?  Ask your doctor. He or she can give you the vaccine package insert or suggest other sources of information.  Call your local or state health department.  Contact the Centers for Disease Control and Prevention (CDC): ? Call 773 455 9620 (1-800-CDC-INFO) or ? Visit CDC's website at http://hunter.com/ CDC Tdap Vaccine VIS (11/24/13) This information is not intended to replace advice given to you by your health care provider. Make sure you discuss any questions you have with your health care provider. Document Released: 03/18/2012 Document Revised: 06/07/2016 Document Reviewed: 06/07/2016 Elsevier Interactive Patient Education  2017 [place].  Achilles Tendinitis Achilles tendinitis is inflammation of the tough, cord-like band that attaches the lower leg muscles to the heel bone (Achilles tendon). This is usually caused by overusing the tendon and the ankle joint. Achilles tendinitis usually gets better over time with treatment and caring for yourself at home. It can take weeks or months to heal completely. What are the causes? This condition may be caused by:  A sudden increase in exercise or activity, such as running.  Doing the same exercises or activities (such as jumping) over and over.  Not warming up calf muscles before exercising.  Exercising in shoes that are worn out or not made for exercise.  Having arthritis or a bone growth (spur) on the back of the heel bone. This can rub against the tendon and hurt it.  Age-related wear and tear. Tendons become less flexible with age and more likely to be injured.  What are the signs or symptoms? Common symptoms of this condition include:  Pain in the Achilles tendon or in the back of the leg, just above the heel. The pain usually gets worse with exercise.  Stiffness or soreness in the back of  the leg, especially in the morning.  Swelling of the skin over the Achilles tendon.  Thickening of the tendon.  Bone spurs at the bottom of the Achilles tendon, near the heel.  Trouble standing on tiptoe.  How is this diagnosed? This condition is diagnosed based on your symptoms and a physical exam. You may have tests, including:  X-rays.  MRI.  How is this treated? The goal of treatment is to relieve symptoms and help your injury heal. Treatment may include:  Decreasing or stopping activities that caused the tendinitis. This may mean switching to low-impact exercises like biking or swimming.  Icing the injured area.  Doing physical therapy, including strengthening and stretching exercises.  NSAIDs to help relieve pain and swelling.  Using supportive shoes, wraps, heel lifts, or a walking boot (air cast).  Surgery. This may be done if your symptoms do not improve after 6 months.  Using high-energy shock wave impulses to stimulate the healing process (extracorporeal shock wave therapy). This is rare.  Injection of medicines to help relieve inflammation (corticosteroids). This is rare.  Follow these instructions at home: If you have an air cast:  Wear the cast as told by your health care provider. Remove it only as told by your health care provider.  Loosen the cast if your toes tingle, become numb, or turn cold and blue. Activity  Gradually return to your normal activities once your health care provider approves. Do not do activities that cause pain. ? Consider doing low-impact exercises, like cycling or swimming.  If you have an air cast, ask your health care provider when it is safe for you to drive.  If physical therapy was prescribed, do exercises as told  by your health care provider or physical therapist. Managing pain, stiffness, and swelling  Raise (elevate) your foot above the level of your heart while you are sitting or lying down.  Move your toes often  to avoid stiffness and to lessen swelling.  If directed, put ice on the injured area: ? Put ice in a plastic bag. ? Place a towel between your skin and the bag. ? Leave the ice on for 20 minutes, 2-3 times a day General instructions  If directed, wrap your foot with an elastic bandage or other wrap. This can help keep your tendon from moving too much while it heals. Your health care provider will show you how to wrap your foot correctly.  Wear supportive shoes or heel lifts only as told by your health care provider.  Take over-the-counter and prescription medicines only as told by your health care provider.  Keep all follow-up visits as told by your health care provider. This is important. Contact a health care provider if:  You have symptoms that gets worse.  You have pain that does not get better with medicine.  You develop new, unexplained symptoms.  You develop warmth and swelling in your foot.  You have a fever. Get help right away if:  You have a sudden popping sound or sensation in your Achilles tendon followed by severe pain.  You cannot move your toes or foot.  You cannot put any weight on your foot. Summary  Achilles tendinitis is inflammation of the tough, cord-like band that attaches the lower leg muscles to the heel bone (Achilles tendon).  This condition is usually caused by overusing the tendon and the ankle joint. It can also be caused by arthritis or normal aging.  The most common symptoms of this condition include pain, swelling, or stiffness in the Achilles tendon or in the back of the leg.  This condition is usually treated with rest, NSAIDs, and physical therapy. This information is not intended to replace advice given to you by your health care provider. Make sure you discuss any questions you have with your health care provider. Document Released: 06/27/2005 Document Revised: 08/06/2016 Document Reviewed: 08/06/2016 Elsevier Interactive Patient  Education  2017 [place]  Achilles Tendinitis Rehab Ask your health care provider which exercises are safe for you. Do exercises exactly as told by your health care provider and adjust them as directed. It is normal to feel mild stretching, pulling, tightness, or discomfort as you do these exercises, but you should stop right away if you feel sudden pain or your pain gets worse. Do not begin these exercises until told by your health care provider. Stretching and range of motion exercises These exercises warm up your muscles and joints and improve the movement and flexibility of your ankle. These exercises also help to relieve pain, numbness, and tingling. Exercise A: Standing wall calf stretch, knee straight  1. Stand with your hands against a wall. 2. Extend your __________ leg behind you and bend your front knee slightly. Keep both of your heels on the floor. 3. Point the toes of your back foot slightly inward. 4. Keeping your heels on the floor and your back knee straight, shift your weight toward the wall. Do not allow your back to arch. You should feel a gentle stretch in your calf. 5. Hold this position for seconds. Repeat __________ times. Complete this stretch __________ times per day. Exercise B: Standing wall calf stretch, knee bent 1. Stand with your hands against a  wall. 2. Extend your __________ leg behind you, and bend your front knee slightly. Keep both of your heels on the floor. 3. Point the toes of your back foot slightly inward. 4. Keeping your heels on the floor, unlock your back knee so that it is bent. You should feel a gentle stretch deep in your calf. 5. Hold this position for __________ seconds. Repeat __________ times. Complete this stretch __________ times per day. Strengthening exercises These exercises build strength and control of your ankle. Endurance is the ability to use your muscles for a long time, even after they get tired. Exercise C: Plantar flexion  with band  1. Sit on the floor with your __________ leg extended. You may put a pillow under your calf to give your foot more room to move. 2. Loop a rubber exercise band or tube around the ball of your __________ foot. The ball of your foot is on the walking surface, right under your toes. The band or tube should be slightly tense when your foot is relaxed. If the band or tube slips, you can put on your shoe or put a washcloth between the band and your foot to help it stay in place. 3. Slowly point your toes downward, pushing them away from you. 4. Hold this position for __________ seconds. 5. Slowly release the tension in the band or tube, controlling smoothly until your foot is back to the starting position. Repeat __________ times. Complete this exercise __________ times per day. Exercise D: Heel raise with eccentric lower  1. Stand on a step with the balls of your feet. The ball of your foot is on the walking surface, right under your toes. ? Do not put your heels on the step. ? For balance, rest your hands on the wall or on a railing. 2. Rise up onto the balls of your feet. 3. Keeping your heels up, shift all of your weight to your __________ leg and pick up your other leg. 4. Slowly lower your __________ leg so your heel drops below the level of the step. 5. Put down your foot. If told by your health care provider, build up to:  3 sets of 15 repetitions while keeping your knees straight.  3 sets of 15 repetitions while keeping your knees bent as far as told by your health care provider.  Complete this exercise __________ times per day. If this exercise is too easy, try doing it while wearing a backpack with weights in it. Balance exercises These exercises improve or maintain your balance. Balance is important in preventing falls. Exercise E: Single leg stand 1. Without shoes, stand near a railing or in a door frame. Hold on to the railing or door frame as needed. 2. Stand on your  __________ foot. Keep your big toe down on the floor and try to keep your arch lifted. 3. Hold this position for __________ seconds. Repeat __________ times. Complete this exercise __________ times per day. If this exercise is too easy, you can try it with your eyes closed or while standing on a pillow. This information is not intended to replace advice given to you by your health care provider. Make sure you discuss any questions you have with your health care provider. Document Released: 04/18/2005 Document Revised: 05/24/2016 Document Reviewed: 05/24/2015 Elsevier Interactive Patient Education  2018 [place].  NONALLERGIC RHINITIS OVERVIEW-Rhinitis refers to inflammation of the nasal passages. This inflammation can cause a variety of annoying symptoms, including sneezing, itching, nasal congestion, runny  nose, and postnasal drip (the sensation that mucus is draining from the sinuses down the back of the throat). Almost everyone experiences rhinitis at some point during their life. Brief episodes of rhinitis are usually caused by respiratory tract infections with viruses (eg, the common cold). Chronic rhinitis is usually caused by allergies, but it can also occur from overuse of certain drugs, some medical conditions, and other unidentifiable factors. This topic discusses nonallergic rhinitis. A separate topic discusses rhinitis caused by allergies. (See "Patient education: Allergic rhinitis (Beyond the Basics)".) WHAT IS NONALLERGIC RHINITIS?-"Nonallergic rhinitis" is the medical term used to describe the following symptoms when they occur without a known allergic cause for weeks to months at a time for at least one year: ?Sneezing ?Runny nose ?Stuffy nose (congestion) ?Postnasal drip Symptoms are usually present year-round, although they may be worsened by certain weather conditions (eg, those that accompany changes of season). The condition does not usually develop until  adulthood. NONALLERGIC RHINITIS TRIGGERS-The cause of nonallergic rhinitis is not known. However, many triggers of symptoms are known. These include irritants, such as tobacco smoke, traffic fumes, strong odors, and perfumes, as well as weather conditions (such as the arrival of a weather front). People with nonallergic rhinitis are not bothered by pollen or furred animals (the common triggers in allergic rhinitis), unless they also happen to have allergic rhinitis. About one-half of all patients with longstanding nasal symptoms have both allergic and nonallergic rhinitis. (See "Patient education: Allergic rhinitis (Beyond the Basics)".) NONALLERGIC RHINITIS TREATMENT-Treatment of nonallergic rhinitis includes trigger avoidance, medications, and/or nasal rinsing or irrigation. Trigger avoidance-Exposure to tobacco smoke can be reduced if household members stop smoking or smoke only outside of the home. It is also important to avoid smoke exposure in the workplace. Exposure to pollutants and irritants can be reduced by avoiding wood-burning stoves and fireplaces; properly venting other stoves and heaters; and avoiding cleaning agents and household sprays that trigger symptoms. Exposure to strong perfumes and scented products may be more difficult. People who are bothered by these items should avoid using them and may need to request that coworkers, family, or friends do the same. Some workplaces have policies regarding the use of strongly scented personal products. Nasal rinsing and irrigation-Simply rinsing the nose with a salt water (saline) solution one or more times per day is helpful for many patients with nonallergic rhinitis, as well as for other rhinitis conditions. Nasal rinsing is particularly useful for symptoms of postnasal drainage. Nasal rinsing can be done before use of nasal medication so that the lining is freshly cleansed when the medication is applied. The nose can be rinsed with  small amounts of saline by using over-the-counter saline nasal sprays or with larger amounts of saline. The latter technique is called nasal irrigation or nasal lavage (table 1). Nasal sprays are easy to use but do not rinse the nasal passages as thoroughly as nasal irrigation. However, nasal irrigation is less convenient and takes more time. A variety of devices, including syringes, Neti pots, and bottle sprayers, may be used to perform nasal irrigation. Your doctor or pharmacist can recommend a nasal irrigation kit. These are available without a prescription. Medications that worsen symptoms-Certain medications can cause or worsen nasal symptoms (especially congestion). These include birth control pills, some drugs for high blood pressure (eg, alpha-blockers and beta-blockers), antidepressants, medications for erectile dysfunction, and some medications for prostatic enlargement. If rhinitis symptoms are bothersome and one of these medications is used, ask the prescriber if the medication could  be aggravating the condition. Some patients with nonallergic rhinitis resort to using over-the-counter nasal sprays containing a nasal decongestant (eg, oxymetazoline or phenylephrine). Although these sprays can give rapid relief of congestion when used occasionally, the effects lessen if they are used regularly. Over time, many patients become tolerant to their effects. When this occurs, decongestant sprays actually worsen symptoms, causing the nose to swell unless the spray is used. In such instances it may be difficult to discontinue the spray, and to do so, a medical professional may be needed. Medications that may help symptoms-Daily use of a nasal glucocorticoid (steroid) and/or an antihistamine nasal spray can be helpful for people with nonallergic rhinitis. These medications may be used alone or in combination. Nasal antihistamines-A prescription nasal antihistamine spray, such as azelastine (eg, Astelin,  Astepro), can relieve symptoms of postnasal drip, congestion, and sneezing. These sprays start to work within minutes after use and can be used to treat symptoms after they develop. However, they are most effective when used on a regular basis. The most common side effect of nasal antihistamines is a bad taste in the mouth immediately after use. This can be minimized by keeping the head tilted forward while spraying to prevent the medicine from draining down the throat. The usual dose of azelastine is two sprays in each nostril twice per day. Nasal glucocorticoids (steroids)-Nasal glucocorticoids (steroids) have been shown to be effective for symptoms of nonallergic rhinitis. Some are available over-the-counter in the [place] (sample brand names: Flonase Allergy Relief, Rhinocort Allergy), while others require a prescription. Some symptom relief may occur on the first day of treatment, although the maximal effect may not be noticeable for days to weeks. For this reason, these agents are most effective when used regularly. Some people are able to use lower doses when symptoms are less severe. (See 'How to use a nasal spray' below.) Nasal ipratropium-A runny nose with profuse, watery discharge from the nose (rhinorrhea) can be treated with ipratropium (0.03 percent or 0.06 percent) nasal spray. Ipratropium is the best treatment for gustatory rhinitis. (See 'Gustatory rhinitis' below and 'Cold air rhinitis' below.) Combination nasal antihistamine and nasal glucocorticoid (steroid)-There is a nasal spray (Dymista) that contains both azelastine and fluticasone. Nasal capsaicin-Capsaicin is the active ingredient of chili peppers. It is available as an over-the-counter nasal spray (eg, Sinus Buster, Sinus Plumber, others). How to use a nasal spray-Nasal sprays work best when they are used properly and the medication remains in the nose, rather than draining down the back of the throat. Some people  find that holding one nostril closed with a finger improves their ability to draw the spray into the upper nose. Medicine that drains into the throat should be spit out, since the medicine is only effective when it remains in the nose. The head should be positioned normally or with the chin slightly tucked. The spray should be directed away from the nasal septum (the cartilage that divides the two sides of the nose). The spray is dispensed and then sniffed in slightly to pull it into the higher parts of the nose. Sniffing too hard will result in the medicine draining down the throat and should be avoided. Decongestants-Oral decongestant medications (like pseudoephedrine or phenylephrine) help to relieve symptoms of congestion (stuffiness) in some people. However, this treatment is not usually recommended unless nasal antihistamines and nasal glucocorticoids (steroids) do not improve symptoms. Several decongestant nasal sprays also are available (see 'Medications that worsen symptoms' above). Examples include oxymetazoline (Afrin) and phenylephrine (Neo-synephrine).  Nasal decongestants should not be used for more than two to three days at a time, because they may cause a type of rhinitis called rhinitis medicamentosa. (See 'Rhinitis medicamentosa' below.) Oral decongestants may increase blood pressure and thus may not be appropriate for people with high blood pressure or other cardiovascular conditions. In addition, oral decongestants can cause nervousness and difficulty sleeping. Men with an enlarged prostate who have difficulty urinating may notice a worsening of this problem when they take decongestants. (See "Patient education: Benign prostatic hyperplasia (BPH) (Beyond the Basics)".) How long will I need treatment?-The dose or frequency of medications can be reduced in some patients over time. However, in most patients, symptoms are lifelong, and some medication is usually needed on a daily and  long-term basis. OTHER TYPES OF NONALLERGIC RHINITIS Gustatory rhinitis-"Gustatory rhinitis" is the term used to describe the sudden onset of watery nasal discharge with eating, especially foods that are spicy or heated (such as soup). Cold air rhinitis-Cold air rhinitis, which is also called "Skier's nose," is the term applied to patients whose nose runs, producing a watery discharge in cold temperatures. Senile rhinitis-Senile rhinitis, which is also called "dry nose syndrome" or "atrophic rhinitis," occurs when the nasal glands that produce moisture fail to function adequately. This most often occurs with aging but can also be due to certain types of nasal surgery. Rhinitis medicamentosa-Rhinitis medicamentosa is a type of rhinitis that develops as a result of overuse of over-the-counter decongestant nasal sprays or from snorting cocaine (this does not happen with use of nasal glucocorticoid [steroid] sprays). Oral medications can also cause rhinitis medicamentosa. (See 'Medications that worsen symptoms' above.) Rhinitis medicamentosa is treated by discontinuing the drug that is causing the condition. Steroid nasal sprays can speed the recovery from this condition.

## 2018-03-28 NOTE — Assessment & Plan Note (Signed)
Pain stable on current dose of tramadol. 3 month supply given. Call with any concerns. Recheck 6 months.

## 2018-03-30 LAB — COMPREHENSIVE METABOLIC PANEL
A/G RATIO: 2.2 (ref 1.2–2.2)
ALT: 30 IU/L (ref 0–44)
AST: 18 IU/L (ref 0–40)
Albumin: 4.3 g/dL (ref 3.6–4.8)
Alkaline Phosphatase: 78 IU/L (ref 39–117)
BUN/Creatinine Ratio: 16 (ref 10–24)
BUN: 16 mg/dL (ref 8–27)
Bilirubin Total: 1.1 mg/dL (ref 0.0–1.2)
CALCIUM: 9.3 mg/dL (ref 8.6–10.2)
CO2: 23 mmol/L (ref 20–29)
Chloride: 101 mmol/L (ref 96–106)
Creatinine, Ser: 1 mg/dL (ref 0.76–1.27)
GFR, EST AFRICAN AMERICAN: 94 mL/min/{1.73_m2} (ref >59)
GFR, EST NON AFRICAN AMERICAN: 81 mL/min/{1.73_m2} (ref >59)
GLUCOSE: 112 mg/dL — AB (ref 65–99)
Globulin, Total: 2 g/dL (ref 1.5–4.5)
Potassium: 4.9 mmol/L (ref 3.5–5.2)
Sodium: 140 mmol/L (ref 134–144)
TOTAL PROTEIN: 6.3 g/dL (ref 6.0–8.5)

## 2018-03-30 LAB — CBC WITH DIFFERENTIAL/PLATELET
Basophils Absolute: 0 10*3/uL (ref 0.0–0.2)
Basos: 0 %
EOS (ABSOLUTE): 0.2 10*3/uL (ref 0.0–0.4)
EOS: 3 %
Hematocrit: 52.5 % — ABNORMAL HIGH (ref 37.5–51.0)
Hemoglobin: 18.3 g/dL — ABNORMAL HIGH (ref 13.0–17.7)
IMMATURE GRANS (ABS): 0 10*3/uL (ref 0.0–0.1)
IMMATURE GRANULOCYTES: 1 %
LYMPHS: 19 %
Lymphocytes Absolute: 1.3 10*3/uL (ref 0.7–3.1)
MCH: 34.1 pg — ABNORMAL HIGH (ref 26.6–33.0)
MCHC: 34.9 g/dL (ref 31.5–35.7)
MCV: 98 fL — AB (ref 79–97)
Monocytes Absolute: 0.5 10*3/uL (ref 0.1–0.9)
Monocytes: 7 %
NEUTROS PCT: 70 %
Neutrophils Absolute: 4.6 10*3/uL (ref 1.4–7.0)
PLATELETS: 143 10*3/uL — AB (ref 150–450)
RBC: 5.37 x10E6/uL (ref 4.14–5.80)
RDW: 15.5 % — ABNORMAL HIGH (ref 12.3–15.4)
WBC: 6.6 10*3/uL (ref 3.4–10.8)

## 2018-03-30 LAB — LIPID PANEL W/O CHOL/HDL RATIO
Cholesterol, Total: 190 mg/dL (ref 100–199)
HDL: 33 mg/dL — ABNORMAL LOW (ref >39)
LDL CALC: 130 mg/dL — AB (ref 0–99)
Triglycerides: 136 mg/dL (ref 0–149)
VLDL CHOLESTEROL CAL: 27 mg/dL (ref 5–40)

## 2018-03-30 LAB — TSH: TSH: 2.5 u[IU]/mL (ref 0.450–4.500)

## 2018-03-30 LAB — TESTOSTERONE, FREE, TOTAL, SHBG
Sex Hormone Binding: 33.7 nmol/L (ref 19.3–76.4)
Testosterone, Free: 16.1 pg/mL (ref 6.6–18.1)
Testosterone: 652 ng/dL (ref 264–916)

## 2018-03-30 LAB — PSA: Prostate Specific Ag, Serum: 1 ng/mL (ref 0.0–4.0)

## 2018-03-31 ENCOUNTER — Encounter: Payer: Self-pay | Admitting: Family Medicine

## 2018-04-15 ENCOUNTER — Telehealth: Payer: Self-pay

## 2018-04-15 NOTE — Telephone Encounter (Signed)
P.A. Was initiated via [email or web address] for testosterone 200 mg/ML.   Dorina Hoyer Key: [id number] - PA Case ID: [id number]

## 2018-06-05 ENCOUNTER — Other Ambulatory Visit: Payer: Self-pay | Admitting: Family Medicine

## 2018-06-05 NOTE — Telephone Encounter (Signed)
Called pharmacy and spoke with pharmacy tech. Stated that pt have picked up 60 pills (Tramadol) and there is still 30 more pills to be picked up.

## 2018-06-05 NOTE — Telephone Encounter (Signed)
Please make sure he got his refill on that ( I gave him 90 pills with 1 refill on 03/28/18)- if he did, he shouldn't be due until the end of the month, if he didn't let me know and I'll send it in for him.

## 2018-06-05 NOTE — Telephone Encounter (Signed)
Please let him know that he has some at the pharmacy that he can pick up. I'm refusing his refill- not due until the end of the month.

## 2018-06-05 NOTE — Telephone Encounter (Signed)
Refill of Tramadol  LRF 03/19/18  #90  1 refill  LOV 03/28/18 Dr[id number] - [place], [place] - [address]

## 2018-06-06 MED ORDER — TRAMADOL HCL 50 MG PO TABS: ORAL_TABLET | ORAL | 1 refills | Status: DC

## 2018-06-06 NOTE — Telephone Encounter (Signed)
Spoke with patient. He stated that this has been an ongoing issue because the sig is never right. Patient stated that he is taking 2 tablets in the morning and one at night. He states this is the only way its helpful. He stated that he has discussed that he is not taking it as prescribed with you. Patient states this is why he always runs out early. Please advise.

## 2018-06-06 NOTE — Addendum Note (Signed)
Addended by: Valerie Roys on: 06/06/2018 05:12 PM   Modules accepted: Orders

## 2018-06-06 NOTE — Telephone Encounter (Signed)
He was given 3 pills a day with refills- the pharmacy has medication waiting for him to pick up. Per notes below- he has 30 pills waiting at the pharmacy for him.

## 2018-06-06 NOTE — Telephone Encounter (Signed)
Pharmacy called- there is NOT 30 pills waiting for him. Refill sent to his pharmacy. Please let him know that his Rx is at his pharmacy.

## 2018-06-06 NOTE — Telephone Encounter (Addendum)
Pt calling back because he does not know how this Rx got changed to 2 a day. Pt states he takes 3 a day.  The last Rx was June 90 w/ one refill. That is only a 30 day. Pt is out and would like either a 30 day or a 90 day sent to pharmacy. traMADol (ULTRAM) 50 MG tablet  [id number] [address], [place] AT [address] [phone] (Phone) [phone] (Fax)

## 2018-06-06 NOTE — Telephone Encounter (Signed)
Please check with pharmacy. He should have had 180 pills from June until now. If he is out- I'll get him refills. Per last note, he's only picked up 5, and has another 30 at the pharmacy.Marland KitchenMarland Kitchen

## 2018-06-06 NOTE — Telephone Encounter (Signed)
Routing to provider. According to historical meds, patient's tramadol stated 2 tablets in the morning and 1 in the evening

## 2018-06-12 ENCOUNTER — Telehealth: Payer: Self-pay | Admitting: Family Medicine

## 2018-06-12 MED ORDER — TESTOSTERONE 50 MG/5GM (1%) TD GEL: 5.0000 g | Freq: Every day | TRANSDERMAL | 5 refills | Status: DC

## 2018-06-12 NOTE — Telephone Encounter (Signed)
Insurance denied his testosterone. Will send in gel. Call with any concerns.

## 2018-06-18 ENCOUNTER — Telehealth: Payer: Self-pay

## 2018-06-18 NOTE — Telephone Encounter (Signed)
Prior authorization for testosterone gel  was initiated via [email or web address]. Key: [id number]

## 2018-06-24 ENCOUNTER — Ambulatory Visit: Payer: Self-pay

## 2018-06-24 NOTE — Telephone Encounter (Signed)
Incoming call from  Patient, who complains of dizziness.  That statred on Friday.  Patient states that he literally jumped across the bed.  That's when it began.  States it occurs when he moves his head backwards and forwards.  Denies feeling faint or weak upon standing.  Denies vertigo.  Rates it mild.  Denies any other symptoms.  Unable to assess heart rate.  .States this is the first occurrence.  Denies fever, chest pain, vomiting, diarrhea and bleeding.  Appointment scheduled for 06/27/18 with Merrie Roof, PA-C Patient voiced understanding.  Reviewed care advice with Patient.  Voiced understanding.    Reason for Disposition . [1] MILD dizziness (e.g., walking normally) AND [2] has NOT been evaluated by physician for this  (Exception: dizziness caused by heat exposure, sudden standing, or poor fluid intake)  Answer Assessment - Initial Assessment Questions 1. DESCRIPTION: "Describe your dizziness."     *No Answer* 2. LIGHTHEADED: "Do you feel lightheaded?" (e.g., somewhat faint, woozy, weak upon standing)     *No Answer* 3. VERTIGO: "Do you feel like either you or the room is spinning or tilting?" (i.e. vertigo)     *No Answer* 4. SEVERITY: "How bad is it?"  "Do you feel like you are going to faint?" "Can you stand and walk?"   - MILD - walking normally   - MODERATE - interferes with normal activities (e.g., work, school)    - SEVERE - unable to stand, requires support to walk, feels like passing out now.      *No Answer* 5. ONSET:  "When did the dizziness begin?"     *No Answer* 6. AGGRAVATING FACTORS: "Does anything make it worse?" (e.g., standing, change in head position)     *No Answer* 7. HEART RATE: "Can you tell me your heart rate?" "How many beats in 15 seconds?"  (Note: not all patients can do this)       *No Answer* 8. CAUSE: "What do you think is causing the dizziness?"     *No Answer* 9. RECURRENT SYMPTOM: "Have you had dizziness before?" If so, ask: "When was the last time?"  "What happened that time?"     First time 10. OTHER SYMPTOMS: "Do you have any other symptoms?" (e.g., fever, chest pain, vomiting, diarrhea, bleeding)       no 11. PREGNANCY: "Is there any chance you are pregnant?" "When was your last menstrual period?"       *No Answer*  Protocols used: DIZZINESS Heidi Dach

## 2018-06-27 ENCOUNTER — Encounter: Payer: Self-pay | Admitting: Family Medicine

## 2018-06-27 ENCOUNTER — Ambulatory Visit (INDEPENDENT_AMBULATORY_CARE_PROVIDER_SITE_OTHER): Payer: BLUE CROSS/BLUE SHIELD | Admitting: Family Medicine

## 2018-06-27 ENCOUNTER — Other Ambulatory Visit: Payer: Self-pay

## 2018-06-27 VITALS — BP 144/87 | HR 63 | Temp 98.0°F | Ht 68.0 in | Wt 287.0 lb

## 2018-06-27 DIAGNOSIS — R42 Dizziness and giddiness: Secondary | ICD-10-CM | POA: Diagnosis not present

## 2018-06-27 DIAGNOSIS — Z23 Encounter for immunization: Secondary | ICD-10-CM

## 2018-06-27 MED ORDER — MECLIZINE HCL 25 MG PO TABS: 25.0000 mg | ORAL_TABLET | Freq: Three times a day (TID) | ORAL | 1 refills | Status: DC | PRN

## 2018-06-27 NOTE — Progress Notes (Signed)
BP (!) 144/87   Pulse 63   Temp 98 F (36.7 C) (Oral)   Ht 5\' 8"  (1.727 m)   Wt 287 lb (130.2 kg)   SpO2 96%   BMI 43.64 kg/m    Subjective:    Patient ID: Andrew [name], male    DOB: 1956/06/21, 62 y.o.   MRN: [id number]  HPI: Andrew [name] is a 62 y.o. male  Chief Complaint  Patient presents with  . Dizziness    x 1 week   Nausea started 8 days ago, then the next day felt dizzy off and on and that has continued intermittently. Seems to be worse when moving his head around. Tried some dramamine and epley maneuver which helped temporarily. Seems to be when he looks upward for any period of time. Does not feel like the room is spinning in circles, but feels off kilter and like things are wobbling back and forth. Denies vomiting, CP, visual disturbances, SOB, diaphoresis, syncope.   Relevant past medical, surgical, family and social history reviewed and updated as indicated. Interim medical history since our last visit reviewed. Allergies and medications reviewed and updated.  Review of Systems  Per HPI unless specifically indicated above     Objective:    BP (!) 144/87   Pulse 63   Temp 98 F (36.7 C) (Oral)   Ht 5\' 8"  (1.727 m)   Wt 287 lb (130.2 kg)   SpO2 96%   BMI 43.64 kg/m   Wt Readings from Last 3 Encounters:  06/27/18 287 lb (130.2 kg)  03/28/18 279 lb 5 oz (126.7 kg)  12/16/17 283 lb (128.4 kg)    Physical Exam  Constitutional: He is oriented to person, place, and time. He appears well-developed and well-nourished. No distress.  HENT:  Head: Atraumatic.  Eyes: Pupils are equal, round, and reactive to light. Conjunctivae and EOM are normal.  No nystagmus b/l   Neck: Normal range of motion. Neck supple.  Cardiovascular: Normal rate, regular rhythm and normal heart sounds.  Pulmonary/Chest: Effort normal and breath sounds normal.  Musculoskeletal: Normal range of motion.  Neurological: He is alert and oriented to person, place, and time. No cranial  nerve deficit. Coordination normal.  Skin: Skin is warm and dry.  Psychiatric: He has a normal mood and affect. His behavior is normal.  Nursing note and vitals reviewed.   Results for orders placed or performed in visit on 03/28/18  CBC with Differential/Platelet  Result Value Ref Range   WBC 6.6 3.4 - 10.8 x10E3/uL   RBC 5.37 4.14 - 5.80 x10E6/uL   Hemoglobin 18.3 (H) 13.0 - 17.7 g/dL   Hematocrit 52.5 (H) 37.5 - 51.0 %   MCV 98 (H) 79 - 97 fL   MCH 34.1 (H) 26.6 - 33.0 pg   MCHC 34.9 31.5 - 35.7 g/dL   RDW 15.5 (H) 12.3 - 15.4 %   Platelets 143 (L) 150 - 450 x10E3/uL   Neutrophils 70 Not Estab. %   Lymphs 19 Not Estab. %   Monocytes 7 Not Estab. %   Eos 3 Not Estab. %   Basos 0 Not Estab. %   Neutrophils Absolute 4.6 1.4 - 7.0 x10E3/uL   Lymphocytes Absolute 1.3 0.7 - 3.1 x10E3/uL   Monocytes Absolute 0.5 0.1 - 0.9 x10E3/uL   EOS (ABSOLUTE) 0.2 0.0 - 0.4 x10E3/uL   Basophils Absolute 0.0 0.0 - 0.2 x10E3/uL   Immature Granulocytes 1 Not Estab. %   Immature Grans (Abs)  0.0 0.0 - 0.1 x10E3/uL  Comprehensive metabolic panel  Result Value Ref Range   Glucose 112 (H) 65 - 99 mg/dL   BUN 16 8 - 27 mg/dL   Creatinine, Ser 1.00 0.76 - 1.27 mg/dL   GFR calc non Af Amer 81 >59 mL/min/1.73   GFR calc Af Amer 94 >59 mL/min/1.73   BUN/Creatinine Ratio 16 10 - 24   Sodium 140 134 - 144 mmol/L   Potassium 4.9 3.5 - 5.2 mmol/L   Chloride 101 96 - 106 mmol/L   CO2 23 20 - 29 mmol/L   Calcium 9.3 8.6 - 10.2 mg/dL   Total Protein 6.3 6.0 - 8.5 g/dL   Albumin 4.3 3.6 - 4.8 g/dL   Globulin, Total 2.0 1.5 - 4.5 g/dL   Albumin/Globulin Ratio 2.2 1.2 - 2.2   Bilirubin Total 1.1 0.0 - 1.2 mg/dL   Alkaline Phosphatase 78 39 - 117 IU/L   AST 18 0 - 40 IU/L   ALT 30 0 - 44 IU/L  PSA  Result Value Ref Range   Prostate Specific Ag, Serum 1.0 0.0 - 4.0 ng/mL  Lipid Panel w/o Chol/HDL Ratio  Result Value Ref Range   Cholesterol, Total 190 100 - 199 mg/dL   Triglycerides 136 0 - 149 mg/dL     HDL 33 (L) >39 mg/dL   VLDL Cholesterol Cal 27 5 - 40 mg/dL   LDL Calculated 130 (H) 0 - 99 mg/dL  TSH  Result Value Ref Range   TSH 2.500 0.450 - 4.500 uIU/mL  UA/M w/rflx Culture, Routine  Result Value Ref Range   Specific Gravity, UA 1.015 1.005 - 1.030   pH, UA 6.0 5.0 - 7.5   Color, UA Yellow Yellow   Appearance Ur Clear Clear   Leukocytes, UA Negative Negative   Protein, UA Negative Negative/Trace   Glucose, UA Negative Negative   Ketones, UA Negative Negative   RBC, UA Negative Negative   Bilirubin, UA Negative Negative   Urobilinogen, Ur 0.2 0.2 - 1.0 mg/dL   Nitrite, UA Negative Negative  Testosterone, free, total([facility])  Result Value Ref Range   Testosterone 652 264 - 916 ng/dL   Testosterone, Free 16.1 6.6 - 18.1 pg/mL   Sex Hormone Binding 33.7 19.3 - 76.4 nmol/L      Assessment & Plan:   Problem List Items Addressed This Visit    None    Visit Diagnoses    Dizziness    -  Primary   Relevant Orders   CBC with Differential/Platelet   Basic Metabolic Panel (BMET)   Flu vaccine need       Relevant Orders   Flu Vaccine QUAD 36+ mos IM (Completed)     Declines EKG, orthostatics today. Will check basic labs for r/o and tx with meclizine and epley maneuvers for presumed vertigo. Strict return precautions reviewed if not improving or worsening.   Follow up plan: Return if symptoms worsen or fail to improve.

## 2018-06-27 NOTE — Patient Instructions (Signed)

## 2018-06-28 LAB — BASIC METABOLIC PANEL
BUN/Creatinine Ratio: 27 — ABNORMAL HIGH (ref 10–24)
BUN: 29 mg/dL — AB (ref 8–27)
CALCIUM: 9.5 mg/dL (ref 8.6–10.2)
CHLORIDE: 106 mmol/L (ref 96–106)
CO2: 25 mmol/L (ref 20–29)
Creatinine, Ser: 1.06 mg/dL (ref 0.76–1.27)
GFR calc non Af Amer: 76 mL/min/{1.73_m2} (ref >59)
GFR, EST AFRICAN AMERICAN: 88 mL/min/{1.73_m2} (ref >59)
GLUCOSE: 128 mg/dL — AB (ref 65–99)
Potassium: 4.8 mmol/L (ref 3.5–5.2)
Sodium: 144 mmol/L (ref 134–144)

## 2018-06-28 LAB — CBC WITH DIFFERENTIAL/PLATELET
BASOS: 1 %
Basophils Absolute: 0 10*3/uL (ref 0.0–0.2)
EOS (ABSOLUTE): 0.2 10*3/uL (ref 0.0–0.4)
Eos: 4 %
Hematocrit: 39.4 % (ref 37.5–51.0)
Hemoglobin: 13.5 g/dL (ref 13.0–17.7)
IMMATURE GRANS (ABS): 0 10*3/uL (ref 0.0–0.1)
IMMATURE GRANULOCYTES: 0 %
LYMPHS: 23 %
Lymphocytes Absolute: 1.1 10*3/uL (ref 0.7–3.1)
MCH: 31.9 pg (ref 26.6–33.0)
MCHC: 34.3 g/dL (ref 31.5–35.7)
MCV: 93 fL (ref 79–97)
Monocytes Absolute: 0.4 10*3/uL (ref 0.1–0.9)
Monocytes: 7 %
NEUTROS PCT: 65 %
Neutrophils Absolute: 3.3 10*3/uL (ref 1.4–7.0)
Platelets: 144 10*3/uL — ABNORMAL LOW (ref 150–450)
RBC: 4.23 x10E6/uL (ref 4.14–5.80)
RDW: 13.9 % (ref 12.3–15.4)
WBC: 5.1 10*3/uL (ref 3.4–10.8)

## 2018-07-17 ENCOUNTER — Other Ambulatory Visit: Payer: Self-pay | Admitting: Family Medicine

## 2018-07-18 NOTE — Telephone Encounter (Signed)
Requested Prescriptions  Pending Prescriptions Disp Refills  . gabapentin (NEURONTIN) 300 MG capsule [Pharmacy Med Name: GABAPENTIN 300MG  CAPSULE] 180 capsule 1    Sig: TAKE 1 CAPSULE BY MOUTH  TWICE DAILY     Neurology: Anticonvulsants - gabapentin Passed - 07/17/2018  9:26 PM      Passed - Valid encounter within last 12 months    Recent Outpatient Visits          3 weeks ago Dizziness   [facility] Volney American, [place]   3 months ago Routine general medical examination at a health care facility   [facility], [place] P, DO   7 months ago Abnormal x-ray of paranasal sinus   [facility] [place], Megan P, DO   10 months ago Hypogonadism in male   [place], [place], DO   11 months ago Benign hypertensive renal disease   [facility] [place], Megan P, DO           . hydrochlorothiazide (MICROZIDE) 12.5 MG capsule [Pharmacy Med Name: HYDROCHLOROTHIAZIDE  12.5MG   CAP] 90 capsule 1    Sig: TAKE 1 CAPSULE BY MOUTH  DAILY     Cardiovascular: Diuretics - Thiazide Failed - 07/17/2018  9:26 PM      Failed - Last BP in normal range    BP Readings from Last 1 Encounters:  06/27/18 (!) 144/87         Passed - Ca in normal range and within 360 days    Calcium  Date Value Ref Range Status  06/27/2018 9.5 8.6 - 10.2 mg/dL Final         Passed - Cr in normal range and within 360 days    Creatinine, Ser  Date Value Ref Range Status  06/27/2018 1.06 0.76 - 1.27 mg/dL Final         Passed - K in normal range and within 360 days    Potassium  Date Value Ref Range Status  06/27/2018 4.8 3.5 - 5.2 mmol/L Final         Passed - Na in normal range and within 360 days    Sodium  Date Value Ref Range Status  06/27/2018 144 134 - 144 mmol/L Final         Passed - Valid encounter within last 6 months    Recent Outpatient Visits          3 weeks ago Dizziness   [facility] [place], PA-C   3 months ago Routine general medical examination at a health care facility   [facility], [place] P, DO   7 months ago Abnormal x-ray of paranasal sinus   [facility] [place], Megan P, DO   10 months ago Hypogonadism in male   [place], [place], DO   11 months ago Benign hypertensive renal disease   [facility] Johnson, Megan P, DO           . diclofenac (VOLTAREN) 75 MG EC tablet [Pharmacy Med Name: DICLOFENAC SODIUM  75MG   TAB  ENTERIC COATED] 180 tablet 1    Sig: TAKE 1 TABLET BY MOUTH  TWICE A DAY     Analgesics:  NSAIDS Passed - 07/17/2018  9:26 PM      Passed - Cr in normal range and within 360 days    Creatinine, Ser  Date Value Ref Range Status  06/27/2018 1.06 0.76 -  1.27 mg/dL Final         Passed - HGB in normal range and within 360 days    Hemoglobin  Date Value Ref Range Status  06/27/2018 13.5 13.0 - 17.7 g/dL Final         Passed - Patient is not pregnant      Passed - Valid encounter within last 12 months    Recent Outpatient Visits          3 weeks ago Dizziness   [facility] Volney American, [place]   3 months ago Routine general medical examination at a health care facility   [facility], [place] P, DO   7 months ago Abnormal x-ray of paranasal sinus   [facility] [place], Megan P, DO   10 months ago Hypogonadism in male   [place], [place], DO   11 months ago Benign hypertensive renal disease   [facility] Johnson, Megan P, DO           . buPROPion (WELLBUTRIN SR) 150 MG 12 hr tablet [Pharmacy Med Name: BUPROPION  SR  150MG  TAB 12HR] 180 tablet 1    Sig: TAKE 1 TABLET BY MOUTH  TWICE A DAY     Psychiatry: Antidepressants - bupropion Failed - 07/17/2018  9:26 PM      Failed - Last BP in normal range    BP Readings from Last 1 Encounters:  06/27/18 (!) 144/87          Passed - Valid encounter within last 6 months    Recent Outpatient Visits          3 weeks ago Dizziness   [facility] Volney American, [place]   3 months ago Routine general medical examination at a health care facility   [facility], [place], DO   7 months ago Abnormal x-ray of paranasal sinus   [facility] [place], Megan P, DO   10 months ago Hypogonadism in male   [place], [place], DO   11 months ago Benign hypertensive renal disease   [facility] Johnson, Megan P, DO           . benazepril (LOTENSIN) 40 MG tablet [Pharmacy Med Name: BENAZEPRIL  40MG   TAB] 90 tablet 1    Sig: TAKE 1 TABLET BY MOUTH  DAILY     Cardiovascular:  ACE Inhibitors Failed - 07/17/2018  9:26 PM      Failed - Last BP in normal range    BP Readings from Last 1 Encounters:  06/27/18 (!) 144/87         Passed - Cr in normal range and within 180 days    Creatinine, Ser  Date Value Ref Range Status  06/27/2018 1.06 0.76 - 1.27 mg/dL Final         Passed - K in normal range and within 180 days    Potassium  Date Value Ref Range Status  06/27/2018 4.8 3.5 - 5.2 mmol/L Final         Passed - Patient is not pregnant      Passed - Valid encounter within last 6 months    Recent Outpatient Visits          3 weeks ago [place], [place], [place]   3 months ago Routine general medical examination at a health care facility   [facility] [place], Megan P, DO   7 months ago Abnormal x-ray of paranasal sinus   [facility] [place], Megan P, DO   10 months ago Hypogonadism in male   [place], [place], DO   11 months ago Benign hypertensive renal disease   [employer], Megan P, DO           . metoprolol succinate (TOPROL-XL) 100 MG 24 hr tablet [Pharmacy Med Name: METOPROLOL SUCC ER 100MG  TABLET] 90  tablet 1    Sig: TAKE 1 TABLET BY MOUTH  DAILY WITH OR IMMEDIATELY  FOLLOWING A MEAL     Cardiovascular:  Beta Blockers Failed - 07/17/2018  9:26 PM      Failed - Last BP in normal range    BP Readings from Last 1 Encounters:  06/27/18 (!) 144/87         Passed - Last Heart Rate in normal range    Pulse Readings from Last 1 Encounters:  06/27/18 63         Passed - Valid encounter within last 6 months    Recent Outpatient Visits          3 weeks ago Dizziness   [facility] Volney American, [place]   3 months ago Routine general medical examination at a health care facility   [facility], [place] P, DO   7 months ago Abnormal x-ray of paranasal sinus   [facility] [place], Megan P, DO   10 months ago Hypogonadism in male   [place], [place], DO   11 months ago Benign hypertensive renal disease   [facility] [place], [place], DO

## 2018-07-29 ENCOUNTER — Encounter

## 2018-07-29 ENCOUNTER — Other Ambulatory Visit: Payer: Self-pay | Admitting: Podiatry

## 2018-07-29 ENCOUNTER — Ambulatory Visit (INDEPENDENT_AMBULATORY_CARE_PROVIDER_SITE_OTHER): Payer: BLUE CROSS/BLUE SHIELD

## 2018-07-29 ENCOUNTER — Ambulatory Visit (INDEPENDENT_AMBULATORY_CARE_PROVIDER_SITE_OTHER): Payer: BLUE CROSS/BLUE SHIELD | Admitting: Podiatry

## 2018-07-29 DIAGNOSIS — M7662 Achilles tendinitis, left leg: Secondary | ICD-10-CM

## 2018-07-29 DIAGNOSIS — M779 Enthesopathy, unspecified: Secondary | ICD-10-CM

## 2018-07-29 MED ORDER — METHYLPREDNISOLONE 4 MG PO TBPK: ORAL_TABLET | ORAL | 0 refills | Status: DC

## 2018-07-30 NOTE — Progress Notes (Signed)
   HPI: 62 year old male presenting today with a chief complaint of pulling pain in the left heel and Achilles tendon that began 3-4 weeks ago. Walking increases the pain and makes it constant. He states the pain is worse first thing in the morning. He has been wearing a brace, using Voltaren gel, icing and stretching the area. Patient is here for further evaluation and treatment.   Past Medical History:  Diagnosis Date  . Chronic pain syndrome   . Depression   . Diverticulosis   . DVT (deep venous thrombosis) (HCC)    bilateral, was on anticoags for 44months  . Ear infection 08/2016   CLEARING UP-FINISHED ANTIBIOTIC AND PREDNISONE TAPER   . Family history of adverse reaction to anesthesia    BROTHER-N/V  . Hernia, umbilical   . History of kidney stones   . Hyperlipidemia   . Hypertension   . IFG (impaired fasting glucose)   . Knee pain, chronic   . Pes anserine bursitis       Physical Exam: General: The patient is alert and oriented x3 in no acute distress.  Dermatology: Skin is warm, dry and supple bilateral lower extremities. Negative for open lesions or macerations.  Vascular: Palpable pedal pulses bilaterally. No edema or erythema noted. Capillary refill within normal limits.  Neurological: Epicritic and protective threshold grossly intact bilaterally.   Musculoskeletal Exam: Pain on palpation noted to the posterior tubercle of the left calcaneus at the insertion of the Achilles tendon consistent with retrocalcaneal bursitis. Range of motion within normal limits. Muscle strength 5/5 in all muscle groups bilateral lower extremities.  Radiographic Exam:  Posterior calcaneal spur noted to the respective calcaneus on lateral view. No fracture or dislocation noted. Normal osseous mineralization noted.     Assessment: 1. Insertional Achilles tendinitis left 2. Retrocalcaneal bursitis   Plan of Care:  1. Patient was evaluated. Radiographs were reviewed today. 2. Injection  of 0.5 mL Celestone Soluspan injected into the retrocalcaneal bursa. Care was taken to avoid direct injection into the Achilles tendon. 3. Prescription for Medrol Dose Pak provided to patient.  4. Continue taking Diclofenac twice daily as directed by PCP.  5. Continue taking Tramadol as directed by PCP.  6. Return to clinic as needed.   Going to [employer].    Edrick Kins, DPM [facility]  Dr. Edrick Kins, [address]                                        [place], [place] [address]                Office [phone]  Fax [phone]

## 2018-08-20 ENCOUNTER — Other Ambulatory Visit: Payer: Self-pay | Admitting: Family Medicine

## 2018-08-25 ENCOUNTER — Other Ambulatory Visit: Payer: Self-pay | Admitting: Family Medicine

## 2018-08-26 NOTE — Telephone Encounter (Signed)
Requested medication (s) are due for refill today: yes  Requested medication (s) are on the active medication list: yes  Last refill:  07/16/18  Future visit scheduled: yes  Notes to clinic:  Not delegated    Requested Prescriptions  Pending Prescriptions Disp Refills   traMADol (ULTRAM) 50 MG tablet [Pharmacy Med Name: TRAMADOL 50MG  TABLETS] 90 tablet 0    Sig: TAKE 2 TABLETS BY MOUTH IN THE MORNING AND 1 TABLET IN THE EVENING     Not Delegated - Analgesics:  Opioid Agonists Failed - 08/25/2018  7:56 AM      Failed - This refill cannot be delegated      Failed - Urine Drug Screen completed in last 360 days.      Passed - Valid encounter within last 6 months    Recent Outpatient Visits          2 months ago Dizziness   [facility] Volney American, [place]   5 months ago Routine general medical examination at a health care facility   [facility], [place] P, DO   8 months ago Abnormal x-ray of paranasal sinus   [place], [place], DO   11 months ago Hypogonadism in male   [place], [place], DO   1 year ago Benign hypertensive renal disease   [facility] [place], [place], DO

## 2018-10-15 ENCOUNTER — Other Ambulatory Visit: Payer: Self-pay | Admitting: Family Medicine

## 2018-10-15 NOTE — Telephone Encounter (Signed)
Requested medication (s) are due for refill today -yes  Requested medication (s) are on the active medication list -yes  Future visit scheduled -no  Last refill: 08/27/18  Notes to clinic: Patient is requesting refill of non- delegated Rx- sent for provider review  Requested Prescriptions  Pending Prescriptions Disp Refills   traMADol (ULTRAM) 50 MG tablet [Pharmacy Med Name: TRAMADOL 50MG  TABLETS] 90 tablet     Sig: TAKE 2 TABLETS BY MOUTH IN THE MORNING AND 1 TABLET IN THE EVENING     Not Delegated - Analgesics:  Opioid Agonists Failed - 10/15/2018  3:07 PM      Failed - This refill cannot be delegated      Failed - Urine Drug Screen completed in last 360 days.      Failed - Valid encounter within last 6 months    Recent Outpatient Visits          3 months ago Dizziness   [facility] Volney American, [place]   6 months ago Routine general medical examination at a health care facility   [facility], [place], DO   10 months ago Abnormal x-ray of paranasal sinus   [facility] [place], [place], DO   1 year ago Hypogonadism in male   [place], [place], DO   1 year ago Benign hypertensive renal disease   [facility] Johnson, Megan P, DO              Requested Prescriptions  Pending Prescriptions Disp Refills   traMADol (ULTRAM) 50 MG tablet [Pharmacy Med Name: TRAMADOL 50MG  TABLETS] 90 tablet     Sig: TAKE 2 TABLETS BY MOUTH IN THE MORNING AND 1 TABLET IN THE EVENING     Not Delegated - Analgesics:  Opioid Agonists Failed - 10/15/2018  3:07 PM      Failed - This refill cannot be delegated      Failed - Urine Drug Screen completed in last 360 days.      Failed - Valid encounter within last 6 months    Recent Outpatient Visits          3 months ago Dizziness   [facility] [place] [facility] Volney American, [place]   6 months ago Routine general medical examination at a health care  facility   [facility], [place], DO   10 months ago Abnormal x-ray of paranasal sinus   [place], [place], DO   1 year ago Hypogonadism in male   [place], [place], DO   1 year ago Benign hypertensive renal disease   [facility] [place], [place], DO

## 2018-10-16 NOTE — Telephone Encounter (Signed)
Pt has scheduled for tomorrow morning.

## 2018-10-16 NOTE — Telephone Encounter (Signed)
Will refill tomorrow AM

## 2018-10-16 NOTE — Telephone Encounter (Signed)
Needs a follow up appointment.

## 2018-10-17 ENCOUNTER — Encounter: Payer: Self-pay | Admitting: Family Medicine

## 2018-10-17 ENCOUNTER — Ambulatory Visit (INDEPENDENT_AMBULATORY_CARE_PROVIDER_SITE_OTHER): Payer: BLUE CROSS/BLUE SHIELD | Admitting: Family Medicine

## 2018-10-17 VITALS — BP 143/87 | HR 58 | Temp 98.5°F | Ht 68.0 in | Wt 284.0 lb

## 2018-10-17 DIAGNOSIS — M170 Bilateral primary osteoarthritis of knee: Secondary | ICD-10-CM

## 2018-10-17 DIAGNOSIS — L918 Other hypertrophic disorders of the skin: Secondary | ICD-10-CM

## 2018-10-17 DIAGNOSIS — E291 Testicular hypofunction: Secondary | ICD-10-CM | POA: Diagnosis not present

## 2018-10-17 DIAGNOSIS — R5382 Chronic fatigue, unspecified: Secondary | ICD-10-CM | POA: Diagnosis not present

## 2018-10-17 DIAGNOSIS — I129 Hypertensive chronic kidney disease with stage 1 through stage 4 chronic kidney disease, or unspecified chronic kidney disease: Secondary | ICD-10-CM

## 2018-10-17 DIAGNOSIS — M7662 Achilles tendinitis, left leg: Secondary | ICD-10-CM

## 2018-10-17 DIAGNOSIS — Z6841 Body Mass Index (BMI) 40.0 and over, adult: Secondary | ICD-10-CM

## 2018-10-17 LAB — BAYER DCA HB A1C WAIVED: HB A1C (BAYER DCA - WAIVED): 5.8 % (ref <7.0)

## 2018-10-17 MED ORDER — OMEPRAZOLE 40 MG PO CPDR: 40.0000 mg | DELAYED_RELEASE_CAPSULE | Freq: Every day | ORAL | 1 refills | Status: DC

## 2018-10-17 MED ORDER — HYDROCHLOROTHIAZIDE 12.5 MG PO CAPS: 12.5000 mg | ORAL_CAPSULE | Freq: Every day | ORAL | 1 refills | Status: DC

## 2018-10-17 MED ORDER — DICLOFENAC SODIUM 75 MG PO TBEC: 75.0000 mg | DELAYED_RELEASE_TABLET | Freq: Two times a day (BID) | ORAL | 1 refills | Status: DC

## 2018-10-17 MED ORDER — BENAZEPRIL HCL 40 MG PO TABS: 40.0000 mg | ORAL_TABLET | Freq: Every day | ORAL | 1 refills | Status: DC

## 2018-10-17 MED ORDER — BUPROPION HCL ER (SR) 150 MG PO TB12: 150.0000 mg | ORAL_TABLET | Freq: Two times a day (BID) | ORAL | 1 refills | Status: DC

## 2018-10-17 MED ORDER — GABAPENTIN 300 MG PO CAPS: 300.0000 mg | ORAL_CAPSULE | Freq: Two times a day (BID) | ORAL | 1 refills | Status: DC

## 2018-10-17 MED ORDER — TESTOSTERONE CYPIONATE 200 MG/ML IM SOLN: 200.0000 mg | INTRAMUSCULAR | 5 refills | Status: DC

## 2018-10-17 MED ORDER — METOPROLOL SUCCINATE ER 100 MG PO TB24: ORAL_TABLET | ORAL | 1 refills | Status: DC

## 2018-10-17 MED ORDER — TRAMADOL HCL 50 MG PO TABS: ORAL_TABLET | ORAL | 2 refills | Status: DC

## 2018-10-17 NOTE — Progress Notes (Signed)
BP (!) 143/87   Pulse (!) 58   Temp 98.5 F (36.9 C) (Oral)   Ht 5\' 8"  (1.727 m)   Wt 284 lb (128.8 kg)   SpO2 97%   BMI 43.18 kg/m    Subjective:    Patient ID: Andrew [name], male    DOB: [birth year], 63 y.o.   MRN: [id number]  HPI: Andrew [name] is a 63 y.o. male  Chief Complaint  Patient presents with  . Medication Refill    tramadol  . Referral    podiatrist and dermatologist  skin care   CHRONIC PAIN  Present dose: 10 Morphine equivalents Pain control status: stable Duration: chronic Location: bilateral knees Quality: aching and sore Current Pain Level: mild Previous Pain Level: moderate Breakthrough pain: yes Benefit from narcotic medications: yes What Activities task can be accomplished with current medication? Able to do his ADLs and work Interested in Careers adviser off narcotics:no   Stool softners/OTC fiber: no  Previous pain specialty evaluation: no Non-narcotic analgesic meds: yes Narcotic contract: yes  Was sick last week with a head cold. Has been very fatigued since then. Had some burning and back pain during that time.   HYPERTENSION Hypertension status: stable  Satisfied with current treatment? yes Duration of hypertension: chronic BP monitoring frequency:  not checking BP medication side effects:  no Medication compliance: excellent compliance Previous BP meds: metoprolol, benazepril, hctz Aspirin: no Recurrent headaches: no Visual changes: no Palpitations: no Dyspnea: no Chest pain: no Lower extremity edema: no Dizzy/lightheaded: no  LOW TESTOSTERONE Duration: years Status: stable  Satisfied with current treatment:  no- wants to go back on the shots Previous testosterone therapies: androgel and injections Medication side effects:  no Medication compliance: excellent compliance Decreased libido: yes Fatigue: yes Depressed mood: yes Muscle weakness: yes Erectile dysfunction: yes   Relevant past medical, surgical, family  and social history reviewed and updated as indicated. Interim medical history since our last visit reviewed. Allergies and medications reviewed and updated.  Review of Systems  Constitutional: Negative.   Respiratory: Negative.   Cardiovascular: Negative.   Musculoskeletal: Positive for arthralgias. Negative for back pain, gait problem, joint swelling, myalgias, neck pain and neck stiffness.  Skin: Negative.   Neurological: Negative.   Psychiatric/Behavioral: Negative.     Per HPI unless specifically indicated above     Objective:    BP (!) 143/87   Pulse (!) 58   Temp 98.5 F (36.9 C) (Oral)   Ht 5\' 8"  (1.727 m)   Wt 284 lb (128.8 kg)   SpO2 97%   BMI 43.18 kg/m   Wt Readings from Last 3 Encounters:  10/17/18 284 lb (128.8 kg)  06/27/18 287 lb (130.2 kg)  03/28/18 279 lb 5 oz (126.7 kg)    Physical Exam Vitals signs and nursing note reviewed.  Constitutional:      General: He is not in acute distress.    Appearance: Normal appearance. He is not ill-appearing, toxic-appearing or diaphoretic.  HENT:     Head: Normocephalic and atraumatic.     Right Ear: External ear normal.     Left Ear: External ear normal.     Nose: Nose normal.     Mouth/Throat:     Mouth: Mucous membranes are moist.     Pharynx: Oropharynx is clear.  Eyes:     General: No scleral icterus.       Right eye: No discharge.        Left eye: No discharge.  Extraocular Movements: Extraocular movements intact.     Conjunctiva/sclera: Conjunctivae normal.     Pupils: Pupils are equal, round, and reactive to light.  Neck:     Musculoskeletal: Normal range of motion and neck supple.  Cardiovascular:     Rate and Rhythm: Normal rate and regular rhythm.     Pulses: Normal pulses.     Heart sounds: Normal heart sounds. No murmur. No friction rub. No gallop.   Pulmonary:     Effort: Pulmonary effort is normal. No respiratory distress.     Breath sounds: Normal breath sounds. No stridor. No wheezing,  rhonchi or rales.  Chest:     Chest wall: No tenderness.  Musculoskeletal: Normal range of motion.  Skin:    General: Skin is warm and dry.     Capillary Refill: Capillary refill takes less than 2 seconds.     Coloration: Skin is not jaundiced or pale.     Findings: No bruising, erythema, lesion or rash.  Neurological:     General: No focal deficit present.     Mental Status: He is alert and oriented to person, place, and time. Mental status is at baseline.  Psychiatric:        Mood and Affect: Mood normal.        Behavior: Behavior normal.        Thought Content: Thought content normal.        Judgment: Judgment normal.     Results for orders placed or performed in visit on 10/17/18  CBC with Differential/Platelet  Result Value Ref Range   WBC 5.9 3.4 - 10.8 x10E3/uL   RBC 4.81 4.14 - 5.80 x10E6/uL   Hemoglobin 15.5 13.0 - 17.7 g/dL   Hematocrit 45.2 37.5 - 51.0 %   MCV 94 79 - 97 fL   MCH 32.2 26.6 - 33.0 pg   MCHC 34.3 31.5 - 35.7 g/dL   RDW 13.0 11.6 - 15.4 %   Platelets 174 150 - 450 x10E3/uL   Neutrophils 63 Not Estab. %   Lymphs 24 Not Estab. %   Monocytes 8 Not Estab. %   Eos 4 Not Estab. %   Basos 1 Not Estab. %   Neutrophils Absolute 3.7 1.4 - 7.0 x10E3/uL   Lymphocytes Absolute 1.4 0.7 - 3.1 x10E3/uL   Monocytes Absolute 0.5 0.1 - 0.9 x10E3/uL   EOS (ABSOLUTE) 0.3 0.0 - 0.4 x10E3/uL   Basophils Absolute 0.0 0.0 - 0.2 x10E3/uL   Immature Granulocytes 0 Not Estab. %   Immature Grans (Abs) 0.0 0.0 - 0.1 x10E3/uL  Bayer DCA Hb A1c Waived  Result Value Ref Range   HB A1C (BAYER DCA - WAIVED) 5.8 <7.0 %  Comprehensive metabolic panel  Result Value Ref Range   Glucose 129 (H) 65 - 99 mg/dL   BUN 40 (H) 8 - 27 mg/dL   Creatinine, Ser 1.21 0.76 - 1.27 mg/dL   GFR calc non Af Amer 65 >59 mL/min/1.73   GFR calc Af Amer 75 >59 mL/min/1.73   BUN/Creatinine Ratio 33 (H) 10 - 24   Sodium 139 134 - 144 mmol/L   Potassium 4.6 3.5 - 5.2 mmol/L   Chloride 101 96 - 106  mmol/L   CO2 23 20 - 29 mmol/L   Calcium 9.8 8.6 - 10.2 mg/dL   Total Protein 6.7 6.0 - 8.5 g/dL   Albumin 4.5 3.6 - 4.8 g/dL   Globulin, Total 2.2 1.5 - 4.5 g/dL   Albumin/Globulin Ratio 2.0 1.2 -  2.2   Bilirubin Total 0.9 0.0 - 1.2 mg/dL   Alkaline Phosphatase 85 39 - 117 IU/L   AST 38 0 - 40 IU/L   ALT 52 (H) 0 - 44 IU/L  VITAMIN D 25 Hydroxy (Vit-D Deficiency, Fractures)  Result Value Ref Range   Vit D, 25-Hydroxy 28.0 (L) 30.0 - 100.0 ng/mL  TSH  Result Value Ref Range   TSH 1.430 0.450 - 4.500 uIU/mL  Testosterone, free, total([facility])  Result Value Ref Range   Testosterone 112 (L) 264 - 916 ng/dL   Testosterone, Free WILL FOLLOW    Sex Hormone Binding 25.0 19.3 - 76.4 nmol/L      Assessment & Plan:   Problem List Items Addressed This Visit      Endocrine   Hypogonadism in male    Does not like his androgel. Would like to see if we can get his injections approved again. Rx sent to his pharmacy.         Genitourinary   Benign hypertensive renal disease    Under good control on current regimen. Continue current regimen. Continue to monitor. Call with any concerns. Refills given. Labs checked today.         Other   Primary osteoarthritis of both knees - Primary    Stable on current regimen. Refills foe 3 months given. Can follow up in 6 months.       Class 3 severe obesity due to excess calories with serious comorbidity and body mass index (BMI) of 40.0 to 44.9 in adult [facility])    Encouraged weight loss through diet and exercise. Work on slow, steady weight loss of 1-2lbs a week. Call with any concerns.        Other Visit Diagnoses    Chronic fatigue       Checking labs today. Await results. Call with any concerns.    Relevant Orders   CBC with Differential/Platelet (Completed)   Bayer DCA Hb A1c Waived (Completed)   Comprehensive metabolic panel (Completed)   VITAMIN D 25 Hydroxy (Vit-D Deficiency, Fractures) (Completed)   TSH (Completed)    Testosterone, free, total([facility]) (Completed)   Skin tag       Would like to see dermatology. Referral generated today.   Relevant Orders   Ambulatory referral to Dermatology   Achilles tendinitis of left lower extremity       Would like to get back into see podiatry. Referral generated today.   Relevant Orders   Ambulatory referral to Podiatry       Follow up plan: Return in about 6 months (around 04/17/2019) for Physical.

## 2018-10-19 ENCOUNTER — Encounter: Payer: Self-pay | Admitting: Family Medicine

## 2018-10-19 LAB — CBC WITH DIFFERENTIAL/PLATELET
BASOS ABS: 0 10*3/uL (ref 0.0–0.2)
BASOS: 1 %
EOS (ABSOLUTE): 0.3 10*3/uL (ref 0.0–0.4)
Eos: 4 %
HEMOGLOBIN: 15.5 g/dL (ref 13.0–17.7)
Hematocrit: 45.2 % (ref 37.5–51.0)
IMMATURE GRANS (ABS): 0 10*3/uL (ref 0.0–0.1)
Immature Granulocytes: 0 %
LYMPHS: 24 %
Lymphocytes Absolute: 1.4 10*3/uL (ref 0.7–3.1)
MCH: 32.2 pg (ref 26.6–33.0)
MCHC: 34.3 g/dL (ref 31.5–35.7)
MCV: 94 fL (ref 79–97)
MONOCYTES: 8 %
Monocytes Absolute: 0.5 10*3/uL (ref 0.1–0.9)
NEUTROS ABS: 3.7 10*3/uL (ref 1.4–7.0)
Neutrophils: 63 %
Platelets: 174 10*3/uL (ref 150–450)
RBC: 4.81 x10E6/uL (ref 4.14–5.80)
RDW: 13 % (ref 11.6–15.4)
WBC: 5.9 10*3/uL (ref 3.4–10.8)

## 2018-10-19 LAB — COMPREHENSIVE METABOLIC PANEL
ALBUMIN: 4.5 g/dL (ref 3.6–4.8)
ALT: 52 IU/L — ABNORMAL HIGH (ref 0–44)
AST: 38 IU/L (ref 0–40)
Albumin/Globulin Ratio: 2 (ref 1.2–2.2)
Alkaline Phosphatase: 85 IU/L (ref 39–117)
BILIRUBIN TOTAL: 0.9 mg/dL (ref 0.0–1.2)
BUN/Creatinine Ratio: 33 — ABNORMAL HIGH (ref 10–24)
BUN: 40 mg/dL — ABNORMAL HIGH (ref 8–27)
CO2: 23 mmol/L (ref 20–29)
CREATININE: 1.21 mg/dL (ref 0.76–1.27)
Calcium: 9.8 mg/dL (ref 8.6–10.2)
Chloride: 101 mmol/L (ref 96–106)
GFR calc non Af Amer: 65 mL/min/{1.73_m2} (ref >59)
GFR, EST AFRICAN AMERICAN: 75 mL/min/{1.73_m2} (ref >59)
GLUCOSE: 129 mg/dL — AB (ref 65–99)
Globulin, Total: 2.2 g/dL (ref 1.5–4.5)
Potassium: 4.6 mmol/L (ref 3.5–5.2)
Sodium: 139 mmol/L (ref 134–144)
TOTAL PROTEIN: 6.7 g/dL (ref 6.0–8.5)

## 2018-10-19 LAB — TESTOSTERONE, FREE, TOTAL, SHBG
Sex Hormone Binding: 25 nmol/L (ref 19.3–76.4)
Testosterone, Free: 5.2 pg/mL — ABNORMAL LOW (ref 6.6–18.1)
Testosterone: 112 ng/dL — ABNORMAL LOW (ref 264–916)

## 2018-10-19 LAB — TSH: TSH: 1.43 u[IU]/mL (ref 0.450–4.500)

## 2018-10-19 LAB — VITAMIN D 25 HYDROXY (VIT D DEFICIENCY, FRACTURES): Vit D, 25-Hydroxy: 28 ng/mL — ABNORMAL LOW (ref 30.0–100.0)

## 2018-10-19 NOTE — Assessment & Plan Note (Signed)
Under good control on current regimen. Continue current regimen. Continue to monitor. Call with any concerns. Refills given. Labs checked today.  

## 2018-10-19 NOTE — Assessment & Plan Note (Signed)
Does not like his androgel. Would like to see if we can get his injections approved again. Rx sent to his pharmacy.

## 2018-10-19 NOTE — Assessment & Plan Note (Signed)
Encouraged weight loss through diet and exercise. Work on slow, steady weight loss of 1-2lbs a week. Call with any concerns.

## 2018-10-19 NOTE — Assessment & Plan Note (Signed)
Stable on current regimen. Refills foe 3 months given. Can follow up in 6 months.

## 2018-10-20 ENCOUNTER — Telehealth: Payer: Self-pay

## 2018-10-20 NOTE — Telephone Encounter (Signed)
PA submitted via cover my meds for testosterone 200mg /ml. Awaiting approval or denial.

## 2018-10-29 ENCOUNTER — Ambulatory Visit (INDEPENDENT_AMBULATORY_CARE_PROVIDER_SITE_OTHER): Payer: BLUE CROSS/BLUE SHIELD | Admitting: Orthotics

## 2018-10-29 DIAGNOSIS — M7662 Achilles tendinitis, left leg: Secondary | ICD-10-CM

## 2018-10-29 DIAGNOSIS — M7661 Achilles tendinitis, right leg: Secondary | ICD-10-CM

## 2018-10-29 NOTE — Progress Notes (Signed)
Patient presents with achilles tendonitis, possibly secondary to pes planus, R/F valgus deformity.  Plan on Richy fab CMFO with 4* kiirby skive b/l as well as 1/4" heel raise.

## 2018-11-19 ENCOUNTER — Ambulatory Visit (INDEPENDENT_AMBULATORY_CARE_PROVIDER_SITE_OTHER): Payer: BLUE CROSS/BLUE SHIELD | Admitting: Orthotics

## 2018-11-19 DIAGNOSIS — M170 Bilateral primary osteoarthritis of knee: Secondary | ICD-10-CM

## 2018-11-19 DIAGNOSIS — M7662 Achilles tendinitis, left leg: Secondary | ICD-10-CM

## 2018-11-19 NOTE — Progress Notes (Signed)
Patient came in today to pick up custom made foot orthotics.  The goals were accomplished and the patient reported no dissatisfaction with said orthotics.  Patient was advised of breakin period and how to report any issues. 

## 2019-02-16 ENCOUNTER — Other Ambulatory Visit: Payer: Self-pay

## 2019-02-16 ENCOUNTER — Encounter: Payer: Self-pay | Admitting: Family Medicine

## 2019-02-16 ENCOUNTER — Ambulatory Visit (INDEPENDENT_AMBULATORY_CARE_PROVIDER_SITE_OTHER): Payer: BLUE CROSS/BLUE SHIELD | Admitting: Family Medicine

## 2019-02-16 VITALS — BP 138/83 | HR 57 | Temp 97.9°F | Ht 67.0 in | Wt 293.0 lb

## 2019-02-16 DIAGNOSIS — M5441 Lumbago with sciatica, right side: Secondary | ICD-10-CM

## 2019-02-16 DIAGNOSIS — R0602 Shortness of breath: Secondary | ICD-10-CM

## 2019-02-16 DIAGNOSIS — G8929 Other chronic pain: Secondary | ICD-10-CM

## 2019-02-16 DIAGNOSIS — M25551 Pain in right hip: Secondary | ICD-10-CM

## 2019-02-16 DIAGNOSIS — M5442 Lumbago with sciatica, left side: Secondary | ICD-10-CM

## 2019-02-16 NOTE — Progress Notes (Signed)
BP 138/83   Pulse (!) 57   Temp 97.9 F (36.6 C) (Oral)   Ht 5\' 7"  (1.702 m)   Wt 293 lb (132.9 kg)   SpO2 96%   BMI 45.89 kg/m    Subjective:    Patient ID: Andrew [name], male    DOB: [birth year], 63 y.o.   MRN: [id number]  HPI: TAAHIR [name] is a 63 y.o. male  Chief Complaint  Patient presents with  . Hip Pain  . Back Pain    fatigue and SOB while walking x a few years ago   BACK PAIN- has been having pain with burning in his hips for the past couple of years, has been getting significantly worse Duration: years- worse in the last couple of weeks Mechanism of injury: no trauma Location: Right and low back Onset: gradual Severity: severe Quality: burning, fatigue Frequency: with walking a distance Radiation: lateral both legs and up his back Aggravating factors: walking Alleviating factors: not walking, stretching Status: worse Treatments attempted: stretching, gabapentin, tramadol   Relief with NSAIDs?: No NSAIDs Taken Nighttime pain:  no Paresthesias / decreased sensation:  no Bowel / bladder incontinence:  no Fevers:  no Dysuria / urinary frequency:  no  Relevant past medical, surgical, family and social history reviewed and updated as indicated. Interim medical history since our last visit reviewed. Allergies and medications reviewed and updated.  Review of Systems  Constitutional: Negative.   Respiratory: Positive for shortness of breath (with exertion). Negative for apnea, cough, choking, chest tightness, wheezing and stridor.   Cardiovascular: Negative.   Musculoskeletal: Positive for arthralgias, back pain, gait problem and myalgias. Negative for joint swelling, neck pain and neck stiffness.  Skin: Negative.   Neurological: Positive for weakness. Negative for dizziness, tremors, seizures, syncope, facial asymmetry, speech difficulty, light-headedness, numbness and headaches.  Psychiatric/Behavioral: Negative.     Per HPI unless specifically  indicated above     Objective:    BP 138/83   Pulse (!) 57   Temp 97.9 F (36.6 C) (Oral)   Ht 5\' 7"  (1.702 m)   Wt 293 lb (132.9 kg)   SpO2 96%   BMI 45.89 kg/m   Wt Readings from Last 3 Encounters:  02/16/19 293 lb (132.9 kg)  10/17/18 284 lb (128.8 kg)  06/27/18 287 lb (130.2 kg)    Physical Exam Vitals signs and nursing note reviewed.  Constitutional:      General: He is not in acute distress.    Appearance: Normal appearance. He is not ill-appearing, toxic-appearing or diaphoretic.  HENT:     Head: Normocephalic and atraumatic.     Right Ear: External ear normal.     Left Ear: External ear normal.     Nose: Nose normal.     Mouth/Throat:     Mouth: Mucous membranes are moist.     Pharynx: Oropharynx is clear.  Eyes:     General: No scleral icterus.       Right eye: No discharge.        Left eye: No discharge.     Extraocular Movements: Extraocular movements intact.     Conjunctiva/sclera: Conjunctivae normal.     Pupils: Pupils are equal, round, and reactive to light.  Neck:     Musculoskeletal: Normal range of motion and neck supple.  Cardiovascular:     Rate and Rhythm: Normal rate and regular rhythm.     Pulses: Normal pulses.     Heart sounds: Normal heart sounds.  No murmur. No friction rub. No gallop.   Pulmonary:     Effort: Pulmonary effort is normal. No respiratory distress.     Breath sounds: Normal breath sounds. No stridor. No wheezing, rhonchi or rales.  Chest:     Chest wall: No tenderness.  Musculoskeletal: Normal range of motion.  Skin:    General: Skin is warm and dry.     Capillary Refill: Capillary refill takes less than 2 seconds.     Coloration: Skin is not jaundiced or pale.     Findings: No bruising, erythema, lesion or rash.  Neurological:     General: No focal deficit present.     Mental Status: He is alert and oriented to person, place, and time. Mental status is at baseline.  Psychiatric:        Mood and Affect: Mood normal.         Behavior: Behavior normal.        Thought Content: Thought content normal.        Judgment: Judgment normal.   Back Exam:    Inspection:  Normal spinal curvature.  No deformity, ecchymosis, erythema, or lesions     Palpation:     Midline spinal tenderness: no      Paralumbar tenderness: yes Right     Parathoracic tenderness: no      Buttocks tenderness: yesRight     Range of Motion:      Flexion: Fingers to Knees     Extension:Decreased     Lateral bending:Decreased    Rotation:Decreased    Neuro Exam:Lower extremity DTRs normal & symmetric.  Strength and sensation intact.    Special Tests:      Straight leg raise:negative   Results for orders placed or performed in visit on 10/17/18  CBC with Differential/Platelet  Result Value Ref Range   WBC 5.9 3.4 - 10.8 x10E3/uL   RBC 4.81 4.14 - 5.80 x10E6/uL   Hemoglobin 15.5 13.0 - 17.7 g/dL   Hematocrit 45.2 37.5 - 51.0 %   MCV 94 79 - 97 fL   MCH 32.2 26.6 - 33.0 pg   MCHC 34.3 31.5 - 35.7 g/dL   RDW 13.0 11.6 - 15.4 %   Platelets 174 150 - 450 x10E3/uL   Neutrophils 63 Not Estab. %   Lymphs 24 Not Estab. %   Monocytes 8 Not Estab. %   Eos 4 Not Estab. %   Basos 1 Not Estab. %   Neutrophils Absolute 3.7 1.4 - 7.0 x10E3/uL   Lymphocytes Absolute 1.4 0.7 - 3.1 x10E3/uL   Monocytes Absolute 0.5 0.1 - 0.9 x10E3/uL   EOS (ABSOLUTE) 0.3 0.0 - 0.4 x10E3/uL   Basophils Absolute 0.0 0.0 - 0.2 x10E3/uL   Immature Granulocytes 0 Not Estab. %   Immature Grans (Abs) 0.0 0.0 - 0.1 x10E3/uL  Bayer DCA Hb A1c Waived  Result Value Ref Range   HB A1C (BAYER DCA - WAIVED) 5.8 <7.0 %  Comprehensive metabolic panel  Result Value Ref Range   Glucose 129 (H) 65 - 99 mg/dL   BUN 40 (H) 8 - 27 mg/dL   Creatinine, Ser 1.21 0.76 - 1.27 mg/dL   GFR calc non Af Amer 65 >59 mL/min/1.73   GFR calc Af Amer 75 >59 mL/min/1.73   BUN/Creatinine Ratio 33 (H) 10 - 24   Sodium 139 134 - 144 mmol/L   Potassium 4.6 3.5 - 5.2 mmol/L   Chloride 101  96 - 106 mmol/L   CO2 23  20 - 29 mmol/L   Calcium 9.8 8.6 - 10.2 mg/dL   Total Protein 6.7 6.0 - 8.5 g/dL   Albumin 4.5 3.6 - 4.8 g/dL   Globulin, Total 2.2 1.5 - 4.5 g/dL   Albumin/Globulin Ratio 2.0 1.2 - 2.2   Bilirubin Total 0.9 0.0 - 1.2 mg/dL   Alkaline Phosphatase 85 39 - 117 IU/L   AST 38 0 - 40 IU/L   ALT 52 (H) 0 - 44 IU/L  VITAMIN D 25 Hydroxy (Vit-D Deficiency, Fractures)  Result Value Ref Range   Vit D, 25-Hydroxy 28.0 (L) 30.0 - 100.0 ng/mL  TSH  Result Value Ref Range   TSH 1.430 0.450 - 4.500 uIU/mL  Testosterone, free, total([facility])  Result Value Ref Range   Testosterone 112 (L) 264 - 916 ng/dL   Testosterone, Free 5.2 (L) 6.6 - 18.1 pg/mL   Sex Hormone Binding 25.0 19.3 - 76.4 nmol/L      Assessment & Plan:   Problem List Items Addressed This Visit    None    Visit Diagnoses    Chronic right-sided low back pain with bilateral sciatica    -  Primary   Will check x-rays and await results. Would likely benefit from PT. Call with any concerns. Continue to monitor.    Relevant Orders   DG Lumbar Spine Complete   Right hip pain       Will obtain x-rays. Await results. Call with any concerns.    Relevant Orders   DG Hip Unilat W OR W/O Pelvis 2-3 Views Right   SOB (shortness of breath)       Likely deconditioning- offered referral to cardiology, which he declined at this time. Call with any concerns.        Follow up plan: Return in about 2 months (around 04/18/2019) for Physical.

## 2019-03-09 ENCOUNTER — Other Ambulatory Visit: Payer: Self-pay | Admitting: Family Medicine

## 2019-03-09 NOTE — Telephone Encounter (Signed)
Requested medication (s) are due for refill today: Yes  Requested medication (s) are on the active medication list: Yes  Last refill:  10/17/18  Future visit scheduled: Yes  Notes to clinic:  Unable to refill, cannot delegate     Requested Prescriptions  Pending Prescriptions Disp Refills   traMADol (ULTRAM) 50 MG tablet [Pharmacy Med Name: TRAMADOL 50MG  TABLETS] 90 tablet     Sig: TAKE 2 TABLETS BY MOUTH EVERY MORNING AND 1 TABLET EVERY EVENING     Not Delegated - Analgesics:  Opioid Agonists Failed - 03/09/2019  2:14 PM      Failed - This refill cannot be delegated      Failed - Urine Drug Screen completed in last 360 days.      Passed - Valid encounter within last 6 months    Recent Outpatient Visits          3 weeks ago Chronic right-sided low back pain with bilateral sciatica   [place] P, DO   4 months ago Primary osteoarthritis of both knees   [place], [place], DO   8 months ago Dizziness   [place], [place]   11 months ago Routine general medical examination at a health care facility   [facility], [place], DO   1 year ago Abnormal x-ray of paranasal sinus   [facility] Valerie Roys, DO      Future Appointments            In 1 month Wynetta Emery, Barb Merino, DO [employer], [facility]

## 2019-03-29 ENCOUNTER — Other Ambulatory Visit: Payer: Self-pay | Admitting: Family Medicine

## 2019-04-18 ENCOUNTER — Other Ambulatory Visit: Payer: Self-pay | Admitting: Family Medicine

## 2019-04-20 ENCOUNTER — Other Ambulatory Visit: Payer: Self-pay | Admitting: Family Medicine

## 2019-04-20 DIAGNOSIS — M5416 Radiculopathy, lumbar region: Secondary | ICD-10-CM | POA: Diagnosis not present

## 2019-04-20 DIAGNOSIS — M62830 Muscle spasm of back: Secondary | ICD-10-CM | POA: Diagnosis not present

## 2019-04-20 DIAGNOSIS — M5136 Other intervertebral disc degeneration, lumbar region: Secondary | ICD-10-CM | POA: Diagnosis not present

## 2019-04-20 DIAGNOSIS — M9903 Segmental and somatic dysfunction of lumbar region: Secondary | ICD-10-CM | POA: Diagnosis not present

## 2019-04-20 NOTE — Telephone Encounter (Signed)
Can you find out if he has enough to make it until his appointment 7/31?

## 2019-04-20 NOTE — Telephone Encounter (Signed)
Requested medication (s) are due for refill today -yes  Requested medication (s) are on the active medication list -yes  Future visit scheduled -yes  Last refill: 10/14/18  Notes to clinic: Patient is requesting a non protocol medication- sent for PCP review of request.  Requested Prescriptions  Pending Prescriptions Disp Refills   testosterone cypionate (DEPOTESTOSTERONE CYPIONATE) 200 MG/ML injection [Pharmacy Med Name: TESTOSTERONE CYP 200MG /ML INJ, 1ML] 10 mL     Sig: INJECT 1 ML( 200 MG TOTAL) IN THE MUSCLE EVERY 14 DAYS     Off-Protocol Failed - 04/20/2019  2:50 PM      Failed - Medication not assigned to a protocol, review manually.      Passed - Valid encounter within last 12 months    Recent Outpatient Visits          2 months ago Chronic right-sided low back pain with bilateral sciatica   [place], Megan P, DO   6 months ago Primary osteoarthritis of both knees   [place], [place], [place]   9 months ago [place], [place], [place]   1 year ago Routine general medical examination at a health care facility   [facility], [place], DO   1 year ago Abnormal x-ray of paranasal sinus   [facility] [place], Megan P, DO      Future Appointments            In 1 week [place], Megan P, DO [place], [facility]              Requested Prescriptions  Pending Prescriptions Disp Refills   testosterone cypionate (DEPOTESTOSTERONE CYPIONATE) 200 MG/ML injection [Pharmacy Med Name: TESTOSTERONE CYP 200MG /ML INJ, 1ML] 10 mL     Sig: INJECT 1 ML( 200 MG TOTAL) IN THE MUSCLE EVERY 14 DAYS     Off-Protocol Failed - 04/20/2019  2:50 PM      Failed - Medication not assigned to a protocol, review manually.      Passed - Valid encounter within last 12 months    Recent Outpatient Visits          2 months ago Chronic right-sided low back pain with bilateral  sciatica   [place], Megan P, DO   6 months ago Primary osteoarthritis of both knees   [place], [place], [place]   9 months ago Dizziness   [place], [place]   1 year ago Routine general medical examination at a health care facility   [facility], [place], [place]   1 year ago Abnormal x-ray of paranasal sinus   [facility] Valerie Roys, DO      Future Appointments            In 1 week Wynetta Emery, Barb Merino, DO [facility], [facility]

## 2019-04-20 NOTE — Telephone Encounter (Signed)
Patient does have enough to last, would like to get it sent in a little early so that clinical staff have enough time to get prior authorization done before he runs out. Patient also states he is not going to go for pelvic xray because they are booked out due to covid and he has started seeing a chiropractor

## 2019-04-22 DIAGNOSIS — M5416 Radiculopathy, lumbar region: Secondary | ICD-10-CM | POA: Diagnosis not present

## 2019-04-22 DIAGNOSIS — M62830 Muscle spasm of back: Secondary | ICD-10-CM | POA: Diagnosis not present

## 2019-04-22 DIAGNOSIS — M5136 Other intervertebral disc degeneration, lumbar region: Secondary | ICD-10-CM | POA: Diagnosis not present

## 2019-04-22 DIAGNOSIS — M9903 Segmental and somatic dysfunction of lumbar region: Secondary | ICD-10-CM | POA: Diagnosis not present

## 2019-04-23 DIAGNOSIS — M5416 Radiculopathy, lumbar region: Secondary | ICD-10-CM | POA: Diagnosis not present

## 2019-04-23 DIAGNOSIS — M5136 Other intervertebral disc degeneration, lumbar region: Secondary | ICD-10-CM | POA: Diagnosis not present

## 2019-04-23 DIAGNOSIS — M62830 Muscle spasm of back: Secondary | ICD-10-CM | POA: Diagnosis not present

## 2019-04-23 DIAGNOSIS — M9903 Segmental and somatic dysfunction of lumbar region: Secondary | ICD-10-CM | POA: Diagnosis not present

## 2019-04-25 DIAGNOSIS — M5416 Radiculopathy, lumbar region: Secondary | ICD-10-CM | POA: Diagnosis not present

## 2019-04-25 DIAGNOSIS — M9903 Segmental and somatic dysfunction of lumbar region: Secondary | ICD-10-CM | POA: Diagnosis not present

## 2019-04-25 DIAGNOSIS — M62830 Muscle spasm of back: Secondary | ICD-10-CM | POA: Diagnosis not present

## 2019-04-25 DIAGNOSIS — M5136 Other intervertebral disc degeneration, lumbar region: Secondary | ICD-10-CM | POA: Diagnosis not present

## 2019-04-29 DIAGNOSIS — M9903 Segmental and somatic dysfunction of lumbar region: Secondary | ICD-10-CM | POA: Diagnosis not present

## 2019-04-29 DIAGNOSIS — M5136 Other intervertebral disc degeneration, lumbar region: Secondary | ICD-10-CM | POA: Diagnosis not present

## 2019-04-29 DIAGNOSIS — M5416 Radiculopathy, lumbar region: Secondary | ICD-10-CM | POA: Diagnosis not present

## 2019-04-29 DIAGNOSIS — M62830 Muscle spasm of back: Secondary | ICD-10-CM | POA: Diagnosis not present

## 2019-05-01 ENCOUNTER — Ambulatory Visit (INDEPENDENT_AMBULATORY_CARE_PROVIDER_SITE_OTHER): Payer: BC Managed Care – PPO | Admitting: Family Medicine

## 2019-05-01 ENCOUNTER — Encounter: Payer: Self-pay | Admitting: Family Medicine

## 2019-05-01 ENCOUNTER — Other Ambulatory Visit: Payer: Self-pay

## 2019-05-01 VITALS — BP 122/78 | HR 65 | Temp 98.4°F

## 2019-05-01 DIAGNOSIS — Z0000 Encounter for general adult medical examination without abnormal findings: Secondary | ICD-10-CM

## 2019-05-01 DIAGNOSIS — M170 Bilateral primary osteoarthritis of knee: Secondary | ICD-10-CM | POA: Diagnosis not present

## 2019-05-01 DIAGNOSIS — I129 Hypertensive chronic kidney disease with stage 1 through stage 4 chronic kidney disease, or unspecified chronic kidney disease: Secondary | ICD-10-CM

## 2019-05-01 DIAGNOSIS — M5441 Lumbago with sciatica, right side: Secondary | ICD-10-CM | POA: Diagnosis not present

## 2019-05-01 DIAGNOSIS — G8929 Other chronic pain: Secondary | ICD-10-CM

## 2019-05-01 DIAGNOSIS — E291 Testicular hypofunction: Secondary | ICD-10-CM | POA: Diagnosis not present

## 2019-05-01 DIAGNOSIS — M5442 Lumbago with sciatica, left side: Secondary | ICD-10-CM

## 2019-05-01 LAB — UA/M W/RFLX CULTURE, ROUTINE
Bilirubin, UA: NEGATIVE
Glucose, UA: NEGATIVE
Ketones, UA: NEGATIVE
Leukocytes,UA: NEGATIVE
Nitrite, UA: NEGATIVE
Protein,UA: NEGATIVE
RBC, UA: NEGATIVE
Specific Gravity, UA: 1.02 (ref 1.005–1.030)
Urobilinogen, Ur: 0.2 mg/dL (ref 0.2–1.0)
pH, UA: 5.5 (ref 5.0–7.5)

## 2019-05-01 LAB — MICROALBUMIN, URINE WAIVED
Creatinine, Urine Waived: 100 mg/dL (ref 10–300)
Microalb, Ur Waived: 80 mg/L — ABNORMAL HIGH (ref 0–19)

## 2019-05-01 LAB — BAYER DCA HB A1C WAIVED: HB A1C (BAYER DCA - WAIVED): 6.6 % (ref <7.0)

## 2019-05-01 MED ORDER — GABAPENTIN 300 MG PO CAPS: 600.0000 mg | ORAL_CAPSULE | Freq: Two times a day (BID) | ORAL | 1 refills | Status: DC

## 2019-05-01 NOTE — Progress Notes (Signed)
BP 122/78   Pulse 65   Temp 98.4 F (36.9 C)   SpO2 97%    Subjective:    Patient ID: Andrew [name], male    DOB: 1956-06-02, 63 y.o.   MRN: [id number]  HPI: Andrew [name] is a 63 y.o. male presenting on 05/01/2019 for comprehensive medical examination. Current medical complaints include:  Has been seeing a chiropractor for his back and his hip. Has not gotten the x-rays. He feels like he's gotten a 5% improvement in his pain. Having difficulty climbing ladders. Having a lot of trouble moving around and at work- concerned about his ability to do his job. Would like to consider doing some short term disability. Feels like his pain has changed, but doesn't really seem like it's getting better.   HYPERTENSION Hypertension status: controlled  Satisfied with current treatment? yes Duration of hypertension: chronic BP monitoring frequency:  not checking BP medication side effects:  no Medication compliance: excellent compliance Previous BP meds:benazepril, metoprolol, HCTZ Aspirin: no Recurrent headaches: no Visual changes: no Palpitations: no Dyspnea: no Chest pain: no Lower extremity edema: no Dizzy/lightheaded: no  LOW TESTOSTERONE Duration: chronic Status: controlled  Satisfied with current treatment:  yes Medication side effects:  no Medication compliance: excellent compliance Decreased libido: no Fatigue: no Depressed mood: no Muscle weakness: no Erectile dysfunction: no  CHRONIC PAIN  Present dose: 15 Morphine equivalents Pain control status: exacerbated Duration: chronic Location: low back and bilateral knees Quality: aching Current Pain Level: moderate Previous Pain Level: moderate Breakthrough pain: yes Benefit from narcotic medications: yes What Activities task can be accomplished with current medication? Able to work Interested in Careers adviser off narcotics:no   Stool softners/OTC fiber: no  Previous pain specialty evaluation: no Non-narcotic analgesic  meds: yes Narcotic contract: yes  He currently lives with: wife Interim Problems from his last visit: no  Depression Screen done today and results listed below:  Depression screen [facility] 2/9 05/01/2019 03/28/2018 12/16/2017 02/22/2017 10/05/2015  Decreased Interest 1 0 0 0 0  Down, Depressed, Hopeless 1 0 0 0 0  PHQ - 2 Score 2 0 0 0 0  Altered sleeping 0 - 0 0 -  Tired, decreased energy 0 - 0 0 -  Change in appetite 0 - 0 0 -  Feeling bad or failure about yourself  0 - 0 0 -  Trouble concentrating 0 - 0 0 -  Moving slowly or fidgety/restless 0 - 0 0 -  Suicidal thoughts 0 - 0 0 -  PHQ-9 Score 2 - 0 0 -  Difficult doing work/chores Not difficult at all Not difficult at all - - -    Past Medical History:  Past Medical History:  Diagnosis Date  . Chronic pain syndrome   . Depression   . Diverticulosis   . DVT (deep venous thrombosis) (HCC)    bilateral, was on anticoags for 36months  . Ear infection 08/2016   CLEARING UP-FINISHED ANTIBIOTIC AND PREDNISONE TAPER   . Family history of adverse reaction to anesthesia    BROTHER-N/V  . Hernia, umbilical   . History of kidney stones   . Hyperlipidemia   . Hypertension   . IFG (impaired fasting glucose)   . Knee pain, chronic   . Pes anserine bursitis     Surgical History:  Past Surgical History:  Procedure Laterality Date  . COLONOSCOPY  2015  . JOINT REPLACEMENT Bilateral 2009   replacement and revision  . KIDNEY STONE SURGERY    .  UMBILICAL HERNIA REPAIR N/A 09/06/2016   Procedure: HERNIA REPAIR UMBILICAL ADULT;  Surgeon: Robert Bellow, MD;  Location: [facility] ORS;  Service: General;  Laterality: N/A;    Medications:  Current Outpatient Medications on File Prior to Visit  Medication Sig  . benazepril (LOTENSIN) 40 MG tablet Take 1 tablet (40 mg total) by mouth daily. Keep Office Visit  . buPROPion (WELLBUTRIN SR) 150 MG 12 hr tablet TAKE 1 TABLET BY MOUTH TWO  TIMES DAILY  . diclofenac (VOLTAREN) 75 MG EC tablet TAKE 1  TABLET BY MOUTH TWO  TIMES DAILY  . hydrochlorothiazide (MICROZIDE) 12.5 MG capsule Take 1 capsule (12.5 mg total) by mouth daily. KEEP OFFICE VISIT  . metoprolol succinate (TOPROL-XL) 100 MG 24 hr tablet TAKE 1 TABLET BY MOUTH  DAILY WITH OR IMMEDIATELY  FOLLOWING A MEAL  . omeprazole (PRILOSEC) 40 MG capsule TAKE 1 CAPSULE BY MOUTH  DAILY  . sildenafil (VIAGRA) 100 MG tablet TAKE 1/2 TO 1 TABLET BY  MOUTH DAILY AS NEEDED FOR  ERECTILE DYSFUNCTION  . testosterone cypionate (DEPOTESTOSTERONE CYPIONATE) 200 MG/ML injection INJECT 1 ML( 200 MG TOTAL) IN THE MUSCLE EVERY 14 DAYS  . traMADol (ULTRAM) 50 MG tablet TAKE 2 TABLETS BY MOUTH EVERY MORNING AND 1 TABLET EVERY EVENING   No current facility-administered medications on file prior to visit.     Allergies:  No Known Allergies  Social History:  Social History   Socioeconomic History  . Marital status: Married    Spouse name: Not on file  . Number of children: Not on file  . Years of education: Not on file  . Highest education level: Not on file  Occupational History  . Not on file  Social Needs  . Financial resource strain: Not on file  . Food insecurity    Worry: Not on file    Inability: Not on file  . Transportation needs    Medical: Not on file    Non-medical: Not on file  Tobacco Use  . Smoking status: Former Smoker    Packs/day: 1.50    Years: 25.00    Pack years: 37.50    Types: Cigarettes    Quit date: 06/28/2011    Years since quitting: 7.8  . Smokeless tobacco: Never Used  Substance and Sexual Activity  . Alcohol use: Yes    Comment: on occasion  . Drug use: No  . Sexual activity: Yes    Birth control/protection: None  Lifestyle  . Physical activity    Days per week: Not on file    Minutes per session: Not on file  . Stress: Not on file  Relationships  . Social Herbalist on phone: Not on file    Gets together: Not on file    Attends religious service: Not on file    Active member of club  or organization: Not on file    Attends meetings of clubs or organizations: Not on file    Relationship status: Not on file  . Intimate partner violence    Fear of current or ex partner: Not on file    Emotionally abused: Not on file    Physically abused: Not on file    Forced sexual activity: Not on file  Other Topics Concern  . Not on file  Social History Narrative  . Not on file   Social History   Tobacco Use  Smoking Status Former Smoker  . Packs/day: 1.50  . Years: 25.00  .  Pack years: 37.50  . Types: Cigarettes  . Quit date: 06/28/2011  . Years since quitting: 7.8  Smokeless Tobacco Never Used   Social History   Substance and Sexual Activity  Alcohol Use Yes   Comment: on occasion    Family History:  Family History  Problem Relation Age of Onset  . Alcohol abuse Father   . Hypertension Father   . Cirrhosis Father   . Hypertension Brother   . Stroke Paternal Grandmother   . Stroke Paternal Grandfather   . Pneumonia Mother     Past medical history, surgical history, medications, allergies, family history and social history reviewed with patient today and changes made to appropriate areas of the chart.   Review of Systems  Constitutional: Negative.   HENT: Positive for hearing loss. Negative for congestion, ear discharge, ear pain, nosebleeds, sinus pain, sore throat and tinnitus.   Eyes: Negative.   Respiratory: Negative for stridor.   Cardiovascular: Negative.   Gastrointestinal: Positive for abdominal pain. Negative for blood in stool, constipation, diarrhea, heartburn, melena, nausea and vomiting.  Genitourinary: Negative.   Musculoskeletal: Positive for back pain, joint pain and myalgias. Negative for falls and neck pain.  Skin: Negative.   Neurological: Negative.   Endo/Heme/Allergies: Negative for environmental allergies and polydipsia. Bruises/bleeds easily.  Psychiatric/Behavioral: Negative.     All other ROS negative except what is listed above  and in the HPI.      Objective:    BP 122/78   Pulse 65   Temp 98.4 F (36.9 C)   SpO2 97%   Wt Readings from Last 3 Encounters:  02/16/19 293 lb (132.9 kg)  10/17/18 284 lb (128.8 kg)  06/27/18 287 lb (130.2 kg)    Physical Exam Vitals signs and nursing note reviewed.  Constitutional:      General: He is not in acute distress.    Appearance: Normal appearance. He is obese. He is not ill-appearing, toxic-appearing or diaphoretic.  HENT:     Head: Normocephalic and atraumatic.     Right Ear: Tympanic membrane, ear canal and external ear normal. There is no impacted cerumen.     Left Ear: Tympanic membrane, ear canal and external ear normal. There is no impacted cerumen.     Nose: Nose normal. No congestion or rhinorrhea.     Mouth/Throat:     Mouth: Mucous membranes are moist.     Pharynx: Oropharynx is clear. No oropharyngeal exudate or posterior oropharyngeal erythema.  Eyes:     General: No scleral icterus.       Right eye: No discharge.        Left eye: No discharge.     Extraocular Movements: Extraocular movements intact.     Conjunctiva/sclera: Conjunctivae normal.     Pupils: Pupils are equal, round, and reactive to light.  Neck:     Musculoskeletal: Normal range of motion and neck supple. No neck rigidity or muscular tenderness.     Vascular: No carotid bruit.  Cardiovascular:     Rate and Rhythm: Normal rate and regular rhythm.     Pulses: Normal pulses.     Heart sounds: No murmur. No friction rub. No gallop.   Pulmonary:     Effort: Pulmonary effort is normal. No respiratory distress.     Breath sounds: Normal breath sounds. No stridor. No wheezing, rhonchi or rales.  Chest:     Chest wall: No tenderness.  Abdominal:     General: Abdomen is flat. Bowel sounds are  normal. There is no distension.     Palpations: Abdomen is soft. There is no mass.     Tenderness: There is no abdominal tenderness. There is no right CVA tenderness, left CVA tenderness, guarding  or rebound.     Hernia: No hernia is present.  Genitourinary:    Comments: Genital exam deferred with shared decision making Musculoskeletal:        General: No swelling, tenderness, deformity or signs of injury.     Right lower leg: No edema.     Left lower leg: No edema.  Lymphadenopathy:     Cervical: No cervical adenopathy.  Skin:    General: Skin is warm and dry.     Capillary Refill: Capillary refill takes less than 2 seconds.     Coloration: Skin is not jaundiced or pale.     Findings: No bruising, erythema, lesion or rash.  Neurological:     General: No focal deficit present.     Mental Status: He is alert and oriented to person, place, and time.     Cranial Nerves: No cranial nerve deficit.     Sensory: No sensory deficit.     Motor: No weakness.     Coordination: Coordination normal.     Gait: Gait normal.     Deep Tendon Reflexes: Reflexes normal.  Psychiatric:        Mood and Affect: Mood normal.        Behavior: Behavior normal.        Thought Content: Thought content normal.        Judgment: Judgment normal.     Results for orders placed or performed in visit on 05/01/19  HM COLONOSCOPY  Result Value Ref Range   HM Colonoscopy Patient Reported See Report (in chart), Patient Reported      Assessment & Plan:   Problem List Items Addressed This Visit      Endocrine   Hypogonadism in male    Rechecking labs today. Await results- treat as needed.       Relevant Orders   PSA   Testosterone, free, total([facility])     Genitourinary   Benign hypertensive renal disease    Under good control on current regimen. Continue current regimen. Continue to monitor. Call with any concerns. Refills given. Labs drawn today.       Relevant Orders   Comprehensive metabolic panel   Microalbumin, Urine Waived   TSH     Other   Primary osteoarthritis of both knees    Acting up. Will get him into see orthopedics. Call with any concerns. Continue to monitor.  Recheck 3 months. Tramadol not due for refill. Call with any concerns.       Relevant Orders   Ambulatory referral to Orthopedic Surgery    Other Visit Diagnoses    Routine general medical examination at a health care facility    -  Primary   Vaccines up to date. Screening labs checked today. Cotninue diet and exercise- checking on status of Colonoscopy. Call wiht any concerns.    Relevant Orders   Bayer DCA Hb A1c Waived   CBC with Differential/Platelet   Comprehensive metabolic panel   Lipid Panel w/o Chol/HDL Ratio   Microalbumin, Urine Waived   TSH   UA/M w/rflx Culture, Routine   Chronic left-sided low back pain with bilateral sciatica       Not doing well. Will get him into see ortho. Call with any concerns. Continue to monitor.  Relevant Medications   gabapentin (NEURONTIN) 300 MG capsule   Other Relevant Orders   Ambulatory referral to Orthopedic Surgery       Discussed aspirin prophylaxis for myocardial infarction prevention and decision was made to continue ASA  LABORATORY TESTING:  Health maintenance labs ordered today as discussed above.   The natural history of prostate cancer and ongoing controversy regarding screening and potential treatment outcomes of prostate cancer has been discussed with the patient. The meaning of a false positive PSA and a false negative PSA has been discussed. He indicates understanding of the limitations of this screening test and wishes to proceed with screening PSA testing.   IMMUNIZATIONS:   - Tdap: Tetanus vaccination status reviewed: last tetanus booster within 10 years. - Influenza: Postponed to flu season - Pneumovax: Not applicable  SCREENING: - Colonoscopy: Ordered today  Discussed with patient purpose of the colonoscopy is to detect colon cancer at curable precancerous or early stages   PATIENT COUNSELING:    Sexuality: Discussed sexually transmitted diseases, partner selection, use of condoms, avoidance of unintended  pregnancy  and contraceptive alternatives.   Advised to avoid cigarette smoking.  I discussed with the patient that most people either abstain from alcohol or drink within safe limits (<=14/week and <=4 drinks/occasion for males, <=7/weeks and <= 3 drinks/occasion for females) and that the risk for alcohol disorders and other health effects rises proportionally with the number of drinks per week and how often a drinker exceeds daily limits.  Discussed cessation/primary prevention of drug use and availability of treatment for abuse.   Diet: Encouraged to adjust caloric intake to maintain  or achieve ideal body weight, to reduce intake of dietary saturated fat and total fat, to limit sodium intake by avoiding high sodium foods and not adding table salt, and to maintain adequate dietary potassium and calcium preferably from fresh fruits, vegetables, and low-fat dairy products.    stressed the importance of regular exercise  Injury prevention: Discussed safety belts, safety helmets, smoke detector, smoking near bedding or upholstery.   Dental health: Discussed importance of regular tooth brushing, flossing, and dental visits.   Follow up plan: NEXT PREVENTATIVE PHYSICAL DUE IN 1 YEAR. Return in about 3 months (around 08/01/2019).

## 2019-05-01 NOTE — Assessment & Plan Note (Signed)
Acting up. Will get him into see orthopedics. Call with any concerns. Continue to monitor. Recheck 3 months. Tramadol not due for refill. Call with any concerns.

## 2019-05-01 NOTE — Assessment & Plan Note (Signed)
Rechecking labs today. Await results treat as needed.  °

## 2019-05-01 NOTE — Assessment & Plan Note (Signed)
Under good control on current regimen. Continue current regimen. Continue to monitor. Call with any concerns. Refills given. Labs drawn today.   

## 2019-05-04 DIAGNOSIS — M5416 Radiculopathy, lumbar region: Secondary | ICD-10-CM | POA: Diagnosis not present

## 2019-05-04 DIAGNOSIS — M9903 Segmental and somatic dysfunction of lumbar region: Secondary | ICD-10-CM | POA: Diagnosis not present

## 2019-05-04 DIAGNOSIS — M62830 Muscle spasm of back: Secondary | ICD-10-CM | POA: Diagnosis not present

## 2019-05-04 DIAGNOSIS — M5136 Other intervertebral disc degeneration, lumbar region: Secondary | ICD-10-CM | POA: Diagnosis not present

## 2019-05-05 LAB — CBC WITH DIFFERENTIAL/PLATELET
Basophils Absolute: 0.1 10*3/uL (ref 0.0–0.2)
Basos: 1 %
EOS (ABSOLUTE): 0.3 10*3/uL (ref 0.0–0.4)
Eos: 4 %
Hematocrit: 49.2 % (ref 37.5–51.0)
Hemoglobin: 16.7 g/dL (ref 13.0–17.7)
Immature Grans (Abs): 0 10*3/uL (ref 0.0–0.1)
Immature Granulocytes: 1 %
Lymphocytes Absolute: 1.5 10*3/uL (ref 0.7–3.1)
Lymphs: 18 %
MCH: 31.6 pg (ref 26.6–33.0)
MCHC: 33.9 g/dL (ref 31.5–35.7)
MCV: 93 fL (ref 79–97)
Monocytes Absolute: 0.7 10*3/uL (ref 0.1–0.9)
Monocytes: 9 %
Neutrophils Absolute: 5.3 10*3/uL (ref 1.4–7.0)
Neutrophils: 67 %
Platelets: 182 10*3/uL (ref 150–450)
RBC: 5.29 x10E6/uL (ref 4.14–5.80)
RDW: 13.9 % (ref 11.6–15.4)
WBC: 7.9 10*3/uL (ref 3.4–10.8)

## 2019-05-05 LAB — COMPREHENSIVE METABOLIC PANEL
ALT: 47 IU/L — ABNORMAL HIGH (ref 0–44)
AST: 31 IU/L (ref 0–40)
Albumin/Globulin Ratio: 2.3 — ABNORMAL HIGH (ref 1.2–2.2)
Albumin: 4.5 g/dL (ref 3.8–4.8)
Alkaline Phosphatase: 92 IU/L (ref 39–117)
BUN/Creatinine Ratio: 19 (ref 10–24)
BUN: 19 mg/dL (ref 8–27)
Bilirubin Total: 0.8 mg/dL (ref 0.0–1.2)
CO2: 26 mmol/L (ref 20–29)
Calcium: 9.6 mg/dL (ref 8.6–10.2)
Chloride: 97 mmol/L (ref 96–106)
Creatinine, Ser: 1 mg/dL (ref 0.76–1.27)
GFR calc Af Amer: 93 mL/min/{1.73_m2} (ref >59)
GFR calc non Af Amer: 81 mL/min/{1.73_m2} (ref >59)
Globulin, Total: 2 g/dL (ref 1.5–4.5)
Glucose: 191 mg/dL — ABNORMAL HIGH (ref 65–99)
Potassium: 4.9 mmol/L (ref 3.5–5.2)
Sodium: 137 mmol/L (ref 134–144)
Total Protein: 6.5 g/dL (ref 6.0–8.5)

## 2019-05-05 LAB — TESTOSTERONE, FREE, TOTAL, SHBG
Sex Hormone Binding: 26.2 nmol/L (ref 19.3–76.4)
Testosterone, Free: 21.6 pg/mL — ABNORMAL HIGH (ref 6.6–18.1)
Testosterone: 939 ng/dL — ABNORMAL HIGH (ref 264–916)

## 2019-05-05 LAB — LIPID PANEL W/O CHOL/HDL RATIO
Cholesterol, Total: 188 mg/dL (ref 100–199)
HDL: 30 mg/dL — ABNORMAL LOW (ref >39)
LDL Calculated: 119 mg/dL — ABNORMAL HIGH (ref 0–99)
Triglycerides: 196 mg/dL — ABNORMAL HIGH (ref 0–149)
VLDL Cholesterol Cal: 39 mg/dL (ref 5–40)

## 2019-05-05 LAB — PSA: Prostate Specific Ag, Serum: 1 ng/mL (ref 0.0–4.0)

## 2019-05-05 LAB — TSH: TSH: 2.22 u[IU]/mL (ref 0.450–4.500)

## 2019-05-06 DIAGNOSIS — M62830 Muscle spasm of back: Secondary | ICD-10-CM | POA: Diagnosis not present

## 2019-05-06 DIAGNOSIS — M9903 Segmental and somatic dysfunction of lumbar region: Secondary | ICD-10-CM | POA: Diagnosis not present

## 2019-05-06 DIAGNOSIS — M5416 Radiculopathy, lumbar region: Secondary | ICD-10-CM | POA: Diagnosis not present

## 2019-05-06 DIAGNOSIS — M5136 Other intervertebral disc degeneration, lumbar region: Secondary | ICD-10-CM | POA: Diagnosis not present

## 2019-05-08 DIAGNOSIS — M5136 Other intervertebral disc degeneration, lumbar region: Secondary | ICD-10-CM | POA: Diagnosis not present

## 2019-05-08 DIAGNOSIS — M62830 Muscle spasm of back: Secondary | ICD-10-CM | POA: Diagnosis not present

## 2019-05-08 DIAGNOSIS — M5416 Radiculopathy, lumbar region: Secondary | ICD-10-CM | POA: Diagnosis not present

## 2019-05-08 DIAGNOSIS — M9903 Segmental and somatic dysfunction of lumbar region: Secondary | ICD-10-CM | POA: Diagnosis not present

## 2019-05-11 DIAGNOSIS — M5416 Radiculopathy, lumbar region: Secondary | ICD-10-CM | POA: Diagnosis not present

## 2019-05-11 DIAGNOSIS — M5136 Other intervertebral disc degeneration, lumbar region: Secondary | ICD-10-CM | POA: Diagnosis not present

## 2019-05-11 DIAGNOSIS — M9903 Segmental and somatic dysfunction of lumbar region: Secondary | ICD-10-CM | POA: Diagnosis not present

## 2019-05-11 DIAGNOSIS — M62830 Muscle spasm of back: Secondary | ICD-10-CM | POA: Diagnosis not present

## 2019-05-18 DIAGNOSIS — M5416 Radiculopathy, lumbar region: Secondary | ICD-10-CM | POA: Diagnosis not present

## 2019-05-18 DIAGNOSIS — M9903 Segmental and somatic dysfunction of lumbar region: Secondary | ICD-10-CM | POA: Diagnosis not present

## 2019-05-18 DIAGNOSIS — M5136 Other intervertebral disc degeneration, lumbar region: Secondary | ICD-10-CM | POA: Diagnosis not present

## 2019-05-18 DIAGNOSIS — M62830 Muscle spasm of back: Secondary | ICD-10-CM | POA: Diagnosis not present

## 2019-05-23 ENCOUNTER — Other Ambulatory Visit: Payer: Self-pay | Admitting: Family Medicine

## 2019-05-23 NOTE — Telephone Encounter (Signed)
Requested medication (s) are due for refill today   YES  Requested medication (s) are on the active medication list   YES  Future visit scheduled   YES  LOV  02/16/19   Last refill 04/20/19         Requesting a non-delegated medication.   Requested Prescriptions  Pending Prescriptions Disp Refills   testosterone cypionate (DEPOTESTOSTERONE CYPIONATE) 200 MG/ML injection [Pharmacy Med Name: TESTOSTERONE CYP 200MG /ML INJ, 1ML] 2 mL     Sig: ADMINISTER 1 ML(200 MG) IN THE MUSCLE EVERY 14 DAYS     Off-Protocol Failed - 05/23/2019 10:12 AM      Failed - Medication not assigned to a protocol, review manually.      Passed - Valid encounter within last 12 months    Recent Outpatient Visits          3 weeks ago Routine general medical examination at a health care facility   [facility], [place] P, DO   3 months ago Chronic right-sided low back pain with bilateral sciatica   [place] P, DO   7 months ago Primary osteoarthritis of both knees   [place], [place], [place]   11 months ago Dizziness   [place], [place]   1 year ago Routine general medical examination at a health care facility   [facility], Barb Merino, DO      Future Appointments            In 5 months Wynetta Emery, Barb Merino, DO [employer], [place]

## 2019-05-26 NOTE — Telephone Encounter (Signed)
Does this patient need to be on testosterone now, since his last levels were high, we have received another request.

## 2019-07-02 ENCOUNTER — Other Ambulatory Visit: Payer: Self-pay | Admitting: Family Medicine

## 2019-07-02 NOTE — Telephone Encounter (Signed)
Routing to provider  

## 2019-07-02 NOTE — Telephone Encounter (Signed)
Requested medication (s) are due for refill today: yes  Requested medication (s) are on the active medication list: yes  Last refill: 05/23/2019  Future visit scheduled: yes  Notes to clinic:  Refill cannot be delegated   Requested Prescriptions  Pending Prescriptions Disp Refills   traMADol (ULTRAM) 50 MG tablet [Pharmacy Med Name: TRAMADOL 50MG  TABLETS] 90 tablet     Sig: TAKE 2 TABLETS BY MOUTH EVERY MORNING AND 1 TABLET EVERY EVENING     Not Delegated - Analgesics:  Opioid Agonists Failed - 07/02/2019 10:03 AM      Failed - This refill cannot be delegated      Failed - Urine Drug Screen completed in last 360 days.      Passed - Valid encounter within last 6 months    Recent Outpatient Visits          2 months ago Routine general medical examination at a health care facility   [employer], [place] P, DO   4 months ago Chronic right-sided low back pain with bilateral sciatica   [place], [place] P, DO   8 months ago Primary osteoarthritis of both knees   [place], DO   1 year ago Dizziness   [facility] [place], [place]   1 year ago Routine general medical examination at a health care facility   [facility], Barb Merino, DO      Future Appointments            In 4 months Wynetta Emery, Barb Merino, DO [employer], [place]

## 2019-09-10 ENCOUNTER — Telehealth: Payer: Self-pay | Admitting: Family Medicine

## 2019-09-10 NOTE — Telephone Encounter (Signed)
Copied from [place] [phone]. Topic: Appointment Scheduling - Scheduling Inquiry for Clinic >> Sep 10, 2019 10:12 AM Scherrie Gerlach wrote: Reason for CRM: pt would like to know if Dr Wynetta Emery will work him in with his wife at her appt on 12/15?  Pt is having shoulder pain and states Dr Wynetta Emery has allowed them to come together in the past.

## 2019-09-13 NOTE — Telephone Encounter (Signed)
Yes, I should be able to do that- OK to put him at 8:45

## 2019-09-14 ENCOUNTER — Other Ambulatory Visit: Payer: Self-pay | Admitting: Family Medicine

## 2019-09-15 ENCOUNTER — Other Ambulatory Visit: Payer: Self-pay | Admitting: Family Medicine

## 2019-09-15 ENCOUNTER — Ambulatory Visit (INDEPENDENT_AMBULATORY_CARE_PROVIDER_SITE_OTHER): Payer: BC Managed Care – PPO | Admitting: Family Medicine

## 2019-09-15 ENCOUNTER — Other Ambulatory Visit: Payer: Self-pay

## 2019-09-15 ENCOUNTER — Encounter: Payer: Self-pay | Admitting: Family Medicine

## 2019-09-15 VITALS — BP 154/77 | HR 66 | Temp 98.6°F

## 2019-09-15 DIAGNOSIS — M25511 Pain in right shoulder: Secondary | ICD-10-CM

## 2019-09-15 DIAGNOSIS — Z23 Encounter for immunization: Secondary | ICD-10-CM

## 2019-09-15 MED ORDER — SILDENAFIL CITRATE 100 MG PO TABS: ORAL_TABLET | ORAL | 1 refills | Status: DC

## 2019-09-15 NOTE — Progress Notes (Signed)
BP (!) 154/77   Pulse 66   Temp 98.6 F (37 C)   SpO2 95%    Subjective:    Patient ID: Andrew [name], male    DOB: 07/05/1956, 63 y.o.   MRN: [id number]  HPI: Andrew [name] is a 63 y.o. male  Chief Complaint  Patient presents with  . Medication Refill    Viagra and Tramadol  . Shoulder Pain    Right   SHOULDER PAIN- slipped in the tub and fell on the toilet Duration: 4 months ago Involved shoulder: right Mechanism of injury: trauma Location: lateral Onset:sudden Severity: severe  Quality:  dull and aching, sharp with movement Frequency: constant Radiation: yes Aggravating factors: lifting and movement  Alleviating factors: putting his hand behind his head   Status: stable Treatments attempted: rest, ice, heat, sling, APAP, ibuprofen and aleve  Relief with NSAIDs?:  no Weakness: yes Numbness: yes Decreased grip strength: yes Redness: no Swelling: no Bruising: no Fevers: no  Relevant past medical, surgical, family and social history reviewed and updated as indicated. Interim medical history since our last visit reviewed. Allergies and medications reviewed and updated.  Review of Systems  Constitutional: Negative.   Respiratory: Negative.   Cardiovascular: Negative.   Gastrointestinal: Negative.   Musculoskeletal: Positive for arthralgias, back pain and myalgias. Negative for gait problem, joint swelling, neck pain and neck stiffness.  Neurological: Negative.   Psychiatric/Behavioral: Negative.     Per HPI unless specifically indicated above     Objective:    BP (!) 154/77   Pulse 66   Temp 98.6 F (37 C)   SpO2 95%   Wt Readings from Last 3 Encounters:  02/16/19 293 lb (132.9 kg)  10/17/18 284 lb (128.8 kg)  06/27/18 287 lb (130.2 kg)    Physical Exam Vitals and nursing note reviewed.  Constitutional:      General: He is not in acute distress.    Appearance: Normal appearance. He is not ill-appearing, toxic-appearing or diaphoretic.    HENT:     Head: Normocephalic and atraumatic.     Right Ear: External ear normal.     Left Ear: External ear normal.     Nose: Nose normal.     Mouth/Throat:     Mouth: Mucous membranes are moist.     Pharynx: Oropharynx is clear.  Eyes:     General: No scleral icterus.       Right eye: No discharge.        Left eye: No discharge.     Extraocular Movements: Extraocular movements intact.     Conjunctiva/sclera: Conjunctivae normal.     Pupils: Pupils are equal, round, and reactive to light.  Cardiovascular:     Rate and Rhythm: Normal rate and regular rhythm.     Pulses: Normal pulses.     Heart sounds: Normal heart sounds. No murmur. No friction rub. No gallop.   Pulmonary:     Effort: Pulmonary effort is normal. No respiratory distress.     Breath sounds: Normal breath sounds. No stridor. No wheezing, rhonchi or rales.  Chest:     Chest wall: No tenderness.  Musculoskeletal:        General: Normal range of motion.     Cervical back: Normal range of motion and neck supple.     Comments: Decreased active abduction, normal passive ROM, tenderness biceps tendon  Skin:    General: Skin is warm and dry.     Capillary Refill: Capillary  refill takes less than 2 seconds.     Coloration: Skin is not jaundiced or pale.     Findings: No bruising, erythema, lesion or rash.  Neurological:     General: No focal deficit present.     Mental Status: He is alert and oriented to person, place, and time. Mental status is at baseline.  Psychiatric:        Mood and Affect: Mood normal.        Behavior: Behavior normal.        Thought Content: Thought content normal.        Judgment: Judgment normal.     Results for orders placed or performed in visit on 05/01/19  Bayer DCA Hb A1c Waived  Result Value Ref Range   HB A1C (BAYER DCA - WAIVED) 6.6 <7.0 %  CBC with Differential/Platelet  Result Value Ref Range   WBC 7.9 3.4 - 10.8 x10E3/uL   RBC 5.29 4.14 - 5.80 x10E6/uL   Hemoglobin 16.7  13.0 - 17.7 g/dL   Hematocrit 49.2 37.5 - 51.0 %   MCV 93 79 - 97 fL   MCH 31.6 26.6 - 33.0 pg   MCHC 33.9 31.5 - 35.7 g/dL   RDW 13.9 11.6 - 15.4 %   Platelets 182 150 - 450 x10E3/uL   Neutrophils 67 Not Estab. %   Lymphs 18 Not Estab. %   Monocytes 9 Not Estab. %   Eos 4 Not Estab. %   Basos 1 Not Estab. %   Neutrophils Absolute 5.3 1.4 - 7.0 x10E3/uL   Lymphocytes Absolute 1.5 0.7 - 3.1 x10E3/uL   Monocytes Absolute 0.7 0.1 - 0.9 x10E3/uL   EOS (ABSOLUTE) 0.3 0.0 - 0.4 x10E3/uL   Basophils Absolute 0.1 0.0 - 0.2 x10E3/uL   Immature Granulocytes 1 Not Estab. %   Immature Grans (Abs) 0.0 0.0 - 0.1 x10E3/uL  Comprehensive metabolic panel  Result Value Ref Range   Glucose 191 (H) 65 - 99 mg/dL   BUN 19 8 - 27 mg/dL   Creatinine, Ser 1.00 0.76 - 1.27 mg/dL   GFR calc non Af Amer 81 >59 mL/min/1.73   GFR calc Af Amer 93 >59 mL/min/1.73   BUN/Creatinine Ratio 19 10 - 24   Sodium 137 134 - 144 mmol/L   Potassium 4.9 3.5 - 5.2 mmol/L   Chloride 97 96 - 106 mmol/L   CO2 26 20 - 29 mmol/L   Calcium 9.6 8.6 - 10.2 mg/dL   Total Protein 6.5 6.0 - 8.5 g/dL   Albumin 4.5 3.8 - 4.8 g/dL   Globulin, Total 2.0 1.5 - 4.5 g/dL   Albumin/Globulin Ratio 2.3 (H) 1.2 - 2.2   Bilirubin Total 0.8 0.0 - 1.2 mg/dL   Alkaline Phosphatase 92 39 - 117 IU/L   AST 31 0 - 40 IU/L   ALT 47 (H) 0 - 44 IU/L  Lipid Panel w/o Chol/HDL Ratio  Result Value Ref Range   Cholesterol, Total 188 100 - 199 mg/dL   Triglycerides 196 (H) 0 - 149 mg/dL   HDL 30 (L) >39 mg/dL   VLDL Cholesterol Cal 39 5 - 40 mg/dL   LDL Calculated 119 (H) 0 - 99 mg/dL  Microalbumin, Urine Waived  Result Value Ref Range   Microalb, Ur Waived 80 (H) 0 - 19 mg/L   Creatinine, Urine Waived 100 10 - 300 mg/dL   Microalb/Creat Ratio 30-300 (H) <30 mg/g  TSH  Result Value Ref Range   TSH 2.220 0.450 -  4.500 uIU/mL  UA/M w/rflx Culture, Routine   Specimen: Blood   BLD  Result Value Ref Range   Specific Gravity, UA 1.020 1.005 -  1.030   pH, UA 5.5 5.0 - 7.5   Color, UA Yellow Yellow   Appearance Ur Clear Clear   Leukocytes,UA Negative Negative   Protein,UA Negative Negative/Trace   Glucose, UA Negative Negative   Ketones, UA Negative Negative   RBC, UA Negative Negative   Bilirubin, UA Negative Negative   Urobilinogen, Ur 0.2 0.2 - 1.0 mg/dL   Nitrite, UA Negative Negative  PSA  Result Value Ref Range   Prostate Specific Ag, Serum 1.0 0.0 - 4.0 ng/mL  Testosterone, free, total([facility])  Result Value Ref Range   Testosterone 939 (H) 264 - 916 ng/dL   Testosterone, Free 21.6 (H) 6.6 - 18.1 pg/mL   Sex Hormone Binding 26.2 19.3 - 76.4 nmol/L  HM COLONOSCOPY  Result Value Ref Range   HM Colonoscopy Patient Reported See Report (in chart), Patient Reported      Assessment & Plan:   Problem List Items Addressed This Visit    None    Visit Diagnoses    Acute pain of right shoulder    -  Primary   Will get him into PT and obtain x-rays. May need shoulder MRI. Await results. Out of work until [employer] year as he works physical work. Call with any concerns.    Relevant Orders   DG Shoulder Right   Ambulatory referral to Physical Therapy   DG Cervical Spine Complete   Immunization due       Relevant Orders   Flu Vaccine QUAD 6+ mos PF IM (Fluarix Quad PF) (Completed)       Follow up plan: Return in about 4 weeks (around 10/13/2019).

## 2019-09-18 ENCOUNTER — Ambulatory Visit
Admission: RE | Admit: 2019-09-18 | Discharge: 2019-09-18 | Disposition: A | Discharge status: Home / Self Care | Payer: BC Managed Care – PPO | Source: Ambulatory Visit | Attending: Family Medicine | Admitting: Family Medicine

## 2019-09-18 ENCOUNTER — Other Ambulatory Visit: Payer: Self-pay

## 2019-09-18 DIAGNOSIS — M5441 Lumbago with sciatica, right side: Secondary | ICD-10-CM | POA: Diagnosis not present

## 2019-09-18 DIAGNOSIS — M5442 Lumbago with sciatica, left side: Secondary | ICD-10-CM | POA: Insufficient documentation

## 2019-09-18 DIAGNOSIS — M25551 Pain in right hip: Secondary | ICD-10-CM

## 2019-09-18 DIAGNOSIS — G8929 Other chronic pain: Secondary | ICD-10-CM | POA: Insufficient documentation

## 2019-09-18 DIAGNOSIS — M25511 Pain in right shoulder: Secondary | ICD-10-CM | POA: Diagnosis not present

## 2019-09-18 DIAGNOSIS — M47812 Spondylosis without myelopathy or radiculopathy, cervical region: Secondary | ICD-10-CM | POA: Diagnosis not present

## 2019-09-18 DIAGNOSIS — M545 Low back pain: Secondary | ICD-10-CM | POA: Diagnosis not present

## 2019-09-20 ENCOUNTER — Encounter: Payer: Self-pay | Admitting: Family Medicine

## 2019-09-20 DIAGNOSIS — M47816 Spondylosis without myelopathy or radiculopathy, lumbar region: Secondary | ICD-10-CM | POA: Insufficient documentation

## 2019-09-27 ENCOUNTER — Encounter: Payer: Self-pay | Admitting: Family Medicine

## 2019-09-27 DIAGNOSIS — M4722 Other spondylosis with radiculopathy, cervical region: Secondary | ICD-10-CM

## 2019-09-28 MED ORDER — PREDNISONE 10 MG PO TABS: ORAL_TABLET | ORAL | 0 refills | Status: DC

## 2019-10-08 ENCOUNTER — Other Ambulatory Visit: Payer: Self-pay

## 2019-10-08 ENCOUNTER — Encounter: Payer: Self-pay | Admitting: Family Medicine

## 2019-10-08 ENCOUNTER — Ambulatory Visit (INDEPENDENT_AMBULATORY_CARE_PROVIDER_SITE_OTHER): Payer: BC Managed Care – PPO | Admitting: Family Medicine

## 2019-10-08 VITALS — BP 145/79 | HR 77 | Wt 270.0 lb

## 2019-10-08 DIAGNOSIS — M4722 Other spondylosis with radiculopathy, cervical region: Secondary | ICD-10-CM

## 2019-10-08 NOTE — Progress Notes (Signed)
BP (!) 145/79   Pulse 77   Wt 270 lb (122.5 kg)   BMI 42.29 kg/m    Subjective:    Patient ID: Andrew [name], male    DOB: 09/04/1956, 64 y.o.   MRN: [id number]  HPI: Andrew [name] is a 64 y.o. male  Chief Complaint  Patient presents with  . Shoulder Pain  . Arm Pain  . Hip Pain   Doing better with the prednisone. He is feeling better. The aching and numbness has improved. The sharpness has decreased. He feels like the prednisone has really helped. He notes that he is seeing neurosurgery tomorrow. He is hoping they will help to get him feeling better. He is needing to be out of work a little longer as he works a very active job. He is otherwise doing well with no other concerns or complaints at this time.  Relevant past medical, surgical, family and social history reviewed and updated as indicated. Interim medical history since our last visit reviewed. Allergies and medications reviewed and updated.  Review of Systems  Constitutional: Negative.   Respiratory: Negative.   Cardiovascular: Negative.   Musculoskeletal: Positive for arthralgias, myalgias, neck pain and neck stiffness. Negative for back pain, gait problem and joint swelling.  Skin: Negative.   Neurological: Positive for weakness and numbness. Negative for dizziness, tremors, seizures, syncope, facial asymmetry, speech difficulty, light-headedness and headaches.  Hematological: Negative.   Psychiatric/Behavioral: Negative.     Per HPI unless specifically indicated above     Objective:    BP (!) 145/79   Pulse 77   Wt 270 lb (122.5 kg)   BMI 42.29 kg/m   Wt Readings from Last 3 Encounters:  10/08/19 270 lb (122.5 kg)  02/16/19 293 lb (132.9 kg)  10/17/18 284 lb (128.8 kg)    Physical Exam Vitals and nursing note reviewed.  Constitutional:      General: He is not in acute distress.    Appearance: Normal appearance. He is not ill-appearing, toxic-appearing or diaphoretic.  HENT:     Head:  Normocephalic and atraumatic.     Right Ear: External ear normal.     Left Ear: External ear normal.     Nose: Nose normal.     Mouth/Throat:     Mouth: Mucous membranes are moist.     Pharynx: Oropharynx is clear.  Eyes:     General: No scleral icterus.       Right eye: No discharge.        Left eye: No discharge.     Conjunctiva/sclera: Conjunctivae normal.     Pupils: Pupils are equal, round, and reactive to light.  Pulmonary:     Effort: Pulmonary effort is normal. No respiratory distress.     Comments: Speaking in full sentences Musculoskeletal:        General: Normal range of motion.     Cervical back: Normal range of motion.  Skin:    Coloration: Skin is not jaundiced or pale.     Findings: No bruising, erythema, lesion or rash.  Neurological:     Mental Status: He is alert and oriented to person, place, and time. Mental status is at baseline.  Psychiatric:        Mood and Affect: Mood normal.        Behavior: Behavior normal.        Thought Content: Thought content normal.        Judgment: Judgment normal.     Results  for orders placed or performed in visit on 05/01/19  Bayer DCA Hb A1c Waived  Result Value Ref Range   HB A1C (BAYER DCA - WAIVED) 6.6 <7.0 %  CBC with Differential/Platelet  Result Value Ref Range   WBC 7.9 3.4 - 10.8 x10E3/uL   RBC 5.29 4.14 - 5.80 x10E6/uL   Hemoglobin 16.7 13.0 - 17.7 g/dL   Hematocrit 49.2 37.5 - 51.0 %   MCV 93 79 - 97 fL   MCH 31.6 26.6 - 33.0 pg   MCHC 33.9 31.5 - 35.7 g/dL   RDW 13.9 11.6 - 15.4 %   Platelets 182 150 - 450 x10E3/uL   Neutrophils 67 Not Estab. %   Lymphs 18 Not Estab. %   Monocytes 9 Not Estab. %   Eos 4 Not Estab. %   Basos 1 Not Estab. %   Neutrophils Absolute 5.3 1.4 - 7.0 x10E3/uL   Lymphocytes Absolute 1.5 0.7 - 3.1 x10E3/uL   Monocytes Absolute 0.7 0.1 - 0.9 x10E3/uL   EOS (ABSOLUTE) 0.3 0.0 - 0.4 x10E3/uL   Basophils Absolute 0.1 0.0 - 0.2 x10E3/uL   Immature Granulocytes 1 Not Estab. %    Immature Grans (Abs) 0.0 0.0 - 0.1 x10E3/uL  Comprehensive metabolic panel  Result Value Ref Range   Glucose 191 (H) 65 - 99 mg/dL   BUN 19 8 - 27 mg/dL   Creatinine, Ser 1.00 0.76 - 1.27 mg/dL   GFR calc non Af Amer 81 >59 mL/min/1.73   GFR calc Af Amer 93 >59 mL/min/1.73   BUN/Creatinine Ratio 19 10 - 24   Sodium 137 134 - 144 mmol/L   Potassium 4.9 3.5 - 5.2 mmol/L   Chloride 97 96 - 106 mmol/L   CO2 26 20 - 29 mmol/L   Calcium 9.6 8.6 - 10.2 mg/dL   Total Protein 6.5 6.0 - 8.5 g/dL   Albumin 4.5 3.8 - 4.8 g/dL   Globulin, Total 2.0 1.5 - 4.5 g/dL   Albumin/Globulin Ratio 2.3 (H) 1.2 - 2.2   Bilirubin Total 0.8 0.0 - 1.2 mg/dL   Alkaline Phosphatase 92 39 - 117 IU/L   AST 31 0 - 40 IU/L   ALT 47 (H) 0 - 44 IU/L  Lipid Panel w/o Chol/HDL Ratio  Result Value Ref Range   Cholesterol, Total 188 100 - 199 mg/dL   Triglycerides 196 (H) 0 - 149 mg/dL   HDL 30 (L) >39 mg/dL   VLDL Cholesterol Cal 39 5 - 40 mg/dL   LDL Calculated 119 (H) 0 - 99 mg/dL  Microalbumin, Urine Waived  Result Value Ref Range   Microalb, Ur Waived 80 (H) 0 - 19 mg/L   Creatinine, Urine Waived 100 10 - 300 mg/dL   Microalb/Creat Ratio 30-300 (H) <30 mg/g  TSH  Result Value Ref Range   TSH 2.220 0.450 - 4.500 uIU/mL  UA/M w/rflx Culture, Routine   Specimen: Blood   BLD  Result Value Ref Range   Specific Gravity, UA 1.020 1.005 - 1.030   pH, UA 5.5 5.0 - 7.5   Color, UA Yellow Yellow   Appearance Ur Clear Clear   Leukocytes,UA Negative Negative   Protein,UA Negative Negative/Trace   Glucose, UA Negative Negative   Ketones, UA Negative Negative   RBC, UA Negative Negative   Bilirubin, UA Negative Negative   Urobilinogen, Ur 0.2 0.2 - 1.0 mg/dL   Nitrite, UA Negative Negative  PSA  Result Value Ref Range   Prostate Specific Ag, Serum  1.0 0.0 - 4.0 ng/mL  Testosterone, free, total([facility])  Result Value Ref Range   Testosterone 939 (H) 264 - 916 ng/dL   Testosterone, Free 21.6 (H) 6.6  - 18.1 pg/mL   Sex Hormone Binding 26.2 19.3 - 76.4 nmol/L  HM COLONOSCOPY  Result Value Ref Range   HM Colonoscopy Patient Reported See Report (in chart), Patient Reported      Assessment & Plan:   Problem List Items Addressed This Visit    None    Visit Diagnoses    Osteoarthritis of spine with radiculopathy, cervical region    -  Primary   Doing better with the prednisone. Due to see neurosurgery tomorrow. Call with any concerns. continue to monitor. out of work another week.        Follow up plan: Return in about 7 months (around 05/07/2020) for Physical.    . This visit was completed via [employer] due to the restrictions of the COVID-19 pandemic. All issues as above were discussed and addressed. Physical exam was done as above through visual confirmation on [employer]. If it was felt that the patient should be evaluated in the office, they were directed there. The patient verbally consented to this visit. . Location of the patient: home . Location of the provider: work . Those involved with this call:  . Provider: Park Liter, DO . CMA: Tiffany Reel, CMA . Front Desk/Registration: Don Perking  . Time spent on call: 15 minutes with patient face to face via video conference. More than 50% of this time was spent in counseling and coordination of care. 23 minutes total spent in review of patient's record and preparation of their chart.

## 2019-10-09 DIAGNOSIS — G8929 Other chronic pain: Secondary | ICD-10-CM | POA: Insufficient documentation

## 2019-10-09 DIAGNOSIS — M25511 Pain in right shoulder: Secondary | ICD-10-CM | POA: Diagnosis not present

## 2019-10-09 DIAGNOSIS — M5442 Lumbago with sciatica, left side: Secondary | ICD-10-CM | POA: Diagnosis not present

## 2019-10-09 DIAGNOSIS — M5441 Lumbago with sciatica, right side: Secondary | ICD-10-CM | POA: Diagnosis not present

## 2019-10-09 DIAGNOSIS — M545 Low back pain, unspecified: Secondary | ICD-10-CM | POA: Insufficient documentation

## 2019-10-17 ENCOUNTER — Other Ambulatory Visit: Payer: Self-pay | Admitting: Family Medicine

## 2019-10-18 NOTE — Telephone Encounter (Signed)
Requested medication (s) are due for refill today: yes  Requested medication (s) are on the active medication list: yes  Last refill:  07/03/19 #30 with 2 refills  Future visit scheduled: yes  Notes to clinic:  Refill not delegated per protocol    Requested Prescriptions  Pending Prescriptions Disp Refills   traMADol (ULTRAM) 50 MG tablet [Pharmacy Med Name: TRAMADOL 50MG  TABLETS] 90 tablet     Sig: TAKE 2 TABLETS BY MOUTH EVERY MORNING AND 1 TABLET EVERY EVENING      Not Delegated - Analgesics:  Opioid Agonists Failed - 10/17/2019  9:48 AM      Failed - This refill cannot be delegated      Failed - Urine Drug Screen completed in last 360 days.      Passed - Valid encounter within last 6 months    Recent Outpatient Visits           1 week ago Osteoarthritis of spine with radiculopathy, cervical region   [facility], [place] P, DO   1 month ago Acute pain of right shoulder   [place], DO   5 months ago Routine general medical examination at a health care facility   [facility], [place] P, DO   8 months ago Chronic right-sided low back pain with bilateral sciatica   [place], Megan P, DO   1 year ago Primary osteoarthritis of both knees   [place], [place], DO       Future Appointments             In 2 weeks Wynetta Emery, Barb Merino, DO [employer]

## 2019-10-21 ENCOUNTER — Encounter: Payer: Self-pay | Admitting: Family Medicine

## 2019-10-29 DIAGNOSIS — M25511 Pain in right shoulder: Secondary | ICD-10-CM | POA: Diagnosis not present

## 2019-11-03 ENCOUNTER — Encounter: Payer: Self-pay | Admitting: Family Medicine

## 2019-11-06 ENCOUNTER — Encounter: Payer: Self-pay | Admitting: Family Medicine

## 2019-11-06 ENCOUNTER — Ambulatory Visit (INDEPENDENT_AMBULATORY_CARE_PROVIDER_SITE_OTHER): Payer: BC Managed Care – PPO | Admitting: Family Medicine

## 2019-11-06 ENCOUNTER — Other Ambulatory Visit: Payer: Self-pay

## 2019-11-06 VITALS — BP 124/78 | HR 65 | Temp 98.2°F

## 2019-11-06 DIAGNOSIS — E1165 Type 2 diabetes mellitus with hyperglycemia: Secondary | ICD-10-CM

## 2019-11-06 DIAGNOSIS — I129 Hypertensive chronic kidney disease with stage 1 through stage 4 chronic kidney disease, or unspecified chronic kidney disease: Secondary | ICD-10-CM

## 2019-11-06 DIAGNOSIS — E291 Testicular hypofunction: Secondary | ICD-10-CM | POA: Diagnosis not present

## 2019-11-06 DIAGNOSIS — R631 Polydipsia: Secondary | ICD-10-CM | POA: Diagnosis not present

## 2019-11-06 DIAGNOSIS — E1129 Type 2 diabetes mellitus with other diabetic kidney complication: Secondary | ICD-10-CM | POA: Insufficient documentation

## 2019-11-06 LAB — BAYER DCA HB A1C WAIVED: HB A1C (BAYER DCA - WAIVED): 12.2 % — ABNORMAL HIGH (ref <7.0)

## 2019-11-06 MED ORDER — METFORMIN HCL ER 500 MG PO TB24: 500.0000 mg | ORAL_TABLET | Freq: Every day | ORAL | 2 refills | Status: DC

## 2019-11-06 NOTE — Assessment & Plan Note (Signed)
Under good control on current regimen. Continue current regimen. Continue to monitor. Call with any concerns. Refills will be given pending results.

## 2019-11-06 NOTE — Assessment & Plan Note (Signed)
Newly diagnosed today with A1c of 12.2- has been on prednisone and got a steroid shot in his shoulder likely pushing him up. We will start metformin in the AM and recheck 1 month. Will get him into the [facility]. Work on diet and exercise. On ACE. Will hold on statin as want to see how he does on metformin first. Call with any concerns.

## 2019-11-06 NOTE — Assessment & Plan Note (Signed)
Under good control on current regimen. Continue current regimen. Continue to monitor. Call with any concerns. Refills given. Labs checked today.  

## 2019-11-06 NOTE — Progress Notes (Signed)
BP 124/78 (BP Location: Left Arm, Cuff Size: Normal)   Pulse 65   Temp 98.2 F (36.8 C) (Oral)   SpO2 93%    Subjective:    Patient ID: Andrew [name], male    DOB: 06/09/1956, 64 y.o.   MRN: [id number]  HPI: Andrew [name] is a 64 y.o. male  Chief Complaint  Patient presents with  . Blurred Vision    started 2 wks ago  . Polydipsia    started 2 wks ago   HYPERTENSION Hypertension status: controlled  Satisfied with current treatment? yes Duration of hypertension: chronic BP monitoring frequency:  not checking BP medication side effects:  no Medication compliance: excellent compliance Previous BP meds: metoprolol, HCTZ, benazepril Aspirin: no Recurrent headaches: no Visual changes: yes Palpitations: no Dyspnea: no Chest pain: no Lower extremity edema: no Dizzy/lightheaded: no  DIABETES Hypoglycemic episodes:no Polydipsia/polyuria: yes Visual disturbance: yes Chest pain: no Paresthesias: no Glucose Monitoring: no Taking Insulin?: no Blood Pressure Monitoring: not checking Retinal Examination: Up to Date Foot Exam: Not up to Date Diabetic Education: Not Completed Pneumovax: Not up to Date Influenza: Up to Date Aspirin: no  LOW TESTOSTERONE Duration: chronic Status: controlled  Satisfied with current treatment:  yes Previous testosterone therapies: Medication side effects:  no Medication compliance: excellent compliance Decreased libido: no Fatigue: no Depressed mood: no Muscle weakness: no Erectile dysfunction: no   Has been drinking a lot and peeing most  Blurred vision.  Relevant past medical, surgical, family and social history reviewed and updated as indicated. Interim medical history since our last visit reviewed. Allergies and medications reviewed and updated.  Review of Systems  Constitutional: Negative.   Eyes: Positive for visual disturbance. Negative for photophobia, pain, discharge, redness and itching.  Respiratory: Negative.    Cardiovascular: Negative.   Musculoskeletal: Positive for arthralgias, back pain, joint swelling, myalgias, neck pain and neck stiffness. Negative for gait problem.  Skin: Negative.   Neurological: Positive for dizziness. Negative for tremors, seizures, syncope, facial asymmetry, speech difficulty, weakness, light-headedness, numbness and headaches.  Psychiatric/Behavioral: Negative.     Per HPI unless specifically indicated above     Objective:    BP 124/78 (BP Location: Left Arm, Cuff Size: Normal)   Pulse 65   Temp 98.2 F (36.8 C) (Oral)   SpO2 93%   Wt Readings from Last 3 Encounters:  10/08/19 270 lb (122.5 kg)  02/16/19 293 lb (132.9 kg)  10/17/18 284 lb (128.8 kg)    Physical Exam Vitals and nursing note reviewed.  Constitutional:      General: He is not in acute distress.    Appearance: Normal appearance. He is not ill-appearing, toxic-appearing or diaphoretic.  HENT:     Head: Normocephalic and atraumatic.     Right Ear: External ear normal.     Left Ear: External ear normal.     Nose: Nose normal.     Mouth/Throat:     Mouth: Mucous membranes are moist.     Pharynx: Oropharynx is clear.  Eyes:     General: No scleral icterus.       Right eye: No discharge.        Left eye: No discharge.     Extraocular Movements: Extraocular movements intact.     Conjunctiva/sclera: Conjunctivae normal.     Pupils: Pupils are equal, round, and reactive to light.  Cardiovascular:     Rate and Rhythm: Normal rate and regular rhythm.     Pulses: Normal pulses.  Heart sounds: Normal heart sounds. No murmur. No friction rub. No gallop.   Pulmonary:     Effort: Pulmonary effort is normal. No respiratory distress.     Breath sounds: Normal breath sounds. No stridor. No wheezing, rhonchi or rales.  Chest:     Chest wall: No tenderness.  Musculoskeletal:        General: Normal range of motion.     Cervical back: Normal range of motion and neck supple.  Skin:    General:  Skin is warm and dry.     Capillary Refill: Capillary refill takes less than 2 seconds.     Coloration: Skin is not jaundiced or pale.     Findings: No bruising, erythema, lesion or rash.  Neurological:     General: No focal deficit present.     Mental Status: He is alert and oriented to person, place, and time. Mental status is at baseline.  Psychiatric:        Mood and Affect: Mood normal.        Behavior: Behavior normal.        Thought Content: Thought content normal.        Judgment: Judgment normal.     Results for orders placed or performed in visit on 05/01/19  Bayer DCA Hb A1c Waived  Result Value Ref Range   HB A1C (BAYER DCA - WAIVED) 6.6 <7.0 %  CBC with Differential/Platelet  Result Value Ref Range   WBC 7.9 3.4 - 10.8 x10E3/uL   RBC 5.29 4.14 - 5.80 x10E6/uL   Hemoglobin 16.7 13.0 - 17.7 g/dL   Hematocrit 49.2 37.5 - 51.0 %   MCV 93 79 - 97 fL   MCH 31.6 26.6 - 33.0 pg   MCHC 33.9 31.5 - 35.7 g/dL   RDW 13.9 11.6 - 15.4 %   Platelets 182 150 - 450 x10E3/uL   Neutrophils 67 Not Estab. %   Lymphs 18 Not Estab. %   Monocytes 9 Not Estab. %   Eos 4 Not Estab. %   Basos 1 Not Estab. %   Neutrophils Absolute 5.3 1.4 - 7.0 x10E3/uL   Lymphocytes Absolute 1.5 0.7 - 3.1 x10E3/uL   Monocytes Absolute 0.7 0.1 - 0.9 x10E3/uL   EOS (ABSOLUTE) 0.3 0.0 - 0.4 x10E3/uL   Basophils Absolute 0.1 0.0 - 0.2 x10E3/uL   Immature Granulocytes 1 Not Estab. %   Immature Grans (Abs) 0.0 0.0 - 0.1 x10E3/uL  Comprehensive metabolic panel  Result Value Ref Range   Glucose 191 (H) 65 - 99 mg/dL   BUN 19 8 - 27 mg/dL   Creatinine, Ser 1.00 0.76 - 1.27 mg/dL   GFR calc non Af Amer 81 >59 mL/min/1.73   GFR calc Af Amer 93 >59 mL/min/1.73   BUN/Creatinine Ratio 19 10 - 24   Sodium 137 134 - 144 mmol/L   Potassium 4.9 3.5 - 5.2 mmol/L   Chloride 97 96 - 106 mmol/L   CO2 26 20 - 29 mmol/L   Calcium 9.6 8.6 - 10.2 mg/dL   Total Protein 6.5 6.0 - 8.5 g/dL   Albumin 4.5 3.8 - 4.8 g/dL    Globulin, Total 2.0 1.5 - 4.5 g/dL   Albumin/Globulin Ratio 2.3 (H) 1.2 - 2.2   Bilirubin Total 0.8 0.0 - 1.2 mg/dL   Alkaline Phosphatase 92 39 - 117 IU/L   AST 31 0 - 40 IU/L   ALT 47 (H) 0 - 44 IU/L  Lipid Panel w/o Chol/HDL Ratio  Result Value Ref Range   Cholesterol, Total 188 100 - 199 mg/dL   Triglycerides 196 (H) 0 - 149 mg/dL   HDL 30 (L) >39 mg/dL   VLDL Cholesterol Cal 39 5 - 40 mg/dL   LDL Calculated 119 (H) 0 - 99 mg/dL  Microalbumin, Urine Waived  Result Value Ref Range   Microalb, Ur Waived 80 (H) 0 - 19 mg/L   Creatinine, Urine Waived 100 10 - 300 mg/dL   Microalb/Creat Ratio 30-300 (H) <30 mg/g  TSH  Result Value Ref Range   TSH 2.220 0.450 - 4.500 uIU/mL  UA/M w/rflx Culture, Routine   Specimen: Blood   BLD  Result Value Ref Range   Specific Gravity, UA 1.020 1.005 - 1.030   pH, UA 5.5 5.0 - 7.5   Color, UA Yellow Yellow   Appearance Ur Clear Clear   Leukocytes,UA Negative Negative   Protein,UA Negative Negative/Trace   Glucose, UA Negative Negative   Ketones, UA Negative Negative   RBC, UA Negative Negative   Bilirubin, UA Negative Negative   Urobilinogen, Ur 0.2 0.2 - 1.0 mg/dL   Nitrite, UA Negative Negative  PSA  Result Value Ref Range   Prostate Specific Ag, Serum 1.0 0.0 - 4.0 ng/mL  Testosterone, free, total([facility])  Result Value Ref Range   Testosterone 939 (H) 264 - 916 ng/dL   Testosterone, Free 21.6 (H) 6.6 - 18.1 pg/mL   Sex Hormone Binding 26.2 19.3 - 76.4 nmol/L  HM COLONOSCOPY  Result Value Ref Range   HM Colonoscopy Patient Reported See Report (in chart), Patient Reported      Assessment & Plan:   Problem List Items Addressed This Visit      Endocrine   Hypogonadism in male    Under good control on current regimen. Continue current regimen. Continue to monitor. Call with any concerns. Refills will be given pending results.        Relevant Orders   Comprehensive metabolic panel   Testosterone, free,  total([facility])   PSA   Uncontrolled type 2 diabetes mellitus with hyperglycemia ([place]) - Primary    Newly diagnosed today with A1c of 12.2- has been on prednisone and got a steroid shot in his shoulder likely pushing him up. We will start metformin in the AM and recheck 1 month. Will get him into the [facility]. Work on diet and exercise. On ACE. Will hold on statin as want to see how he does on metformin first. Call with any concerns.       Relevant Medications   metFORMIN (GLUCOPHAGE-XR) 500 MG 24 hr tablet   Other Relevant Orders   Ambulatory referral to diabetic education     Genitourinary   Benign hypertensive renal disease    Under good control on current regimen. Continue current regimen. Continue to monitor. Call with any concerns. Refills given. Labs checked today.       Relevant Orders   Comprehensive metabolic panel   CBC with Differential/Platelet    Other Visit Diagnoses    Polydipsia       Will check A1c.    Relevant Orders   Bayer DCA Hb A1c Waived       Follow up plan: Return in about 4 weeks (around 12/04/2019).

## 2019-11-06 NOTE — Patient Instructions (Signed)
Diabetes Mellitus and Exercise Exercising regularly is important for your overall health, especially when you have diabetes (diabetes mellitus). Exercising is not only about losing weight. It has many other health benefits, such as increasing muscle strength and bone density and reducing body fat and stress. This leads to improved fitness, flexibility, and endurance, all of which result in better overall health. Exercise has additional benefits for people with diabetes, including:  Reducing appetite.  Helping to lower and control blood glucose.  Lowering blood pressure.  Helping to control amounts of fatty substances (lipids) in the blood, such as cholesterol and triglycerides.  Helping the body to respond better to insulin (improving insulin sensitivity).  Reducing how much insulin the body needs.  Decreasing the risk for heart disease by: ? Lowering cholesterol and triglyceride levels. ? Increasing the levels of good cholesterol. ? Lowering blood glucose levels. What is my activity plan? Your health care provider or certified diabetes educator can help you make a plan for the type and frequency of exercise (activity plan) that works for you. Make sure that you:  Do at least 150 minutes of moderate-intensity or vigorous-intensity exercise each week. This could be brisk walking, biking, or water aerobics. ? Do stretching and strength exercises, such as yoga or weightlifting, at least 2 times a week. ? Spread out your activity over at least 3 days of the week.  Get some form of physical activity every day. ? Do not go more than 2 days in a row without some kind of physical activity. ? Avoid being inactive for more than 30 minutes at a time. Take frequent breaks to walk or stretch.  Choose a type of exercise or activity that you enjoy, and set realistic goals.  Start slowly, and gradually increase the intensity of your exercise over time. What do I need to know about managing my  diabetes?   Check your blood glucose before and after exercising. ? If your blood glucose is 240 mg/dL (13.3 mmol/L) or higher before you exercise, check your urine for ketones. If you have ketones in your urine, do not exercise until your blood glucose returns to normal. ? If your blood glucose is 100 mg/dL (5.6 mmol/L) or lower, eat a snack containing 15-20 grams of carbohydrate. Check your blood glucose 15 minutes after the snack to make sure that your level is above 100 mg/dL (5.6 mmol/L) before you start your exercise.  Know the symptoms of low blood glucose (hypoglycemia) and how to treat it. Your risk for hypoglycemia increases during and after exercise. Common symptoms of hypoglycemia can include: ? Hunger. ? Anxiety. ? Sweating and feeling clammy. ? Confusion. ? Dizziness or feeling light-headed. ? Increased heart rate or palpitations. ? Blurry vision. ? Tingling or numbness around the mouth, lips, or tongue. ? Tremors or shakes. ? Irritability.  Keep a rapid-acting carbohydrate snack available before, during, and after exercise to help prevent or treat hypoglycemia.  Avoid injecting insulin into areas of the body that are going to be exercised. For example, avoid injecting insulin into: ? The arms, when playing tennis. ? The legs, when jogging.  Keep records of your exercise habits. Doing this can help you and your health care provider adjust your diabetes management plan as needed. Write down: ? Food that you eat before and after you exercise. ? Blood glucose levels before and after you exercise. ? The type and amount of exercise you have done. ? When your insulin is expected to peak, if you use   insulin. Avoid exercising at times when your insulin is peaking.  When you start a new exercise or activity, work with your health care provider to make sure the activity is safe for you, and to adjust your insulin, medicines, or food intake as needed.  Drink plenty of water while  you exercise to prevent dehydration or heat stroke. Drink enough fluid to keep your urine clear or pale yellow. Summary  Exercising regularly is important for your overall health, especially when you have diabetes (diabetes mellitus).  Exercising has many health benefits, such as increasing muscle strength and bone density and reducing body fat and stress.  Your health care provider or certified diabetes educator can help you make a plan for the type and frequency of exercise (activity plan) that works for you.  When you start a new exercise or activity, work with your health care provider to make sure the activity is safe for you, and to adjust your insulin, medicines, or food intake as needed. This information is not intended to replace advice given to you by your health care provider. Make sure you discuss any questions you have with your health care provider. Document Revised: 04/11/2017 Document Reviewed: 02/27/2016 Elsevier Patient Education  2020 [employer]  Diabetes Mellitus and Nutrition, Adult When you have diabetes (diabetes mellitus), it is very important to have healthy eating habits because your blood sugar (glucose) levels are greatly affected by what you eat and drink. Eating healthy foods in the appropriate amounts, at about the same times every day, can help you:  Control your blood glucose.  Lower your risk of heart disease.  Improve your blood pressure.  Reach or maintain a healthy weight. Every person with diabetes is different, and each person has different needs for a meal plan. Your health care provider may recommend that you work with a diet and nutrition specialist (dietitian) to make a meal plan that is best for you. Your meal plan may vary depending on factors such as:  The calories you need.  The medicines you take.  Your weight.  Your blood glucose, blood pressure, and cholesterol levels.  Your activity level.  Other health conditions you have,  such as heart or kidney disease. How do carbohydrates affect me? Carbohydrates, also called carbs, affect your blood glucose level more than any other type of food. Eating carbs naturally raises the amount of glucose in your blood. Carb counting is a method for keeping track of how many carbs you eat. Counting carbs is important to keep your blood glucose at a healthy level, especially if you use insulin or take certain oral diabetes medicines. It is important to know how many carbs you can safely have in each meal. This is different for every person. Your dietitian can help you calculate how many carbs you should have at each meal and for each snack. Foods that contain carbs include:  Bread, cereal, rice, pasta, and crackers.  Potatoes and corn.  Peas, beans, and lentils.  Milk and yogurt.  Fruit and juice.  Desserts, such as cakes, cookies, ice cream, and candy. How does alcohol affect me? Alcohol can cause a sudden decrease in blood glucose (hypoglycemia), especially if you use insulin or take certain oral diabetes medicines. Hypoglycemia can be a life-threatening condition. Symptoms of hypoglycemia (sleepiness, dizziness, and confusion) are similar to symptoms of having too much alcohol. If your health care provider says that alcohol is safe for you, follow these guidelines:  Limit alcohol intake to no more   than 1 drink per day for nonpregnant women and 2 drinks per day for men. One drink equals 12 oz of beer, 5 oz of wine, or 1 oz of hard liquor.  Do not drink on an empty stomach.  Keep yourself hydrated with water, diet soda, or unsweetened iced tea.  Keep in mind that regular soda, juice, and other mixers may contain a lot of sugar and must be counted as carbs. What are tips for following this plan?  Reading food labels  Start by checking the serving size on the "Nutrition Facts" label of packaged foods and drinks. The amount of calories, carbs, fats, and other nutrients  listed on the label is based on one serving of the item. Many items contain more than one serving per package.  Check the total grams (g) of carbs in one serving. You can calculate the number of servings of carbs in one serving by dividing the total carbs by 15. For example, if a food has 30 g of total carbs, it would be equal to 2 servings of carbs.  Check the number of grams (g) of saturated and trans fats in one serving. Choose foods that have low or no amount of these fats.  Check the number of milligrams (mg) of salt (sodium) in one serving. Most people should limit total sodium intake to less than 2,300 mg per day.  Always check the nutrition information of foods labeled as "low-fat" or "nonfat". These foods may be higher in added sugar or refined carbs and should be avoided.  Talk to your dietitian to identify your daily goals for nutrients listed on the label. Shopping  Avoid buying canned, premade, or processed foods. These foods tend to be high in fat, sodium, and added sugar.  Shop around the outside edge of the grocery store. This includes fresh fruits and vegetables, bulk grains, fresh meats, and fresh dairy. Cooking  Use low-heat cooking methods, such as baking, instead of high-heat cooking methods like deep frying.  Cook using healthy oils, such as olive, canola, or sunflower oil.  Avoid cooking with butter, cream, or high-fat meats. Meal planning  Eat meals and snacks regularly, preferably at the same times every day. Avoid going long periods of time without eating.  Eat foods high in fiber, such as fresh fruits, vegetables, beans, and whole grains. Talk to your dietitian about how many servings of carbs you can eat at each meal.  Eat 4-6 ounces (oz) of lean protein each day, such as lean meat, chicken, fish, eggs, or tofu. One oz of lean protein is equal to: ? 1 oz of meat, chicken, or fish. ? 1 egg. ?  cup of tofu.  Eat some foods each day that contain healthy  fats, such as avocado, nuts, seeds, and fish. Lifestyle  Check your blood glucose regularly.  Exercise regularly as told by your health care provider. This may include: ? 150 minutes of moderate-intensity or vigorous-intensity exercise each week. This could be brisk walking, biking, or water aerobics. ? Stretching and doing strength exercises, such as yoga or weightlifting, at least 2 times a week.  Take medicines as told by your health care provider.  Do not use any products that contain nicotine or tobacco, such as cigarettes and e-cigarettes. If you need help quitting, ask your health care provider.  Work with a counselor or diabetes educator to identify strategies to manage stress and any emotional and social challenges. Questions to ask a health care provider  Do I   need to meet with a diabetes educator?  Do I need to meet with a dietitian?  What number can I call if I have questions?  When are the best times to check my blood glucose? Where to find more information:  American Diabetes Association: diabetes.org  Academy of Nutrition and Dietetics: www.eatright.[employer] of Diabetes and Digestive and Kidney Diseases (NIH): [employer] Summary  A healthy meal plan will help you control your blood glucose and maintain a healthy lifestyle.  Working with a diet and nutrition specialist (dietitian) can help you make a meal plan that is best for you.  Keep in mind that carbohydrates (carbs) and alcohol have immediate effects on your blood glucose levels. It is important to count carbs and to use alcohol carefully. This information is not intended to replace advice given to you by your health care provider. Make sure you discuss any questions you have with your health care provider. Document Revised: 08/30/2017 Document Reviewed: 10/22/2016 Elsevier Patient Education  2020 [place].  Diabetes Mellitus and Standards of Medical Care Managing diabetes  (diabetes mellitus) can be complicated. Your diabetes treatment may be managed by a team of health care providers, including:  A physician who specializes in diabetes (endocrinologist).  A nurse practitioner or physician assistant.  Nurses.  A diet and nutrition specialist (registered dietitian).  A certified diabetes educator (CDE).  An exercise specialist.  A pharmacist.  An eye doctor.  A foot specialist (podiatrist).  A dentist.  A primary care provider.  A mental health provider. Your health care providers follow guidelines to help you get the best quality of care. The following schedule is a general guideline for your diabetes management plan. Your health care providers may give you more specific instructions. Physical exams Upon being diagnosed with diabetes mellitus, and each year after that, your health care provider will ask about your medical and family history. He or she will also do a physical exam. Your exam may include:  Measuring your height, weight, and body mass index (BMI).  Checking your blood pressure. This will be done at every routine medical visit. Your target blood pressure may vary depending on your medical conditions, your age, and other factors.  Thyroid gland exam.  Skin exam.  Screening for damage to your nerves (peripheral neuropathy). This may include checking the pulse in your legs and feet and checking the level of sensation in your hands and feet.  A complete foot exam to inspect the structure and skin of your feet, including checking for cuts, bruises, redness, blisters, sores, or other problems.  Screening for blood vessel (vascular) problems, which may include checking the pulse in your legs and feet and checking your temperature. Blood tests Depending on your treatment plan and your personal needs, you may have the following tests done:  HbA1c (hemoglobin A1c). This test provides information about blood sugar (glucose) control over  the previous 2-3 months. It is used to adjust your treatment plan, if needed. This test will be done: ? At least 2 times a year, if you are meeting your treatment goals. ? 4 times a year, if you are not meeting your treatment goals or if treatment goals have changed.  Lipid testing, including total, LDL, and HDL cholesterol and triglyceride levels. ? The goal for LDL is less than 100 mg/dL (5.5 mmol/L). If you are at high risk for complications, the goal is less than 70 mg/dL (3.9 mmol/L). ? The goal for HDL is 40 mg/dL (2.2  mmol/L) or higher for men and 50 mg/dL (2.8 mmol/L) or higher for women. An HDL cholesterol of 60 mg/dL (3.3 mmol/L) or higher gives some protection against heart disease. ? The goal for triglycerides is less than 150 mg/dL (8.3 mmol/L).  Liver function tests.  Kidney function tests.  Thyroid function tests. Dental and eye exams  Visit your dentist two times a year.  If you have type 1 diabetes, your health care provider may recommend an eye exam 3-5 years after you are diagnosed, and then once a year after your first exam. ? For children with type 1 diabetes, a health care provider may recommend an eye exam when your child is age 68 or older and has had diabetes for 3-5 years. After the first exam, your child should get an eye exam once a year.  If you have type 2 diabetes, your health care provider may recommend an eye exam as soon as you are diagnosed, and then once a year after your first exam. Immunizations   The yearly flu (influenza) vaccine is recommended for everyone 6 months or older who has diabetes.  The pneumonia (pneumococcal) vaccine is recommended for everyone 2 years or older who has diabetes. If you are 21 or older, you may get the pneumonia vaccine as a series of two separate shots.  The hepatitis B vaccine is recommended for adults shortly after being diagnosed with diabetes.  Adults and children with diabetes should receive all other vaccines  according to age-specific recommendations from the Centers for Disease Control and Prevention (CDC). Mental and emotional health Screening for symptoms of eating disorders, anxiety, and depression is recommended at the time of diagnosis and afterward as needed. If your screening shows that you have symptoms (positive screening result), you may need more evaluation and you may work with a mental health care provider. Treatment plan Your treatment plan will be reviewed at every medical visit. You and your health care provider will discuss:  How you are taking your medicines, including insulin.  Any side effects you are experiencing.  Your blood glucose target goals.  The frequency of your blood glucose monitoring.  Lifestyle habits, such as activity level as well as tobacco, alcohol, and substance use. Diabetes self-management education Your health care provider will assess how well you are monitoring your blood glucose levels and whether you are taking your insulin correctly. He or she may refer you to:  A certified diabetes educator to manage your diabetes throughout your life, starting at diagnosis.  A registered dietitian who can create or review your personal nutrition plan.  An exercise specialist who can discuss your activity level and exercise plan. Summary  Managing diabetes (diabetes mellitus) can be complicated. Your diabetes treatment may be managed by a team of health care providers.  Your health care providers follow guidelines in order to help you get the best quality of care.  Standards of care including having regular physical exams, blood tests, blood pressure monitoring, immunizations, screening tests, and education about how to manage your diabetes.  Your health care providers may also give you more specific instructions based on your individual health. This information is not intended to replace advice given to you by your health care provider. Make sure you discuss  any questions you have with your health care provider. Document Revised: 06/06/2018 Document Reviewed: 06/15/2016 Elsevier Patient Education  [place].

## 2019-11-09 ENCOUNTER — Telehealth: Payer: Self-pay

## 2019-11-09 LAB — COMPREHENSIVE METABOLIC PANEL
ALT: 70 IU/L — ABNORMAL HIGH (ref 0–44)
AST: 37 IU/L (ref 0–40)
Albumin/Globulin Ratio: 2.3 — ABNORMAL HIGH (ref 1.2–2.2)
Albumin: 4.1 g/dL (ref 3.8–4.8)
Alkaline Phosphatase: 127 IU/L — ABNORMAL HIGH (ref 39–117)
BUN/Creatinine Ratio: 19 (ref 10–24)
BUN: 19 mg/dL (ref 8–27)
Bilirubin Total: 0.6 mg/dL (ref 0.0–1.2)
CO2: 23 mmol/L (ref 20–29)
Calcium: 9.3 mg/dL (ref 8.6–10.2)
Chloride: 96 mmol/L (ref 96–106)
Creatinine, Ser: 1.01 mg/dL (ref 0.76–1.27)
GFR calc Af Amer: 92 mL/min/{1.73_m2} (ref >59)
GFR calc non Af Amer: 80 mL/min/{1.73_m2} (ref >59)
Globulin, Total: 1.8 g/dL (ref 1.5–4.5)
Glucose: 381 mg/dL — ABNORMAL HIGH (ref 65–99)
Potassium: 4 mmol/L (ref 3.5–5.2)
Sodium: 136 mmol/L (ref 134–144)
Total Protein: 5.9 g/dL — ABNORMAL LOW (ref 6.0–8.5)

## 2019-11-09 LAB — CBC WITH DIFFERENTIAL/PLATELET
Basophils Absolute: 0.1 10*3/uL (ref 0.0–0.2)
Basos: 1 %
EOS (ABSOLUTE): 0.1 10*3/uL (ref 0.0–0.4)
Eos: 2 %
Hematocrit: 50.8 % (ref 37.5–51.0)
Hemoglobin: 17.7 g/dL (ref 13.0–17.7)
Immature Grans (Abs): 0.1 10*3/uL (ref 0.0–0.1)
Immature Granulocytes: 1 %
Lymphocytes Absolute: 1.7 10*3/uL (ref 0.7–3.1)
Lymphs: 24 %
MCH: 32.1 pg (ref 26.6–33.0)
MCHC: 34.8 g/dL (ref 31.5–35.7)
MCV: 92 fL (ref 79–97)
Monocytes Absolute: 0.7 10*3/uL (ref 0.1–0.9)
Monocytes: 10 %
Neutrophils Absolute: 4.3 10*3/uL (ref 1.4–7.0)
Neutrophils: 62 %
Platelets: 134 10*3/uL — ABNORMAL LOW (ref 150–450)
RBC: 5.52 x10E6/uL (ref 4.14–5.80)
RDW: 13.7 % (ref 11.6–15.4)
WBC: 7 10*3/uL (ref 3.4–10.8)

## 2019-11-09 LAB — TESTOSTERONE, FREE, TOTAL, SHBG
Sex Hormone Binding: 17.4 nmol/L — ABNORMAL LOW (ref 19.3–76.4)
Testosterone, Free: 3.9 pg/mL — ABNORMAL LOW (ref 6.6–18.1)
Testosterone: 92 ng/dL — ABNORMAL LOW (ref 264–916)

## 2019-11-09 LAB — PSA: Prostate Specific Ag, Serum: 0.6 ng/mL (ref 0.0–4.0)

## 2019-11-09 NOTE — Telephone Encounter (Signed)
Prior Authorization initiated via CoverMyMeds for Testosterone Cypionate 200MG /ML intramuscular solutio Key: [id number]

## 2019-11-09 NOTE — Telephone Encounter (Signed)
PA approved.

## 2019-11-12 DIAGNOSIS — M545 Low back pain: Secondary | ICD-10-CM | POA: Diagnosis not present

## 2019-11-12 DIAGNOSIS — M5430 Sciatica, unspecified side: Secondary | ICD-10-CM | POA: Diagnosis not present

## 2019-11-16 ENCOUNTER — Encounter: Payer: Self-pay | Admitting: Family Medicine

## 2019-11-16 DIAGNOSIS — M5430 Sciatica, unspecified side: Secondary | ICD-10-CM | POA: Diagnosis not present

## 2019-11-16 DIAGNOSIS — M545 Low back pain: Secondary | ICD-10-CM | POA: Diagnosis not present

## 2019-11-18 ENCOUNTER — Encounter: Payer: Self-pay | Admitting: Family Medicine

## 2019-11-18 DIAGNOSIS — M5430 Sciatica, unspecified side: Secondary | ICD-10-CM | POA: Diagnosis not present

## 2019-11-18 DIAGNOSIS — M545 Low back pain: Secondary | ICD-10-CM | POA: Diagnosis not present

## 2019-11-23 ENCOUNTER — Telehealth: Payer: Self-pay | Admitting: Family Medicine

## 2019-11-23 ENCOUNTER — Encounter: Payer: Self-pay | Admitting: Family Medicine

## 2019-11-23 DIAGNOSIS — M5430 Sciatica, unspecified side: Secondary | ICD-10-CM | POA: Diagnosis not present

## 2019-11-23 DIAGNOSIS — M545 Low back pain: Secondary | ICD-10-CM | POA: Diagnosis not present

## 2019-11-23 NOTE — Telephone Encounter (Signed)
Routing to provider  

## 2019-11-23 NOTE — Telephone Encounter (Signed)
Pt was told to change dose and take 2 metFORMIN (GLUCOPHAGE-XR) 500 MG 24 hr tablets a day instead of 1 and now he has run out of his Rx sooner and the pharmacy will not refill it. Andrew [name] advise    Pt wanted to also advise that his blood sugar is still around 250-300  Pt was taken out of work in December and he talked to his disability insurance and they will be faxing information over today , they called and was advised that [name] was not a Pt / please advise

## 2019-11-23 NOTE — Telephone Encounter (Signed)
Forms printed, will place in your folder

## 2019-11-24 ENCOUNTER — Other Ambulatory Visit: Payer: Self-pay | Admitting: Nurse Practitioner

## 2019-11-24 MED ORDER — METFORMIN HCL ER 500 MG PO TB24: 500.0000 mg | ORAL_TABLET | Freq: Two times a day (BID) | ORAL | 2 refills | Status: DC

## 2019-11-24 NOTE — Telephone Encounter (Signed)
It looks like he was given 120 pills on his Rx- please check with pharmacy to see if he has more on file.   2. In terms of his disability- I'm not sure what they are talking about- he's been a patient for years. However, I told him I can keep him out for a few weeks, but if he needs to be out unitl April like he mentioned in his mychart message- that will need to come from his specialist.

## 2019-11-24 NOTE — Telephone Encounter (Signed)
Modified script sent in by Jolene.

## 2019-11-25 DIAGNOSIS — M545 Low back pain: Secondary | ICD-10-CM | POA: Diagnosis not present

## 2019-11-25 DIAGNOSIS — M5430 Sciatica, unspecified side: Secondary | ICD-10-CM | POA: Diagnosis not present

## 2019-11-27 DIAGNOSIS — M25511 Pain in right shoulder: Secondary | ICD-10-CM | POA: Diagnosis not present

## 2019-11-30 DIAGNOSIS — M545 Low back pain: Secondary | ICD-10-CM | POA: Diagnosis not present

## 2019-11-30 DIAGNOSIS — M5430 Sciatica, unspecified side: Secondary | ICD-10-CM | POA: Diagnosis not present

## 2019-12-03 ENCOUNTER — Telehealth: Payer: Self-pay

## 2019-12-03 NOTE — Telephone Encounter (Signed)
Copied from [place] [id number]. Topic: General - Other >> Nov 30, 2019  2:17 PM Virl Axe D wrote: Reason for CRM: Pt stated that [facility] faxed over request for office notes to accompany his disability paperwork. Paperwork was placed in Dr. Durenda Age box today. This [employer] Claim Number needs to be in the subject or heading [id number]. Pt would like a callback letting him know paperwork has been faxed. Please advise. >> Dec 01, 2019  4:53 PM Don Perking M wrote: This paperwork was put  In the folder for Dr Wynetta Emery to fill out.  >> Dec 01, 2019  1:07 PM Sheppard Coil, Luetta Nutting L wrote: Pt calling back about this.

## 2019-12-03 NOTE — Telephone Encounter (Signed)
Patient notified that we are waiting for a signature and that it will be faxed when completed.

## 2019-12-04 ENCOUNTER — Other Ambulatory Visit: Payer: Self-pay

## 2019-12-04 ENCOUNTER — Encounter: Payer: Self-pay | Admitting: Family Medicine

## 2019-12-04 ENCOUNTER — Ambulatory Visit (INDEPENDENT_AMBULATORY_CARE_PROVIDER_SITE_OTHER): Payer: BC Managed Care – PPO | Admitting: Family Medicine

## 2019-12-04 VITALS — BP 145/87 | HR 83 | Temp 98.2°F | Wt 260.0 lb

## 2019-12-04 DIAGNOSIS — E1165 Type 2 diabetes mellitus with hyperglycemia: Secondary | ICD-10-CM | POA: Diagnosis not present

## 2019-12-04 DIAGNOSIS — Z23 Encounter for immunization: Secondary | ICD-10-CM

## 2019-12-04 DIAGNOSIS — M25511 Pain in right shoulder: Secondary | ICD-10-CM | POA: Diagnosis not present

## 2019-12-04 NOTE — Patient Instructions (Signed)
Increase metformin to 2 in the AM and 1 in the PM for 1 week, then increase to 2 in the AM and 2 in the PM after that

## 2019-12-04 NOTE — Progress Notes (Signed)
BP (!) 145/87   Pulse 83   Temp 98.2 F (36.8 C)   Wt 260 lb (117.9 kg)   SpO2 96%   BMI 40.72 kg/m    Subjective:    Patient ID: Andrew [name], male    DOB: 08-28-1956, 64 y.o.   MRN: [id number]  HPI: Andrew [name] is a 64 y.o. male  Chief Complaint  Patient presents with  . Hypertension  . Diabetes    blurred vision    DIABETES- has been taking 2 of his metformin Hypoglycemic episodes:no Polydipsia/polyuria: no Visual disturbance: yes Chest pain: no Paresthesias: no Glucose Monitoring: yes  Accucheck frequency: Daily  Fasting glucose: 200-275 Taking Insulin?: no Blood Pressure Monitoring: not checking Retinal Examination: Not up to Date Foot Exam: Done today Diabetic Education: Not Completed Pneumovax: Given today Influenza: Up to Date Aspirin: yes  Relevant past medical, surgical, family and social history reviewed and updated as indicated. Interim medical history since our last visit reviewed. Allergies and medications reviewed and updated.  Review of Systems  Constitutional: Negative.   Respiratory: Negative.   Cardiovascular: Negative.   Gastrointestinal: Negative.   Musculoskeletal: Negative.   Neurological: Negative.   Psychiatric/Behavioral: Negative.     Per HPI unless specifically indicated above     Objective:    BP (!) 145/87   Pulse 83   Temp 98.2 F (36.8 C)   Wt 260 lb (117.9 kg)   SpO2 96%   BMI 40.72 kg/m   Wt Readings from Last 3 Encounters:  12/04/19 260 lb (117.9 kg)  10/08/19 270 lb (122.5 kg)  02/16/19 293 lb (132.9 kg)   No data found.  Physical Exam Vitals and nursing note reviewed.  Constitutional:      General: He is not in acute distress.    Appearance: Normal appearance. He is not ill-appearing, toxic-appearing or diaphoretic.  HENT:     Head: Normocephalic and atraumatic.     Right Ear: External ear normal.     Left Ear: External ear normal.     Nose: Nose normal.     Mouth/Throat:     Mouth: Mucous  membranes are moist.     Pharynx: Oropharynx is clear.  Eyes:     General: No scleral icterus.       Right eye: No discharge.        Left eye: No discharge.     Extraocular Movements: Extraocular movements intact.     Conjunctiva/sclera: Conjunctivae normal.     Pupils: Pupils are equal, round, and reactive to light.  Cardiovascular:     Rate and Rhythm: Normal rate and regular rhythm.     Pulses: Normal pulses.     Heart sounds: Normal heart sounds. No murmur. No friction rub. No gallop.   Pulmonary:     Effort: Pulmonary effort is normal. No respiratory distress.     Breath sounds: Normal breath sounds. No stridor. No wheezing, rhonchi or rales.  Chest:     Chest wall: No tenderness.  Musculoskeletal:        General: Normal range of motion.     Cervical back: Normal range of motion and neck supple.  Skin:    General: Skin is warm and dry.     Capillary Refill: Capillary refill takes less than 2 seconds.     Coloration: Skin is not jaundiced or pale.     Findings: No bruising, erythema, lesion or rash.  Neurological:     General: No focal deficit  present.     Mental Status: He is alert and oriented to person, place, and time. Mental status is at baseline.  Psychiatric:        Mood and Affect: Mood normal.        Behavior: Behavior normal.        Thought Content: Thought content normal.        Judgment: Judgment normal.     Results for orders placed or performed in visit on [id number]  Basic metabolic panel  Result Value Ref Range   Glucose 168 (H) 65 - 99 mg/dL   BUN 17 8 - 27 mg/dL   Creatinine, Ser 1.06 0.76 - 1.27 mg/dL   GFR calc non Af Amer 75 >59 mL/min/1.73   GFR calc Af Amer 87 >59 mL/min/1.73   BUN/Creatinine Ratio 16 10 - 24   Sodium 137 134 - 144 mmol/L   Potassium 4.2 3.5 - 5.2 mmol/L   Chloride 100 96 - 106 mmol/L   CO2 20 20 - 29 mmol/L   Calcium 8.9 8.6 - 10.2 mg/dL      Assessment & Plan:   Problem List Items Addressed This Visit      Endocrine     Uncontrolled type 2 diabetes mellitus with hyperglycemia (HCC) - Primary    Tolerating the metformin well. Will increase medication and recheck 1 month. Call with any concerns. Continue to monitor.       Relevant Orders   Basic metabolic panel (Completed)       Follow up plan: Return in about 4 weeks (around 01/01/2020).

## 2019-12-05 ENCOUNTER — Encounter: Payer: Self-pay | Admitting: Family Medicine

## 2019-12-05 LAB — BASIC METABOLIC PANEL
BUN/Creatinine Ratio: 16 (ref 10–24)
BUN: 17 mg/dL (ref 8–27)
CO2: 20 mmol/L (ref 20–29)
Calcium: 8.9 mg/dL (ref 8.6–10.2)
Chloride: 100 mmol/L (ref 96–106)
Creatinine, Ser: 1.06 mg/dL (ref 0.76–1.27)
GFR calc Af Amer: 87 mL/min/{1.73_m2} (ref >59)
GFR calc non Af Amer: 75 mL/min/{1.73_m2} (ref >59)
Glucose: 168 mg/dL — ABNORMAL HIGH (ref 65–99)
Potassium: 4.2 mmol/L (ref 3.5–5.2)
Sodium: 137 mmol/L (ref 134–144)

## 2019-12-05 NOTE — Assessment & Plan Note (Signed)
Tolerating the metformin well. Will increase medication and recheck 1 month. Call with any concerns. Continue to monitor.

## 2019-12-08 NOTE — Telephone Encounter (Signed)
Patient notified that paperwork has been faxed.

## 2019-12-08 NOTE — Telephone Encounter (Signed)
Pt calling to check on this paperwork. Please advise.

## 2019-12-09 DIAGNOSIS — M545 Low back pain: Secondary | ICD-10-CM | POA: Diagnosis not present

## 2019-12-09 DIAGNOSIS — M5430 Sciatica, unspecified side: Secondary | ICD-10-CM | POA: Diagnosis not present

## 2019-12-11 DIAGNOSIS — M5430 Sciatica, unspecified side: Secondary | ICD-10-CM | POA: Diagnosis not present

## 2019-12-11 DIAGNOSIS — M545 Low back pain: Secondary | ICD-10-CM | POA: Diagnosis not present

## 2019-12-12 ENCOUNTER — Other Ambulatory Visit: Payer: Self-pay | Admitting: Family Medicine

## 2019-12-12 NOTE — Telephone Encounter (Signed)
Requested medication (s) are due for refill today:yes  Requested medication (s) are on the active medication list: Yes  Last refill:  05/27/19  Future visit scheduled: no  Notes to clinic:  medication not delegated to NT to refill   Requested Prescriptions  Pending Prescriptions Disp Refills   testosterone cypionate (DEPOTESTOSTERONE CYPIONATE) 200 MG/ML injection [Pharmacy Med Name: TESTOSTERONE CYP 200MG /ML SDV 1ML] 2 mL     Sig: ADMINISTER 1 ML(200 MG) IN THE MUSCLE EVERY 14 DAYS      Off-Protocol Failed - 12/12/2019  8:30 AM      Failed - Medication not assigned to a protocol, review manually.      Passed - Valid encounter within last 12 months    Recent Outpatient Visits           1 week ago Uncontrolled type 2 diabetes mellitus with hyperglycemia ([place])   [place], Megan P, DO   1 month ago Uncontrolled type 2 diabetes mellitus with hyperglycemia ([place])   [place], Megan P, DO   2 months ago Osteoarthritis of spine with radiculopathy, cervical region   [facility], [place], DO   2 months ago Acute pain of right shoulder   [place], DO   7 months ago Routine general medical examination at a health care facility   [facility], [place], [place]

## 2019-12-14 DIAGNOSIS — M25511 Pain in right shoulder: Secondary | ICD-10-CM | POA: Diagnosis not present

## 2019-12-15 ENCOUNTER — Encounter: Payer: Self-pay | Admitting: Family Medicine

## 2019-12-15 DIAGNOSIS — M545 Low back pain: Secondary | ICD-10-CM | POA: Diagnosis not present

## 2019-12-15 DIAGNOSIS — M5430 Sciatica, unspecified side: Secondary | ICD-10-CM | POA: Diagnosis not present

## 2019-12-15 DIAGNOSIS — M7541 Impingement syndrome of right shoulder: Secondary | ICD-10-CM | POA: Insufficient documentation

## 2019-12-15 MED ORDER — METFORMIN HCL ER 500 MG PO TB24: 1000.0000 mg | ORAL_TABLET | Freq: Two times a day (BID) | ORAL | 2 refills | Status: DC

## 2019-12-18 ENCOUNTER — Telehealth: Payer: Self-pay

## 2019-12-18 DIAGNOSIS — M5430 Sciatica, unspecified side: Secondary | ICD-10-CM | POA: Diagnosis not present

## 2019-12-18 DIAGNOSIS — M545 Low back pain: Secondary | ICD-10-CM | POA: Diagnosis not present

## 2019-12-18 NOTE — Telephone Encounter (Signed)
Tried to call patient unable to leave a message, will try again.   Copied from [place] [phone]. Topic: General - Other >> Dec 18, 2019 12:07 PM [name] wrote: Patient requesting to speak with Celestine Bougie. Patient would disclose no additional information other than it is regarding diabetes medication.

## 2019-12-21 ENCOUNTER — Telehealth: Payer: Self-pay

## 2019-12-21 DIAGNOSIS — M545 Low back pain: Secondary | ICD-10-CM | POA: Diagnosis not present

## 2019-12-21 DIAGNOSIS — M5430 Sciatica, unspecified side: Secondary | ICD-10-CM | POA: Diagnosis not present

## 2019-12-21 MED ORDER — METFORMIN HCL ER 500 MG PO TB24: 1000.0000 mg | ORAL_TABLET | Freq: Two times a day (BID) | ORAL | 1 refills | Status: DC

## 2019-12-21 NOTE — Telephone Encounter (Signed)
Patient had a 30 day supply of Metformin filled at [facility], he needs the 90 day supply sent to [facility]

## 2019-12-21 NOTE — Telephone Encounter (Signed)
Patient states that the medication issue was fixed.

## 2019-12-23 DIAGNOSIS — M5430 Sciatica, unspecified side: Secondary | ICD-10-CM | POA: Diagnosis not present

## 2019-12-23 DIAGNOSIS — M545 Low back pain: Secondary | ICD-10-CM | POA: Diagnosis not present

## 2019-12-28 DIAGNOSIS — M5430 Sciatica, unspecified side: Secondary | ICD-10-CM | POA: Diagnosis not present

## 2019-12-28 DIAGNOSIS — M545 Low back pain: Secondary | ICD-10-CM | POA: Diagnosis not present

## 2019-12-31 DIAGNOSIS — M545 Low back pain: Secondary | ICD-10-CM | POA: Diagnosis not present

## 2019-12-31 DIAGNOSIS — M5430 Sciatica, unspecified side: Secondary | ICD-10-CM | POA: Diagnosis not present

## 2020-01-04 DIAGNOSIS — M545 Low back pain: Secondary | ICD-10-CM | POA: Diagnosis not present

## 2020-01-04 DIAGNOSIS — M5430 Sciatica, unspecified side: Secondary | ICD-10-CM | POA: Diagnosis not present

## 2020-01-06 DIAGNOSIS — M5430 Sciatica, unspecified side: Secondary | ICD-10-CM | POA: Diagnosis not present

## 2020-01-06 DIAGNOSIS — M545 Low back pain: Secondary | ICD-10-CM | POA: Diagnosis not present

## 2020-01-22 ENCOUNTER — Other Ambulatory Visit: Payer: Self-pay | Admitting: Family Medicine

## 2020-01-22 DIAGNOSIS — M65811 Other synovitis and tenosynovitis, right shoulder: Secondary | ICD-10-CM | POA: Diagnosis not present

## 2020-01-22 DIAGNOSIS — M75111 Incomplete rotator cuff tear or rupture of right shoulder, not specified as traumatic: Secondary | ICD-10-CM | POA: Diagnosis not present

## 2020-01-22 DIAGNOSIS — I879 Disorder of vein, unspecified: Secondary | ICD-10-CM | POA: Diagnosis not present

## 2020-01-22 DIAGNOSIS — M19011 Primary osteoarthritis, right shoulder: Secondary | ICD-10-CM | POA: Diagnosis not present

## 2020-01-22 DIAGNOSIS — M659 Synovitis and tenosynovitis, unspecified: Secondary | ICD-10-CM | POA: Diagnosis not present

## 2020-01-22 DIAGNOSIS — S46211A Strain of muscle, fascia and tendon of other parts of biceps, right arm, initial encounter: Secondary | ICD-10-CM | POA: Diagnosis not present

## 2020-01-22 DIAGNOSIS — I890 Lymphedema, not elsewhere classified: Secondary | ICD-10-CM | POA: Diagnosis not present

## 2020-01-22 DIAGNOSIS — M7541 Impingement syndrome of right shoulder: Secondary | ICD-10-CM | POA: Diagnosis not present

## 2020-01-22 DIAGNOSIS — M24111 Other articular cartilage disorders, right shoulder: Secondary | ICD-10-CM | POA: Diagnosis not present

## 2020-01-22 DIAGNOSIS — M25511 Pain in right shoulder: Secondary | ICD-10-CM | POA: Diagnosis not present

## 2020-01-22 DIAGNOSIS — Z4889 Encounter for other specified surgical aftercare: Secondary | ICD-10-CM | POA: Diagnosis not present

## 2020-01-22 DIAGNOSIS — S43431A Superior glenoid labrum lesion of right shoulder, initial encounter: Secondary | ICD-10-CM | POA: Diagnosis not present

## 2020-01-22 DIAGNOSIS — G8918 Other acute postprocedural pain: Secondary | ICD-10-CM | POA: Diagnosis not present

## 2020-01-22 NOTE — Telephone Encounter (Signed)
Unable to Lvm to make apt for medication refills.

## 2020-01-22 NOTE — Telephone Encounter (Signed)
Requested medication (s) are due for refill today: yes  Requested medication (s) are on the active medication list: yes  Last refill: 12/22/2019  Future visit scheduled: no  Notes to clinic:  this refill cannot be delegated    Requested Prescriptions  Pending Prescriptions Disp Refills   traMADol (ULTRAM) 50 MG tablet [Pharmacy Med Name: TRAMADOL 50MG  TABLETS] 90 tablet     Sig: TAKE 2 TABLETS BY MOUTH EVERY MORNING AND 1 TABLET EVERY EVENING      Not Delegated - Analgesics:  Opioid Agonists Failed - 01/22/2020  1:18 PM      Failed - This refill cannot be delegated      Failed - Urine Drug Screen completed in last 360 days.      Passed - Valid encounter within last 6 months    Recent Outpatient Visits           1 month ago Uncontrolled type 2 diabetes mellitus with hyperglycemia ([place])   [place], Megan P, DO   2 months ago Uncontrolled type 2 diabetes mellitus with hyperglycemia ([place])   [place], Megan P, DO   3 months ago Osteoarthritis of spine with radiculopathy, cervical region   [facility], Megan P, DO   4 months ago Acute pain of right shoulder   [place], DO   8 months ago Routine general medical examination at a health care facility   [facility], [place], DO

## 2020-01-22 NOTE — Telephone Encounter (Signed)
Needs appointment

## 2020-01-25 NOTE — Telephone Encounter (Signed)
Pt has apt on 02/05/2020.

## 2020-02-02 DIAGNOSIS — M25511 Pain in right shoulder: Secondary | ICD-10-CM | POA: Diagnosis not present

## 2020-02-02 DIAGNOSIS — I9789 Other postprocedural complications and disorders of the circulatory system, not elsewhere classified: Secondary | ICD-10-CM | POA: Diagnosis not present

## 2020-02-02 DIAGNOSIS — I890 Lymphedema, not elsewhere classified: Secondary | ICD-10-CM | POA: Diagnosis not present

## 2020-02-02 DIAGNOSIS — Z4889 Encounter for other specified surgical aftercare: Secondary | ICD-10-CM | POA: Diagnosis not present

## 2020-02-05 ENCOUNTER — Encounter: Payer: Self-pay | Admitting: Family Medicine

## 2020-02-05 ENCOUNTER — Telehealth: Payer: Self-pay | Admitting: Family Medicine

## 2020-02-05 ENCOUNTER — Telehealth (INDEPENDENT_AMBULATORY_CARE_PROVIDER_SITE_OTHER): Payer: BC Managed Care – PPO | Admitting: Family Medicine

## 2020-02-05 VITALS — BP 132/90 | HR 71

## 2020-02-05 DIAGNOSIS — M4726 Other spondylosis with radiculopathy, lumbar region: Secondary | ICD-10-CM

## 2020-02-05 DIAGNOSIS — M75111 Incomplete rotator cuff tear or rupture of right shoulder, not specified as traumatic: Secondary | ICD-10-CM | POA: Diagnosis not present

## 2020-02-05 DIAGNOSIS — M25511 Pain in right shoulder: Secondary | ICD-10-CM | POA: Diagnosis not present

## 2020-02-05 DIAGNOSIS — N529 Male erectile dysfunction, unspecified: Secondary | ICD-10-CM | POA: Diagnosis not present

## 2020-02-05 DIAGNOSIS — E1165 Type 2 diabetes mellitus with hyperglycemia: Secondary | ICD-10-CM | POA: Diagnosis not present

## 2020-02-05 MED ORDER — OMEPRAZOLE 40 MG PO CPDR: 40.0000 mg | DELAYED_RELEASE_CAPSULE | Freq: Every day | ORAL | 3 refills | Status: DC

## 2020-02-05 MED ORDER — TRAMADOL HCL 50 MG PO TABS: ORAL_TABLET | ORAL | 2 refills | Status: DC

## 2020-02-05 MED ORDER — SILDENAFIL CITRATE 100 MG PO TABS: ORAL_TABLET | ORAL | 12 refills | Status: DC

## 2020-02-05 NOTE — Assessment & Plan Note (Signed)
Under good control on current regimen. Continue current regimen. Continue to monitor. Call with any concerns. Refills given.   

## 2020-02-05 NOTE — Progress Notes (Signed)
BP 132/90   Pulse 71    Subjective:    Patient ID: Andrew [name], male    DOB: October 11, 1955, 64 y.o.   MRN: [id number]  HPI: Andrew [name] is a 64 y.o. male  Chief Complaint  Patient presents with  . Pain  . Erectile Dysfunction   Had rotator cuff surgery about 2 weeks ago. Has been feeling well with that and recovering well.   Viagra works well, just needs a refill.   CHRONIC PAIN  Present dose: 15 Morphine equivalents Pain control status: controlled Duration: chronic Location: low back and shoulder Quality: aching and sore Current Pain Level: moderate Previous Pain Level: severe Breakthrough pain: no Benefit from narcotic medications: yes What Activities task can be accomplished with current medication? Able to do his ADLs Interested in weaning off narcotics:no   Stool softners/OTC fiber: no  Previous pain specialty evaluation: no Non-narcotic analgesic meds: yes Narcotic contract: yes  DIABETES Hypoglycemic episodes:no Polydipsia/polyuria: no Visual disturbance: no Chest pain: no Paresthesias: no Glucose Monitoring: yes  Accucheck frequency: Daily  Fasting glucose: 172 Taking Insulin?: no Blood Pressure Monitoring: not checking Retinal Examination: Not up to Date Foot Exam: Up to Date Diabetic Education: Completed Pneumovax: Up to Date Influenza: Up to Date Aspirin: no  Relevant past medical, surgical, family and social history reviewed and updated as indicated. Interim medical history since our last visit reviewed. Allergies and medications reviewed and updated.  Review of Systems  Constitutional: Negative.   Respiratory: Negative.   Cardiovascular: Negative.   Gastrointestinal: Negative.   Musculoskeletal: Positive for arthralgias, back pain and myalgias. Negative for gait problem, joint swelling, neck pain and neck stiffness.  Neurological: Negative.   Psychiatric/Behavioral: Negative.     Per HPI unless specifically indicated above       Objective:    BP 132/90   Pulse 71   Wt Readings from Last 3 Encounters:  12/04/19 260 lb (117.9 kg)  10/08/19 270 lb (122.5 kg)  02/16/19 293 lb (132.9 kg)    Physical Exam Vitals and nursing note reviewed.  Constitutional:      General: He is not in acute distress.    Appearance: Normal appearance. He is not ill-appearing, toxic-appearing or diaphoretic.  HENT:     Head: Normocephalic and atraumatic.     Right Ear: External ear normal.     Left Ear: External ear normal.     Nose: Nose normal.     Mouth/Throat:     Mouth: Mucous membranes are moist.     Pharynx: Oropharynx is clear.  Eyes:     General: No scleral icterus.       Right eye: No discharge.        Left eye: No discharge.     Conjunctiva/sclera: Conjunctivae normal.     Pupils: Pupils are equal, round, and reactive to light.  Pulmonary:     Effort: Pulmonary effort is normal. No respiratory distress.     Comments: Speaking in full sentences Musculoskeletal:        General: Normal range of motion.     Cervical back: Normal range of motion.  Skin:    Coloration: Skin is not jaundiced or pale.     Findings: No bruising, erythema, lesion or rash.  Neurological:     Mental Status: He is alert and oriented to person, place, and time. Mental status is at baseline.  Psychiatric:        Mood and Affect: Mood normal.  Behavior: Behavior normal.        Thought Content: Thought content normal.        Judgment: Judgment normal.     Results for orders placed or performed in visit on [id number]  Basic metabolic panel  Result Value Ref Range   Glucose 168 (H) 65 - 99 mg/dL   BUN 17 8 - 27 mg/dL   Creatinine, Ser 1.06 0.76 - 1.27 mg/dL   GFR calc non Af Amer 75 >59 mL/min/1.73   GFR calc Af Amer 87 >59 mL/min/1.73   BUN/Creatinine Ratio 16 10 - 24   Sodium 137 134 - 144 mmol/L   Potassium 4.2 3.5 - 5.2 mmol/L   Chloride 100 96 - 106 mmol/L   CO2 20 20 - 29 mmol/L   Calcium 8.9 8.6 - 10.2 mg/dL       Assessment & Plan:   Problem List Items Addressed This Visit      Endocrine   Uncontrolled type 2 diabetes mellitus with hyperglycemia (HCC) - Primary    Due for recheck on his A1c- will get him in shortly. Call with any concerns. Adjust medication if needed.       Relevant Orders   Bayer DCA Hb A1c Waived     Musculoskeletal and Integument   Degenerative joint disease (DJD) of lumbar spine    Under good control on current regimen. Continue current regimen. Continue to monitor. Call with any concerns. Refills given for 3 months. Follow up 3 months.        Relevant Medications   traMADol (ULTRAM) 50 MG tablet     Other   ED (erectile dysfunction)    Under good control on current regimen. Continue current regimen. Continue to monitor. Call with any concerns. Refills given.            Follow up plan: Return in about 3 months (around 05/07/2020) for physical.   . This visit was completed via MyChart due to the restrictions of the COVID-19 pandemic. All issues as above were discussed and addressed. Physical exam was done as above through visual confirmation on MyChart. If it was felt that the patient should be evaluated in the office, they were directed there. The patient verbally consented to this visit. . Location of the patient: home . Location of the provider: work . Those involved with this call:  . Provider: Park Liter, DO . CMA: Tiffany Reel, CMA . Front Desk/Registration: Don Perking  . Time spent on call: 25 minutes with patient face to face via video conference. More than 50% of this time was spent in counseling and coordination of care. 40 minutes total spent in review of patient's record and preparation of their chart.

## 2020-02-05 NOTE — Assessment & Plan Note (Signed)
Under good control on current regimen. Continue current regimen. Continue to monitor. Call with any concerns. Refills given for 3 months. Follow up 3 months.    

## 2020-02-05 NOTE — Assessment & Plan Note (Signed)
Due for recheck on his A1c- will get him in shortly. Call with any concerns. Adjust medication if needed.

## 2020-02-05 NOTE — Telephone Encounter (Signed)
-----   Message from Valerie Roys, DO sent at 02/05/2020 10:54 AM EDT ----- 3 months for physical

## 2020-02-05 NOTE — Telephone Encounter (Signed)
lvm to make this 3 month physical . Sent letter.

## 2020-02-16 NOTE — Telephone Encounter (Signed)
Unable to lvm to make this apt/  

## 2020-02-26 DIAGNOSIS — M25511 Pain in right shoulder: Secondary | ICD-10-CM | POA: Diagnosis not present

## 2020-02-26 DIAGNOSIS — M75111 Incomplete rotator cuff tear or rupture of right shoulder, not specified as traumatic: Secondary | ICD-10-CM | POA: Diagnosis not present

## 2020-03-03 DIAGNOSIS — M75111 Incomplete rotator cuff tear or rupture of right shoulder, not specified as traumatic: Secondary | ICD-10-CM | POA: Diagnosis not present

## 2020-03-03 DIAGNOSIS — M25511 Pain in right shoulder: Secondary | ICD-10-CM | POA: Diagnosis not present

## 2020-03-08 DIAGNOSIS — M25511 Pain in right shoulder: Secondary | ICD-10-CM | POA: Diagnosis not present

## 2020-03-08 DIAGNOSIS — M75111 Incomplete rotator cuff tear or rupture of right shoulder, not specified as traumatic: Secondary | ICD-10-CM | POA: Diagnosis not present

## 2020-03-18 DIAGNOSIS — M75111 Incomplete rotator cuff tear or rupture of right shoulder, not specified as traumatic: Secondary | ICD-10-CM | POA: Diagnosis not present

## 2020-03-18 DIAGNOSIS — M25511 Pain in right shoulder: Secondary | ICD-10-CM | POA: Diagnosis not present

## 2020-03-21 DIAGNOSIS — M25511 Pain in right shoulder: Secondary | ICD-10-CM | POA: Diagnosis not present

## 2020-03-21 DIAGNOSIS — M75111 Incomplete rotator cuff tear or rupture of right shoulder, not specified as traumatic: Secondary | ICD-10-CM | POA: Diagnosis not present

## 2020-04-05 DIAGNOSIS — M75111 Incomplete rotator cuff tear or rupture of right shoulder, not specified as traumatic: Secondary | ICD-10-CM | POA: Diagnosis not present

## 2020-04-05 DIAGNOSIS — M25511 Pain in right shoulder: Secondary | ICD-10-CM | POA: Diagnosis not present

## 2020-04-08 DIAGNOSIS — M75111 Incomplete rotator cuff tear or rupture of right shoulder, not specified as traumatic: Secondary | ICD-10-CM | POA: Diagnosis not present

## 2020-04-08 DIAGNOSIS — M25511 Pain in right shoulder: Secondary | ICD-10-CM | POA: Diagnosis not present

## 2020-04-12 DIAGNOSIS — M25511 Pain in right shoulder: Secondary | ICD-10-CM | POA: Diagnosis not present

## 2020-04-12 DIAGNOSIS — M75111 Incomplete rotator cuff tear or rupture of right shoulder, not specified as traumatic: Secondary | ICD-10-CM | POA: Diagnosis not present

## 2020-04-15 DIAGNOSIS — M75111 Incomplete rotator cuff tear or rupture of right shoulder, not specified as traumatic: Secondary | ICD-10-CM | POA: Diagnosis not present

## 2020-04-15 DIAGNOSIS — M25511 Pain in right shoulder: Secondary | ICD-10-CM | POA: Diagnosis not present

## 2020-04-19 DIAGNOSIS — M25511 Pain in right shoulder: Secondary | ICD-10-CM | POA: Diagnosis not present

## 2020-04-19 DIAGNOSIS — M75111 Incomplete rotator cuff tear or rupture of right shoulder, not specified as traumatic: Secondary | ICD-10-CM | POA: Diagnosis not present

## 2020-04-26 ENCOUNTER — Other Ambulatory Visit: Payer: Self-pay | Admitting: Family Medicine

## 2020-05-19 DIAGNOSIS — M75111 Incomplete rotator cuff tear or rupture of right shoulder, not specified as traumatic: Secondary | ICD-10-CM | POA: Diagnosis not present

## 2020-05-19 DIAGNOSIS — M25511 Pain in right shoulder: Secondary | ICD-10-CM | POA: Diagnosis not present

## 2020-05-25 DIAGNOSIS — M25511 Pain in right shoulder: Secondary | ICD-10-CM | POA: Diagnosis not present

## 2020-05-25 DIAGNOSIS — M75111 Incomplete rotator cuff tear or rupture of right shoulder, not specified as traumatic: Secondary | ICD-10-CM | POA: Diagnosis not present

## 2020-05-27 DIAGNOSIS — M25511 Pain in right shoulder: Secondary | ICD-10-CM | POA: Diagnosis not present

## 2020-05-27 DIAGNOSIS — M75111 Incomplete rotator cuff tear or rupture of right shoulder, not specified as traumatic: Secondary | ICD-10-CM | POA: Diagnosis not present

## 2020-05-30 ENCOUNTER — Ambulatory Visit (INDEPENDENT_AMBULATORY_CARE_PROVIDER_SITE_OTHER): Payer: BC Managed Care – PPO | Admitting: Family Medicine

## 2020-05-30 ENCOUNTER — Other Ambulatory Visit: Payer: Self-pay

## 2020-05-30 ENCOUNTER — Encounter: Payer: Self-pay | Admitting: Family Medicine

## 2020-05-30 VITALS — BP 182/96 | HR 79 | Temp 98.4°F | Ht 68.0 in | Wt 266.4 lb

## 2020-05-30 DIAGNOSIS — Z0000 Encounter for general adult medical examination without abnormal findings: Secondary | ICD-10-CM | POA: Diagnosis not present

## 2020-05-30 DIAGNOSIS — Z8709 Personal history of other diseases of the respiratory system: Secondary | ICD-10-CM

## 2020-05-30 DIAGNOSIS — I129 Hypertensive chronic kidney disease with stage 1 through stage 4 chronic kidney disease, or unspecified chronic kidney disease: Secondary | ICD-10-CM | POA: Diagnosis not present

## 2020-05-30 DIAGNOSIS — E1165 Type 2 diabetes mellitus with hyperglycemia: Secondary | ICD-10-CM

## 2020-05-30 DIAGNOSIS — E291 Testicular hypofunction: Secondary | ICD-10-CM | POA: Diagnosis not present

## 2020-05-30 DIAGNOSIS — M4726 Other spondylosis with radiculopathy, lumbar region: Secondary | ICD-10-CM

## 2020-05-30 DIAGNOSIS — Z6841 Body Mass Index (BMI) 40.0 and over, adult: Secondary | ICD-10-CM

## 2020-05-30 DIAGNOSIS — L918 Other hypertrophic disorders of the skin: Secondary | ICD-10-CM

## 2020-05-30 LAB — MICROSCOPIC EXAMINATION
Bacteria, UA: NONE SEEN
WBC, UA: NONE SEEN /hpf (ref 0–5)

## 2020-05-30 LAB — URINALYSIS, ROUTINE W REFLEX MICROSCOPIC
Bilirubin, UA: NEGATIVE
Leukocytes,UA: NEGATIVE
Nitrite, UA: NEGATIVE
RBC, UA: NEGATIVE
Specific Gravity, UA: >1.03 — ABNORMAL HIGH (ref 1.005–1.030)
Urobilinogen, Ur: 1 mg/dL (ref 0.2–1.0)
pH, UA: 5 (ref 5.0–7.5)

## 2020-05-30 LAB — MICROALBUMIN, URINE WAIVED
Creatinine, Urine Waived: 300 mg/dL (ref 10–300)
Microalb, Ur Waived: 150 mg/L — ABNORMAL HIGH (ref 0–19)

## 2020-05-30 LAB — BAYER DCA HB A1C WAIVED: HB A1C (BAYER DCA - WAIVED): 5.9 % (ref <7.0)

## 2020-05-30 MED ORDER — METFORMIN HCL ER 500 MG PO TB24: 1000.0000 mg | ORAL_TABLET | Freq: Two times a day (BID) | ORAL | 1 refills | Status: DC

## 2020-05-30 MED ORDER — BUPROPION HCL ER (SR) 150 MG PO TB12: 150.0000 mg | ORAL_TABLET | Freq: Two times a day (BID) | ORAL | 1 refills | Status: DC

## 2020-05-30 MED ORDER — DICLOFENAC SODIUM 75 MG PO TBEC: 75.0000 mg | DELAYED_RELEASE_TABLET | Freq: Two times a day (BID) | ORAL | 3 refills | Status: DC

## 2020-05-30 MED ORDER — TRAMADOL HCL 50 MG PO TABS: ORAL_TABLET | ORAL | 2 refills | Status: DC

## 2020-05-30 MED ORDER — BENAZEPRIL HCL 40 MG PO TABS: 40.0000 mg | ORAL_TABLET | Freq: Every day | ORAL | 1 refills | Status: DC

## 2020-05-30 MED ORDER — HYDROCHLOROTHIAZIDE 12.5 MG PO CAPS: 12.5000 mg | ORAL_CAPSULE | Freq: Every day | ORAL | 1 refills | Status: DC

## 2020-05-30 MED ORDER — GABAPENTIN 300 MG PO CAPS: 600.0000 mg | ORAL_CAPSULE | Freq: Two times a day (BID) | ORAL | 1 refills | Status: DC

## 2020-05-30 MED ORDER — METOPROLOL SUCCINATE ER 100 MG PO TB24: 100.0000 mg | ORAL_TABLET | Freq: Every day | ORAL | 1 refills | Status: DC

## 2020-05-30 MED ORDER — TESTOSTERONE CYPIONATE 200 MG/ML IM SOLN: INTRAMUSCULAR | 5 refills | Status: DC

## 2020-05-30 MED ORDER — OMEPRAZOLE 40 MG PO CPDR: 40.0000 mg | DELAYED_RELEASE_CAPSULE | Freq: Every day | ORAL | 3 refills | Status: DC

## 2020-05-30 NOTE — Assessment & Plan Note (Signed)
Under good control on current regimen. Continue current regimen. Continue to monitor. Call with any concerns. Refills given. Labs drawn today.   

## 2020-05-30 NOTE — Assessment & Plan Note (Signed)
Not under good control. Did not take his medicine today. Encouraged him to take his medicine. Call with any concerns. Continue to monitor.

## 2020-05-30 NOTE — Progress Notes (Signed)
BP (!) 182/96 (BP Location: Left Arm, Cuff Size: Normal)   Pulse 79   Temp 98.4 F (36.9 C) (Oral)   Ht 5\' 8"  (1.727 m)   Wt 266 lb 6.4 oz (120.8 kg)   SpO2 95%   BMI 40.51 kg/m    Subjective:    Patient ID: Andrew [name], male    DOB: Dec 17, 1955, 64 y.o.   MRN: [id number]  HPI: Andrew [name] is a 64 y.o. male presenting on 05/30/2020 for comprehensive medical examination. Current medical complaints include:  DIABETES Hypoglycemic episodes:no Polydipsia/polyuria: no Visual disturbance: no Chest pain: no Paresthesias: no Glucose Monitoring: yes  Accucheck frequency: occasionally Taking Insulin?: no Blood Pressure Monitoring: not checking Retinal Examination: Not up to Date Foot Exam: Up to Date Diabetic Education: Completed Pneumovax: Up to Date Influenza: Not up to Date Aspirin: no  CHRONIC PAIN  Present dose: 15 Morphine equivalents Pain control status: stable Duration: chronic Location: low back and shoulder Quality: aching and sore Current Pain Level: mild Previous Pain Level: severe Breakthrough pain: no Benefit from narcotic medications: yes What Activities task can be accomplished with current medication? Able to do his ADLs Interested in weaning off narcotics:no   Stool softners/OTC fiber: no  Previous pain specialty evaluation: no Non-narcotic analgesic meds: yes Narcotic contract: yes  HYPERTENSION- did not take any medicine today Hypertension status: uncontrolled  Satisfied with current treatment? yes Duration of hypertension: chronic BP monitoring frequency:  not checking BP medication side effects:  no Medication compliance: excellent compliance Aspirin: no Recurrent headaches: no Visual changes: no Palpitations: no Dyspnea: no Chest pain: no Lower extremity edema: no Dizzy/lightheaded: no  He currently lives with: wife Interim Problems from his last visit: no  Depression Screen done today and results listed below:  Depression  screen [facility] 2/9 05/30/2020 05/01/2019 03/28/2018 12/16/2017 02/22/2017  Decreased Interest 1 1 0 0 0  Down, Depressed, Hopeless 0 1 0 0 0  PHQ - 2 Score 1 2 0 0 0  Altered sleeping 1 0 - 0 0  Tired, decreased energy 0 0 - 0 0  Change in appetite 0 0 - 0 0  Feeling bad or failure about yourself  0 0 - 0 0  Trouble concentrating 0 0 - 0 0  Moving slowly or fidgety/restless 0 0 - 0 0  Suicidal thoughts 0 0 - 0 0  PHQ-9 Score 2 2 - 0 0  Difficult doing work/chores Not difficult at all Not difficult at all Not difficult at all - -    Past Medical History:  Past Medical History:  Diagnosis Date  . Chronic pain syndrome   . Depression   . Diverticulosis   . DVT (deep venous thrombosis) (HCC)    bilateral, was on anticoags for 10months  . Ear infection 08/2016   CLEARING UP-FINISHED ANTIBIOTIC AND PREDNISONE TAPER   . Family history of adverse reaction to anesthesia    BROTHER-N/V  . Hernia, umbilical   . History of kidney stones   . Hyperlipidemia   . Hypertension   . IFG (impaired fasting glucose)   . Knee pain, chronic   . Pes anserine bursitis     Surgical History:  Past Surgical History:  Procedure Laterality Date  . COLONOSCOPY  2015  . JOINT REPLACEMENT Bilateral 2009   replacement and revision  . KIDNEY STONE SURGERY    . SHOULDER ARTHROSCOPY WITH ROTATOR CUFF REPAIR Right   . UMBILICAL HERNIA REPAIR N/A 09/06/2016   Procedure: HERNIA  REPAIR UMBILICAL ADULT;  Surgeon: Robert Bellow, MD;  Location: [facility] ORS;  Service: General;  Laterality: N/A;    Medications:  Current Outpatient Medications on File Prior to Visit  Medication Sig  . sildenafil (VIAGRA) 100 MG tablet TAKE 1/2 TO 1 TABLET BY  MOUTH DAILY AS NEEDED FOR  ERECTILE DYSFUNCTION (Patient not taking: Reported on 05/30/2020)   No current facility-administered medications on file prior to visit.    Allergies:  No Known Allergies  Social History:  Social History   Socioeconomic History  . Marital status:  Married    Spouse name: Not on file  . Number of children: Not on file  . Years of education: Not on file  . Highest education level: Not on file  Occupational History  . Not on file  Tobacco Use  . Smoking status: Former Smoker    Packs/day: 1.50    Years: 25.00    Pack years: 37.50    Types: Cigarettes    Quit date: 06/28/2011    Years since quitting: 8.9  . Smokeless tobacco: Never Used  Vaping Use  . Vaping Use: Never used  Substance and Sexual Activity  . Alcohol use: Yes    Comment: on occasion  . Drug use: No  . Sexual activity: Yes    Birth control/protection: None  Other Topics Concern  . Not on file  Social History Narrative  . Not on file   Social Determinants of Health   Financial Resource Strain:   . Difficulty of Paying Living Expenses: Not on file  Food Insecurity:   . Worried About Charity fundraiser in the Last Year: Not on file  . Ran Out of Food in the Last Year: Not on file  Transportation Needs:   . Lack of Transportation (Medical): Not on file  . Lack of Transportation (Non-Medical): Not on file  Physical Activity:   . Days of Exercise per Week: Not on file  . Minutes of Exercise per Session: Not on file  Stress:   . Feeling of Stress : Not on file  Social Connections:   . Frequency of Communication with Friends and Family: Not on file  . Frequency of Social Gatherings with Friends and Family: Not on file  . Attends Religious Services: Not on file  . Active Member of Clubs or Organizations: Not on file  . Attends Archivist Meetings: Not on file  . Marital Status: Not on file  Intimate Partner Violence:   . Fear of Current or Ex-Partner: Not on file  . Emotionally Abused: Not on file  . Physically Abused: Not on file  . Sexually Abused: Not on file   Social History   Tobacco Use  Smoking Status Former Smoker  . Packs/day: 1.50  . Years: 25.00  . Pack years: 37.50  . Types: Cigarettes  . Quit date: 06/28/2011  . Years  since quitting: 8.9  Smokeless Tobacco Never Used   Social History   Substance and Sexual Activity  Alcohol Use Yes   Comment: on occasion    Family History:  Family History  Problem Relation Age of Onset  . Alcohol abuse Father   . Hypertension Father   . Cirrhosis Father   . Hypertension Brother   . Stroke Paternal Grandmother   . Stroke Paternal Grandfather   . Pneumonia Mother     Past medical history, surgical history, medications, allergies, family history and social history reviewed with patient today and changes made to  appropriate areas of the chart.   Review of Systems  Constitutional: Negative.   HENT: Positive for hearing loss. Negative for congestion, ear discharge, ear pain, nosebleeds, sinus pain, sore throat and tinnitus.   Eyes: Negative.   Respiratory: Positive for wheezing. Negative for cough, hemoptysis, sputum production, shortness of breath and stridor.   Cardiovascular: Negative.   Gastrointestinal: Negative.   Genitourinary: Negative.   Musculoskeletal: Positive for back pain, joint pain and myalgias. Negative for falls and neck pain.  Skin: Negative.   Neurological: Negative.   Endo/Heme/Allergies: Negative.   Psychiatric/Behavioral: Negative.     All other ROS negative except what is listed above and in the HPI.      Objective:    BP (!) 182/96 (BP Location: Left Arm, Cuff Size: Normal)   Pulse 79   Temp 98.4 F (36.9 C) (Oral)   Ht 5\' 8"  (1.727 m)   Wt 266 lb 6.4 oz (120.8 kg)   SpO2 95%   BMI 40.51 kg/m   Wt Readings from Last 3 Encounters:  05/30/20 266 lb 6.4 oz (120.8 kg)  12/04/19 260 lb (117.9 kg)  10/08/19 270 lb (122.5 kg)    Physical Exam Vitals and nursing note reviewed.  Constitutional:      General: He is not in acute distress.    Appearance: Normal appearance. He is not ill-appearing, toxic-appearing or diaphoretic.  HENT:     Head: Normocephalic and atraumatic.     Right Ear: Tympanic membrane, ear canal and  external ear normal. There is no impacted cerumen.     Left Ear: Tympanic membrane, ear canal and external ear normal. There is no impacted cerumen.     Nose: Nose normal. No congestion or rhinorrhea.     Mouth/Throat:     Mouth: Mucous membranes are moist.     Pharynx: Oropharynx is clear. No oropharyngeal exudate or posterior oropharyngeal erythema.  Eyes:     General: No scleral icterus.       Right eye: No discharge.        Left eye: No discharge.     Extraocular Movements: Extraocular movements intact.     Conjunctiva/sclera: Conjunctivae normal.     Pupils: Pupils are equal, round, and reactive to light.  Neck:     Vascular: No carotid bruit.  Cardiovascular:     Rate and Rhythm: Normal rate and regular rhythm.     Pulses: Normal pulses.     Heart sounds: No murmur heard.  No friction rub. No gallop.   Pulmonary:     Effort: Pulmonary effort is normal. No respiratory distress.     Breath sounds: Normal breath sounds. No stridor. No wheezing, rhonchi or rales.  Chest:     Chest wall: No tenderness.  Abdominal:     General: Abdomen is flat. Bowel sounds are normal. There is no distension.     Palpations: Abdomen is soft. There is no mass.     Tenderness: There is no abdominal tenderness. There is no right CVA tenderness, left CVA tenderness, guarding or rebound.     Hernia: No hernia is present.  Genitourinary:    Comments: Breast and pelvic exams deferred with shared decision making Musculoskeletal:        General: No swelling, tenderness, deformity or signs of injury.     Cervical back: Normal range of motion and neck supple. No rigidity. No muscular tenderness.     Right lower leg: No edema.     Left lower leg:  No edema.  Lymphadenopathy:     Cervical: No cervical adenopathy.  Skin:    General: Skin is warm and dry.     Capillary Refill: Capillary refill takes less than 2 seconds.     Coloration: Skin is not jaundiced or pale.     Findings: No bruising, erythema,  lesion or rash.  Neurological:     General: No focal deficit present.     Mental Status: He is alert and oriented to person, place, and time. Mental status is at baseline.     Cranial Nerves: No cranial nerve deficit.     Sensory: No sensory deficit.     Motor: No weakness.     Coordination: Coordination normal.     Gait: Gait normal.     Deep Tendon Reflexes: Reflexes normal.  Psychiatric:        Mood and Affect: Mood normal.        Behavior: Behavior normal.        Thought Content: Thought content normal.        Judgment: Judgment normal.     Results for orders placed or performed in visit on 41/93/79  Basic metabolic panel  Result Value Ref Range   Glucose 168 (H) 65 - 99 mg/dL   BUN 17 8 - 27 mg/dL   Creatinine, Ser 1.06 0.76 - 1.27 mg/dL   GFR calc non Af Amer 75 >59 mL/min/1.73   GFR calc Af Amer 87 >59 mL/min/1.73   BUN/Creatinine Ratio 16 10 - 24   Sodium 137 134 - 144 mmol/L   Potassium 4.2 3.5 - 5.2 mmol/L   Chloride 100 96 - 106 mmol/L   CO2 20 20 - 29 mmol/L   Calcium 8.9 8.6 - 10.2 mg/dL      Assessment & Plan:   Problem List Items Addressed This Visit      Endocrine   Hypogonadism in male    Rechecking levels today. Await results. Treat as needed.       Relevant Orders   CBC with Differential/Platelet   Comprehensive metabolic panel   PSA   Urinalysis, Routine w reflex microscopic   Testosterone, free, total([facility])   Uncontrolled type 2 diabetes mellitus with hyperglycemia ([place])    Under good control on current regimen. Continue current regimen. Continue to monitor. Call with any concerns. Refills given. Labs drawn today.       Relevant Medications   benazepril (LOTENSIN) 40 MG tablet   metFORMIN (GLUCOPHAGE-XR) 500 MG 24 hr tablet   Other Relevant Orders   Bayer DCA Hb A1c Waived   CBC with Differential/Platelet   Comprehensive metabolic panel   Lipid Panel w/o Chol/HDL Ratio   Microalbumin, Urine Waived   Urinalysis, Routine w  reflex microscopic     Musculoskeletal and Integument   Degenerative joint disease (DJD) of lumbar spine    Stable on tramadol. Refills for 3 month given today. Follow up 3 months.       Relevant Medications   diclofenac (VOLTAREN) 75 MG EC tablet   traMADol (ULTRAM) 50 MG tablet     Genitourinary   Benign hypertensive renal disease    Not under good control. Did not take his medicine today. Encouraged him to take his medicine. Call with any concerns. Continue to monitor.       Relevant Orders   CBC with Differential/Platelet   Comprehensive metabolic panel   Microalbumin, Urine Waived   TSH     Other   Class 3 severe  obesity due to excess calories with serious comorbidity and body mass index (BMI) of 40.0 to 44.9 in adult [facility])    Encouraged diet and exercise with goal of losing 1-2lbs per week. Call with any concerns.       Relevant Medications   metFORMIN (GLUCOPHAGE-XR) 500 MG 24 hr tablet   Other Relevant Orders   CBC with Differential/Platelet   Comprehensive metabolic panel   TSH    Other Visit Diagnoses    Routine general medical examination at a health care facility    -  Primary   Skin tags, multiple acquired       Would like to see dermatology- referral generated today.   Relevant Orders   Ambulatory referral to Dermatology   History of URI (upper respiratory infection)       Would like COVID screen. Done today.   Relevant Orders   SAR CoV2 Serology (COVID 19)AB(IGG)IA       LABORATORY TESTING:  Health maintenance labs ordered today as discussed above.   The natural history of prostate cancer and ongoing controversy regarding screening and potential treatment outcomes of prostate cancer has been discussed with the patient. The meaning of a false positive PSA and a false negative PSA has been discussed. He indicates understanding of the limitations of this screening test and wishes to proceed with screening PSA testing.   IMMUNIZATIONS:   - Tdap:  Tetanus vaccination status reviewed: last tetanus booster within 10 years. - Influenza: Postponed to flu season - Pneumovax: Up to date - COVID: Will get it shortly  SCREENING: - Colonoscopy: Up to date  Discussed with patient purpose of the colonoscopy is to detect colon cancer at curable precancerous or early stages   PATIENT COUNSELING:    Sexuality: Discussed sexually transmitted diseases, partner selection, use of condoms, avoidance of unintended pregnancy  and contraceptive alternatives.   Advised to avoid cigarette smoking.  I discussed with the patient that most people either abstain from alcohol or drink within safe limits (<=14/week and <=4 drinks/occasion for males, <=7/weeks and <= 3 drinks/occasion for females) and that the risk for alcohol disorders and other health effects rises proportionally with the number of drinks per week and how often a drinker exceeds daily limits.  Discussed cessation/primary prevention of drug use and availability of treatment for abuse.   Diet: Encouraged to adjust caloric intake to maintain  or achieve ideal body weight, to reduce intake of dietary saturated fat and total fat, to limit sodium intake by avoiding high sodium foods and not adding table salt, and to maintain adequate dietary potassium and calcium preferably from fresh fruits, vegetables, and low-fat dairy products.    stressed the importance of regular exercise  Injury prevention: Discussed safety belts, safety helmets, smoke detector, smoking near bedding or upholstery.   Dental health: Discussed importance of regular tooth brushing, flossing, and dental visits.   Follow up plan: NEXT PREVENTATIVE PHYSICAL DUE IN 1 YEAR. Return in about 3 months (around 08/30/2020).

## 2020-05-30 NOTE — Patient Instructions (Signed)

## 2020-05-30 NOTE — Assessment & Plan Note (Signed)
Rechecking levels today. Await results. Treat as needed.  

## 2020-05-30 NOTE — Assessment & Plan Note (Signed)
Stable on tramadol. Refills for 3 month given today. Follow up 3 months.

## 2020-05-30 NOTE — Assessment & Plan Note (Signed)
Encouraged diet and exercise with goal of losing 1-2lbs per week. Call with any concerns.  

## 2020-06-01 DIAGNOSIS — M75111 Incomplete rotator cuff tear or rupture of right shoulder, not specified as traumatic: Secondary | ICD-10-CM | POA: Diagnosis not present

## 2020-06-01 DIAGNOSIS — M25511 Pain in right shoulder: Secondary | ICD-10-CM | POA: Diagnosis not present

## 2020-06-02 LAB — CBC WITH DIFFERENTIAL/PLATELET
Basophils Absolute: 0.1 10*3/uL (ref 0.0–0.2)
Basos: 1 %
EOS (ABSOLUTE): 0.3 10*3/uL (ref 0.0–0.4)
Eos: 3 %
Hematocrit: 54 % — ABNORMAL HIGH (ref 37.5–51.0)
Hemoglobin: 17.7 g/dL (ref 13.0–17.7)
Immature Grans (Abs): 0.1 10*3/uL (ref 0.0–0.1)
Immature Granulocytes: 1 %
Lymphocytes Absolute: 1.5 10*3/uL (ref 0.7–3.1)
Lymphs: 20 %
MCH: 31 pg (ref 26.6–33.0)
MCHC: 32.8 g/dL (ref 31.5–35.7)
MCV: 95 fL (ref 79–97)
Monocytes Absolute: 0.6 10*3/uL (ref 0.1–0.9)
Monocytes: 8 %
Neutrophils Absolute: 5 10*3/uL (ref 1.4–7.0)
Neutrophils: 67 %
Platelets: 155 10*3/uL (ref 150–450)
RBC: 5.71 x10E6/uL (ref 4.14–5.80)
RDW: 15.3 % (ref 11.6–15.4)
WBC: 7.5 10*3/uL (ref 3.4–10.8)

## 2020-06-02 LAB — LIPID PANEL W/O CHOL/HDL RATIO
Cholesterol, Total: 199 mg/dL (ref 100–199)
HDL: 36 mg/dL — ABNORMAL LOW (ref >39)
LDL Chol Calc (NIH): 131 mg/dL — ABNORMAL HIGH (ref 0–99)
Triglycerides: 179 mg/dL — ABNORMAL HIGH (ref 0–149)
VLDL Cholesterol Cal: 32 mg/dL (ref 5–40)

## 2020-06-02 LAB — COMPREHENSIVE METABOLIC PANEL
ALT: 41 IU/L (ref 0–44)
AST: 26 IU/L (ref 0–40)
Albumin/Globulin Ratio: 2.4 — ABNORMAL HIGH (ref 1.2–2.2)
Albumin: 4.5 g/dL (ref 3.8–4.8)
Alkaline Phosphatase: 105 IU/L (ref 48–121)
BUN/Creatinine Ratio: 18 (ref 10–24)
BUN: 17 mg/dL (ref 8–27)
Bilirubin Total: 0.6 mg/dL (ref 0.0–1.2)
CO2: 23 mmol/L (ref 20–29)
Calcium: 9.2 mg/dL (ref 8.6–10.2)
Chloride: 101 mmol/L (ref 96–106)
Creatinine, Ser: 0.97 mg/dL (ref 0.76–1.27)
GFR calc Af Amer: 96 mL/min/{1.73_m2} (ref >59)
GFR calc non Af Amer: 83 mL/min/{1.73_m2} (ref >59)
Globulin, Total: 1.9 g/dL (ref 1.5–4.5)
Glucose: 164 mg/dL — ABNORMAL HIGH (ref 65–99)
Potassium: 4.9 mmol/L (ref 3.5–5.2)
Sodium: 138 mmol/L (ref 134–144)
Total Protein: 6.4 g/dL (ref 6.0–8.5)

## 2020-06-02 LAB — PSA: Prostate Specific Ag, Serum: 1 ng/mL (ref 0.0–4.0)

## 2020-06-02 LAB — TESTOSTERONE, FREE, TOTAL, SHBG
Sex Hormone Binding: 22.8 nmol/L (ref 19.3–76.4)
Testosterone, Free: 23 pg/mL — ABNORMAL HIGH (ref 6.6–18.1)
Testosterone: 759 ng/dL (ref 264–916)

## 2020-06-02 LAB — SAR COV2 SEROLOGY (COVID19)AB(IGG),IA: DiaSorin SARS-CoV-2 Ab, IgG: NEGATIVE

## 2020-06-02 LAB — TSH: TSH: 4.35 u[IU]/mL (ref 0.450–4.500)

## 2020-06-03 DIAGNOSIS — M25511 Pain in right shoulder: Secondary | ICD-10-CM | POA: Diagnosis not present

## 2020-06-08 DIAGNOSIS — M25511 Pain in right shoulder: Secondary | ICD-10-CM | POA: Diagnosis not present

## 2020-06-08 DIAGNOSIS — M75111 Incomplete rotator cuff tear or rupture of right shoulder, not specified as traumatic: Secondary | ICD-10-CM | POA: Diagnosis not present

## 2020-06-13 DIAGNOSIS — M75111 Incomplete rotator cuff tear or rupture of right shoulder, not specified as traumatic: Secondary | ICD-10-CM | POA: Diagnosis not present

## 2020-06-13 DIAGNOSIS — M25511 Pain in right shoulder: Secondary | ICD-10-CM | POA: Diagnosis not present

## 2020-07-04 DIAGNOSIS — M25511 Pain in right shoulder: Secondary | ICD-10-CM | POA: Diagnosis not present

## 2020-07-04 DIAGNOSIS — M75111 Incomplete rotator cuff tear or rupture of right shoulder, not specified as traumatic: Secondary | ICD-10-CM | POA: Diagnosis not present

## 2020-07-07 DIAGNOSIS — M75111 Incomplete rotator cuff tear or rupture of right shoulder, not specified as traumatic: Secondary | ICD-10-CM | POA: Diagnosis not present

## 2020-07-07 DIAGNOSIS — M25511 Pain in right shoulder: Secondary | ICD-10-CM | POA: Diagnosis not present

## 2020-07-11 DIAGNOSIS — M25511 Pain in right shoulder: Secondary | ICD-10-CM | POA: Diagnosis not present

## 2020-07-30 ENCOUNTER — Other Ambulatory Visit: Payer: Self-pay | Admitting: Family Medicine

## 2020-08-01 ENCOUNTER — Other Ambulatory Visit: Payer: Self-pay | Admitting: Family Medicine

## 2020-08-01 NOTE — Telephone Encounter (Signed)
Requested medication (s) are due for refill today: yes  Requested medication (s) are on the active medication list: yes  Last refill:  05/30/20 with 5 refills  Future visit scheduled: yes  Notes to clinic:  Please review for refill. Unable to refill per protocol.    Requested Prescriptions  Pending Prescriptions Disp Refills   testosterone cypionate (DEPOTESTOSTERONE CYPIONATE) 200 MG/ML injection [Pharmacy Med Name: TESTOSTERONE CYP 200MG /ML SDV 1ML] 2 mL     Sig: ADMINISTER 1 ML(200 MG) IN THE MUSCLE EVERY 14 DAYS      Off-Protocol Failed - 08/01/2020 10:36 AM      Failed - Medication not assigned to a protocol, review manually.      Passed - Valid encounter within last 12 months    Recent Outpatient Visits           2 months ago Routine general medical examination at a health care facility   [facility], [place] P, DO   5 months ago Uncontrolled type 2 diabetes mellitus with hyperglycemia [facility])   [facility], [place] P, DO   8 months ago Uncontrolled type 2 diabetes mellitus with hyperglycemia ([place])   [place], Megan P, DO   8 months ago Uncontrolled type 2 diabetes mellitus with hyperglycemia [facility])   [place], Megan P, DO   9 months ago Osteoarthritis of spine with radiculopathy, cervical region   [place], Delia, DO       Future Appointments             In 1 month Wynetta Emery, Barb Merino, DO [employer], [place]   In 2 months Ralene Bathe, MD [place]

## 2020-08-02 NOTE — Telephone Encounter (Signed)
6 month refill given in August- should not be due

## 2020-08-05 ENCOUNTER — Other Ambulatory Visit: Payer: Self-pay | Admitting: Family Medicine

## 2020-09-05 ENCOUNTER — Ambulatory Visit (INDEPENDENT_AMBULATORY_CARE_PROVIDER_SITE_OTHER): Payer: BC Managed Care – PPO | Admitting: Family Medicine

## 2020-09-05 ENCOUNTER — Encounter: Payer: Self-pay | Admitting: Family Medicine

## 2020-09-05 ENCOUNTER — Other Ambulatory Visit: Payer: Self-pay

## 2020-09-05 VITALS — BP 143/81 | HR 76 | Temp 98.1°F | Ht 66.73 in | Wt 257.0 lb

## 2020-09-05 DIAGNOSIS — E291 Testicular hypofunction: Secondary | ICD-10-CM | POA: Diagnosis not present

## 2020-09-05 DIAGNOSIS — M170 Bilateral primary osteoarthritis of knee: Secondary | ICD-10-CM | POA: Diagnosis not present

## 2020-09-05 DIAGNOSIS — M179 Osteoarthritis of knee, unspecified: Secondary | ICD-10-CM | POA: Insufficient documentation

## 2020-09-05 DIAGNOSIS — I879 Disorder of vein, unspecified: Secondary | ICD-10-CM | POA: Insufficient documentation

## 2020-09-05 DIAGNOSIS — M1710 Unilateral primary osteoarthritis, unspecified knee: Secondary | ICD-10-CM | POA: Insufficient documentation

## 2020-09-05 DIAGNOSIS — Z23 Encounter for immunization: Secondary | ICD-10-CM

## 2020-09-05 DIAGNOSIS — M4726 Other spondylosis with radiculopathy, lumbar region: Secondary | ICD-10-CM

## 2020-09-05 DIAGNOSIS — E1165 Type 2 diabetes mellitus with hyperglycemia: Secondary | ICD-10-CM

## 2020-09-05 DIAGNOSIS — M1991 Primary osteoarthritis, unspecified site: Secondary | ICD-10-CM | POA: Insufficient documentation

## 2020-09-05 LAB — BAYER DCA HB A1C WAIVED: HB A1C (BAYER DCA - WAIVED): 5.9 % (ref <7.0)

## 2020-09-05 MED ORDER — METFORMIN HCL ER 500 MG PO TB24
1000.0000 mg | ORAL_TABLET | Freq: Two times a day (BID) | ORAL | 1 refills | Status: DC
Start: 2020-09-05 — End: 2021-03-22

## 2020-09-05 MED ORDER — TRAMADOL HCL 50 MG PO TABS: ORAL_TABLET | ORAL | 2 refills | Status: DC

## 2020-09-05 NOTE — Progress Notes (Signed)
BP (!) 143/81   Pulse 76   Temp 98.1 F (36.7 C) (Oral)   Ht 5' 6.73" (1.695 m)   Wt 257 lb (116.6 kg)   SpO2 98%   BMI 40.58 kg/m    Subjective:    Patient ID: Andrew [name], male    DOB: 07/02/1956, 64 y.o.   MRN: [id number]  HPI: Andrew [name] is a 64 y.o. male  Chief Complaint  Patient presents with  . Diabetes  . Medication Refill    testosterone  . Shoulder Pain    right shoulder post surgery   CHRONIC PAIN- he has been working with his orthopedist. 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 He is planning on retiring, but is thinking about applying for disability.   Present dose: 15 Morphine equivalents Pain control status: controlled Duration: chronic Location: low back, shoulder, neck Quality: aching and burning Current Pain Level: 1/10 Previous Pain Level: 3/10 Breakthrough pain: no Benefit from narcotic medications: yes What Activities task can be accomplished with current medication? Interested in weaning off narcotics:no   Stool softners/OTC fiber: no  Previous pain specialty evaluation: yes Non-narcotic analgesic meds: yes Narcotic contract: yes  DIABETES Hypoglycemic episodes:no Polydipsia/polyuria: no Visual disturbance: no Chest pain: no Paresthesias: no Glucose Monitoring: yes  Accucheck frequency: Not Checking  Fasting glucose: 150s-190s Taking Insulin?: no Blood Pressure Monitoring: not checking Retinal Examination: Not up to Date Foot Exam: Up to Date Diabetic Education: Completed Pneumovax: Up to Date Influenza: Up to Date Aspirin: no  LOW TESTOSTERONE- has been having issues with the pharmacy being able to pick up his medicine Duration: chronic Status: uncontrolled  Satisfied with current treatment:  yes Medication side effects:  no Medication compliance: good compliance Decreased libido: no Fatigue: no Depressed mood: no Muscle weakness:  no Erectile dysfunction: no   Relevant past medical, surgical, family and social history reviewed and updated as indicated. Interim medical history since our last visit reviewed. Allergies and medications reviewed and updated.  Review of Systems  Constitutional: Negative.   Respiratory: Negative.   Cardiovascular: Negative.   Gastrointestinal: Negative.   Musculoskeletal: Negative.   Neurological: Negative.   Psychiatric/Behavioral: Negative.     Per HPI unless specifically indicated above     Objective:    BP (!) 143/81   Pulse 76   Temp 98.1 F (36.7 C) (Oral)   Ht 5' 6.73" (1.695 m)   Wt 257 lb (116.6 kg)   SpO2 98%   BMI 40.58 kg/m   Wt Readings from Last 3 Encounters:  09/05/20 257 lb (116.6 kg)  05/30/20 266 lb 6.4 oz (120.8 kg)  12/04/19 260 lb (117.9 kg)    Physical Exam Vitals and nursing note reviewed.  Constitutional:      General: He is not in acute distress.    Appearance: Normal appearance. He is not ill-appearing, toxic-appearing or diaphoretic.  HENT:     Head: Normocephalic and atraumatic.     Right Ear: External ear normal.     Left Ear: External ear normal.     Nose: Nose normal.     Mouth/Throat:     Mouth: Mucous membranes are moist.     Pharynx: Oropharynx is clear.  Eyes:     General: No scleral icterus.       Right eye: No discharge.        Left eye: No discharge.  Extraocular Movements: Extraocular movements intact.     Conjunctiva/sclera: Conjunctivae normal.     Pupils: Pupils are equal, round, and reactive to light.  Cardiovascular:     Rate and Rhythm: Normal rate and regular rhythm.     Pulses: Normal pulses.     Heart sounds: Normal heart sounds. No murmur heard.  No friction rub. No gallop.   Pulmonary:     Effort: Pulmonary effort is normal. No respiratory distress.     Breath sounds: Normal breath sounds. No stridor. No wheezing, rhonchi or rales.  Chest:     Chest wall: No tenderness.  Musculoskeletal:         General: Normal range of motion.     Cervical back: Normal range of motion and neck supple.  Skin:    General: Skin is warm and dry.     Capillary Refill: Capillary refill takes less than 2 seconds.     Coloration: Skin is not jaundiced or pale.     Findings: No bruising, erythema, lesion or rash.  Neurological:     General: No focal deficit present.     Mental Status: He is alert and oriented to person, place, and time. Mental status is at baseline.  Psychiatric:        Mood and Affect: Mood normal.        Behavior: Behavior normal.        Thought Content: Thought content normal.        Judgment: Judgment normal.     Results for orders placed or performed in visit on 09/05/20  Bayer DCA Hb A1c Waived  Result Value Ref Range   HB A1C (BAYER DCA - WAIVED) 5.9 <7.0 %  Testosterone, free, total([facility])  Result Value Ref Range   Testosterone 45 (L) 264 - 916 ng/dL   Testosterone, Free WILL FOLLOW    Sex Hormone Binding 23.2 19.3 - 76.4 nmol/L      Assessment & Plan:   Problem List Items Addressed This Visit      Endocrine   Hypogonadism in male    Rechecking labs today. Refill should be up to date. Continue current regimen. Continue to monitor.       Relevant Orders   Testosterone, free, total([facility]) (Completed)   Uncontrolled type 2 diabetes mellitus with hyperglycemia ([place]) - Primary    Doing great with A1c of 5.9. Continue current regimen. If still doing this well in 3 months, will cut back on his metformin.       Relevant Medications   metFORMIN (GLUCOPHAGE-XR) 500 MG 24 hr tablet   Other Relevant Orders   Bayer DCA Hb A1c Waived (Completed)     Musculoskeletal and Integument   Primary osteoarthritis of both knees    Under good control on current regimen. Continue current regimen. Continue to monitor. Call with any concerns. Refills given for 3 months. Follow up 3 months.         Relevant Medications   traMADol (ULTRAM) 50 MG tablet    Degenerative joint disease (DJD) of lumbar spine    Under good control on current regimen. Continue current regimen. Continue to monitor. Call with any concerns. Refills given for 3 months. Follow up 3 months.        Relevant Medications   traMADol (ULTRAM) 50 MG tablet    Other Visit Diagnoses    Need for influenza vaccination       Flu shot given today.   Relevant Orders   Flu Vaccine QUAD  6+ mos PF IM (Fluarix Quad PF) (Completed)       Follow up plan: Return in about 3 months (around 12/04/2020).

## 2020-09-06 ENCOUNTER — Encounter: Payer: Self-pay | Admitting: Family Medicine

## 2020-09-06 LAB — TESTOSTERONE, FREE, TOTAL, SHBG
Sex Hormone Binding: 23.2 nmol/L (ref 19.3–76.4)
Testosterone, Free: 2.3 pg/mL — ABNORMAL LOW (ref 6.6–18.1)
Testosterone: 45 ng/dL — ABNORMAL LOW (ref 264–916)

## 2020-09-06 NOTE — Assessment & Plan Note (Signed)
Doing great with A1c of 5.9. Continue current regimen. If still doing this well in 3 months, will cut back on his metformin.

## 2020-09-06 NOTE — Assessment & Plan Note (Signed)
Under good control on current regimen. Continue current regimen. Continue to monitor. Call with any concerns. Refills given for 3 months. Follow up 3 months.    

## 2020-09-06 NOTE — Assessment & Plan Note (Signed)
Rechecking labs today. Refill should be up to date. Continue current regimen. Continue to monitor.

## 2020-10-05 ENCOUNTER — Ambulatory Visit: Payer: BLUE CROSS/BLUE SHIELD | Admitting: Dermatology

## 2020-10-26 ENCOUNTER — Telehealth: Payer: Self-pay

## 2020-10-26 NOTE — Telephone Encounter (Signed)
Copied from [place] [phone]. Topic: General - Call Back - No Documentation >> Oct 26, 2020  1:37 PM Erick Blinks wrote: Best contact: [phone] Roselyn Reef From [place] called requesting a call back from office, wants to discuss pt's out of work dates. Please advise

## 2020-10-27 NOTE — Telephone Encounter (Signed)
Called and left a message for Andrew [name] to return my call.  Spoke with patient, he states that his first day out of work was 09/14/19 His medical Leave ran out 09/13/20 so he is no longer employed.

## 2020-10-28 NOTE — Telephone Encounter (Signed)
Called and LVM for Andrew [name] asking for her to please return my call.

## 2020-10-31 NOTE — Telephone Encounter (Signed)
Called and left Roselyn Reef a VM asking for her to please return my call if she still needed to discuss something.

## 2020-11-10 ENCOUNTER — Other Ambulatory Visit: Payer: Self-pay | Admitting: Student

## 2020-11-10 DIAGNOSIS — M5442 Lumbago with sciatica, left side: Secondary | ICD-10-CM

## 2020-11-19 ENCOUNTER — Ambulatory Visit
Admission: RE | Admit: 2020-11-19 | Discharge: 2020-11-19 | Disposition: A | Discharge status: Home / Self Care | Payer: Managed Care, Other (non HMO) | Source: Ambulatory Visit | Attending: Student | Admitting: Student

## 2020-11-19 ENCOUNTER — Other Ambulatory Visit: Payer: Self-pay

## 2020-11-19 DIAGNOSIS — M5442 Lumbago with sciatica, left side: Secondary | ICD-10-CM | POA: Insufficient documentation

## 2020-12-01 ENCOUNTER — Other Ambulatory Visit: Payer: Self-pay | Admitting: Family Medicine

## 2020-12-01 NOTE — Telephone Encounter (Signed)
Patient was last seen in 09/05/20 and is due for an appointment around 12/04/20 per your last office note.

## 2020-12-01 NOTE — Telephone Encounter (Signed)
Requested medication (s) are due for refill today - yes  Requested medication (s) are on the active medication list -yes  Future visit scheduled -no  Last refill: 10/14/20  Notes to clinic: Request RF- medication not assigned protocol  Requested Prescriptions  Pending Prescriptions Disp Refills   testosterone cypionate (DEPOTESTOSTERONE CYPIONATE) 200 MG/ML injection [Pharmacy Med Name: TESTOSTERONE CYP 200MG /ML SDV 1ML] 2 mL     Sig: ADMINISTER 1 ML(200 MG) IN THE MUSCLE EVERY 14 DAYS      Off-Protocol Failed - 12/01/2020 12:37 PM      Failed - Medication not assigned to a protocol, review manually.      Passed - Valid encounter within last 12 months    Recent Outpatient Visits           2 months ago Uncontrolled type 2 diabetes mellitus with hyperglycemia ([place])   [place], Megan P, DO   6 months ago Routine general medical examination at a health care facility   [facility], [place] P, DO   10 months ago Uncontrolled type 2 diabetes mellitus with hyperglycemia [facility])   [facility], Megan P, DO   12 months ago Uncontrolled type 2 diabetes mellitus with hyperglycemia [facility])   [place], [place] P, DO   1 year ago Uncontrolled type 2 diabetes mellitus with hyperglycemia ([place])   [place], [place], DO       Future Appointments             In 1 month Brendolyn Patty, MD [place]                 Requested Prescriptions  Pending Prescriptions Disp Refills   testosterone cypionate (DEPOTESTOSTERONE CYPIONATE) 200 MG/ML injection [Pharmacy Med Name: TESTOSTERONE CYP 200MG /ML SDV 1ML] 2 mL     Sig: ADMINISTER 1 ML(200 MG) IN THE MUSCLE EVERY 14 DAYS      Off-Protocol Failed - 12/01/2020 12:37 PM      Failed - Medication not assigned to a protocol, review manually.      Passed - Valid encounter within last 12 months    Recent Outpatient Visits            2 months ago Uncontrolled type 2 diabetes mellitus with hyperglycemia ([place])   [place], Megan P, DO   6 months ago Routine general medical examination at a health care facility   [facility], [place] P, DO   10 months ago Uncontrolled type 2 diabetes mellitus with hyperglycemia [facility])   [facility], Megan P, DO   12 months ago Uncontrolled type 2 diabetes mellitus with hyperglycemia [facility])   [place], [place], DO   1 year ago Uncontrolled type 2 diabetes mellitus with hyperglycemia [facility])   [facility] Valerie Roys, DO       Future Appointments             In 1 month Brendolyn Patty, MD [place]

## 2020-12-03 NOTE — Telephone Encounter (Signed)
Needs appt

## 2020-12-06 ENCOUNTER — Other Ambulatory Visit: Payer: Self-pay

## 2020-12-06 NOTE — Telephone Encounter (Signed)
Refill request for Testosterone Cyp 200 mg/ml SDV 1 ml  LOV 09/05/20  No up coming appointment   Patient needs appointment

## 2020-12-06 NOTE — Telephone Encounter (Signed)
PT STATED WOULD CALL BACK TO MAKE APT.

## 2020-12-26 ENCOUNTER — Other Ambulatory Visit: Payer: Self-pay

## 2020-12-26 ENCOUNTER — Ambulatory Visit: Payer: Managed Care, Other (non HMO) | Admitting: Family Medicine

## 2020-12-26 ENCOUNTER — Encounter: Payer: Self-pay | Admitting: Family Medicine

## 2020-12-26 VITALS — BP 115/75 | HR 58 | Temp 97.7°F | Wt 259.0 lb

## 2020-12-26 DIAGNOSIS — E1165 Type 2 diabetes mellitus with hyperglycemia: Secondary | ICD-10-CM | POA: Diagnosis not present

## 2020-12-26 DIAGNOSIS — E291 Testicular hypofunction: Secondary | ICD-10-CM

## 2020-12-26 DIAGNOSIS — I129 Hypertensive chronic kidney disease with stage 1 through stage 4 chronic kidney disease, or unspecified chronic kidney disease: Secondary | ICD-10-CM

## 2020-12-26 DIAGNOSIS — N401 Enlarged prostate with lower urinary tract symptoms: Secondary | ICD-10-CM

## 2020-12-26 DIAGNOSIS — R809 Proteinuria, unspecified: Secondary | ICD-10-CM

## 2020-12-26 DIAGNOSIS — G8929 Other chronic pain: Secondary | ICD-10-CM

## 2020-12-26 DIAGNOSIS — E1129 Type 2 diabetes mellitus with other diabetic kidney complication: Secondary | ICD-10-CM | POA: Diagnosis not present

## 2020-12-26 DIAGNOSIS — R3914 Feeling of incomplete bladder emptying: Secondary | ICD-10-CM

## 2020-12-26 DIAGNOSIS — M545 Low back pain, unspecified: Secondary | ICD-10-CM

## 2020-12-26 MED ORDER — TRAMADOL HCL 50 MG PO TABS: ORAL_TABLET | ORAL | 2 refills | Status: DC

## 2020-12-26 MED ORDER — TESTOSTERONE CYPIONATE 200 MG/ML IM SOLN: INTRAMUSCULAR | 5 refills | Status: DC

## 2020-12-26 MED ORDER — BENAZEPRIL HCL 40 MG PO TABS: 40.0000 mg | ORAL_TABLET | Freq: Every day | ORAL | 1 refills | Status: DC

## 2020-12-26 MED ORDER — METOPROLOL SUCCINATE ER 100 MG PO TB24: ORAL_TABLET | ORAL | 1 refills | Status: DC

## 2020-12-26 MED ORDER — TAMSULOSIN HCL 0.4 MG PO CAPS: 0.4000 mg | ORAL_CAPSULE | Freq: Every day | ORAL | 3 refills | Status: DC

## 2020-12-26 MED ORDER — BUPROPION HCL ER (SR) 150 MG PO TB12: 150.0000 mg | ORAL_TABLET | Freq: Two times a day (BID) | ORAL | 1 refills | Status: DC

## 2020-12-26 MED ORDER — HYDROCHLOROTHIAZIDE 12.5 MG PO CAPS: 12.5000 mg | ORAL_CAPSULE | Freq: Every day | ORAL | 1 refills | Status: DC

## 2020-12-26 MED ORDER — SILDENAFIL CITRATE 100 MG PO TABS: ORAL_TABLET | ORAL | 12 refills | Status: DC

## 2020-12-26 NOTE — Assessment & Plan Note (Signed)
Under good control on current regimen. Continue current regimen. Continue to monitor. Call with any concerns. Refills given. Labs drawn today.   

## 2020-12-26 NOTE — Assessment & Plan Note (Signed)
Will start flomax and recheck 3 months. Call with any concerns.

## 2020-12-26 NOTE — Progress Notes (Signed)
BP 115/75   Pulse (!) 58   Temp 97.7 F (36.5 C)   Wt 259 lb (117.5 kg)   SpO2 99%   BMI 40.89 kg/m    Subjective:    Patient ID: Andrew [name], male    DOB: Jul 13, 1956, 65 y.o.   MRN: [id number]  HPI: Andrew [name] is a 65 y.o. male  Chief Complaint  Patient presents with  . Osteoarthritis    Follow up both knees   . Diabetes  . degenerative joint disease  . Hypogonadism   DIABETES Hypoglycemic episodes:no Polydipsia/polyuria: no Visual disturbance: no Chest pain: no Paresthesias: no Glucose Monitoring: no  Accucheck frequency: Not Checking Taking Insulin?: no Blood Pressure Monitoring: not checking Retinal Examination: Not up to Date Foot Exam: done today Diabetic Education: Completed Pneumovax: Up to Date Influenza: Up to Date Aspirin: yes  HYPERTENSION / HYPERLIPIDEMIA Satisfied with current treatment? yes Duration of hypertension: chronic BP monitoring frequency: not checking BP medication side effects: no Past BP meds: metoprolol, benazepril Duration of hyperlipidemia: n/a Cholesterol medication side effects: not on anything Cholesterol supplements: none Past cholesterol medications: none Medication compliance: excellent compliance Aspirin: yes Recent stressors: no Recurrent headaches: no Visual changes: no Palpitations: no Dyspnea: no Chest pain: no Lower extremity edema: no Dizzy/lightheaded: no  LOW TESTOSTERONE- has not taken his dose in over a month, probably about 6 weeks Duration: chronic Status: uncontrolled  Satisfied with current treatment:  yes Previous testosterone therapies: injections, androgel Medication side effects:  no Medication compliance: good compliance Decreased libido: yes Fatigue: yes Depressed mood: yes Muscle weakness: yes Erectile dysfunction: yes  CHRONIC PAIN  Present dose: 150 Morphine equivalents Pain control status: stable Duration: chronic Location: low back and knees Quality: aching and  sore Current Pain Level: moderate Previous Pain Level: moderate Breakthrough pain: yes Benefit from narcotic medications: yes What Activities task can be accomplished with current medication? Able to do his ADLs Interested in weaning off narcotics:no   Stool softners/OTC fiber: yes  Previous pain specialty evaluation: yes Non-narcotic analgesic meds: yes Narcotic contract: yes  BPH BPH status: uncontrolled Satisfied with current treatment?: no Medication side effects: not on anything Duration: months Nocturia: 2-3x per night Urinary frequency:yes Incomplete voiding: yes Urgency: no Weak urinary stream: yes Straining to start stream: no Dysuria: no Onset: gradual Severity: mild   Relevant past medical, surgical, family and social history reviewed and updated as indicated. Interim medical history since our last visit reviewed. Allergies and medications reviewed and updated.  Review of Systems  Constitutional: Negative.   Respiratory: Negative.   Cardiovascular: Negative.   Gastrointestinal: Negative.   Genitourinary: Negative.   Musculoskeletal: Positive for back pain and myalgias. Negative for arthralgias, gait problem, joint swelling, neck pain and neck stiffness.  Skin: Negative.   Psychiatric/Behavioral: Negative.     Per HPI unless specifically indicated above     Objective:    BP 115/75   Pulse (!) 58   Temp 97.7 F (36.5 C)   Wt 259 lb (117.5 kg)   SpO2 99%   BMI 40.89 kg/m   Wt Readings from Last 3 Encounters:  12/26/20 259 lb (117.5 kg)  09/05/20 257 lb (116.6 kg)  05/30/20 266 lb 6.4 oz (120.8 kg)    Physical Exam Vitals and nursing note reviewed.  Constitutional:      General: He is not in acute distress.    Appearance: Normal appearance. He is obese. He is not ill-appearing, toxic-appearing or diaphoretic.  HENT:  Head: Normocephalic and atraumatic.     Right Ear: External ear normal.     Left Ear: External ear normal.     Nose: Nose  normal.     Mouth/Throat:     Mouth: Mucous membranes are moist.     Pharynx: Oropharynx is clear.  Eyes:     General: No scleral icterus.       Right eye: No discharge.        Left eye: No discharge.     Extraocular Movements: Extraocular movements intact.     Conjunctiva/sclera: Conjunctivae normal.     Pupils: Pupils are equal, round, and reactive to light.  Cardiovascular:     Rate and Rhythm: Normal rate and regular rhythm.     Pulses: Normal pulses.     Heart sounds: Normal heart sounds. No murmur heard. No friction rub. No gallop.   Pulmonary:     Effort: Pulmonary effort is normal. No respiratory distress.     Breath sounds: Normal breath sounds. No stridor. No wheezing, rhonchi or rales.  Chest:     Chest wall: No tenderness.  Musculoskeletal:        General: Normal range of motion.     Cervical back: Normal range of motion and neck supple.  Skin:    General: Skin is warm and dry.     Capillary Refill: Capillary refill takes less than 2 seconds.     Coloration: Skin is not jaundiced or pale.     Findings: No bruising, erythema, lesion or rash.  Neurological:     General: No focal deficit present.     Mental Status: He is alert and oriented to person, place, and time. Mental status is at baseline.  Psychiatric:        Mood and Affect: Mood normal.        Behavior: Behavior normal.        Thought Content: Thought content normal.        Judgment: Judgment normal.     Results for orders placed or performed in visit on 09/05/20  Bayer DCA Hb A1c Waived  Result Value Ref Range   HB A1C (BAYER DCA - WAIVED) 5.9 <7.0 %  Testosterone, free, total([facility])  Result Value Ref Range   Testosterone 45 (L) 264 - 916 ng/dL   Testosterone, Free 2.3 (L) 6.6 - 18.1 pg/mL   Sex Hormone Binding 23.2 19.3 - 76.4 nmol/L      Assessment & Plan:   Problem List Items Addressed This Visit      Endocrine   Hypogonadism in male    Rechecking labs today. Testosterone very  low last visit. Tolerating meds well. Continue to monitor.       Relevant Orders   Comprehensive metabolic panel   CBC with Differential/Platelet   PSA   Testosterone, free, total([facility])   Controlled type 2 diabetes mellitus with microalbuminuria ([place]) - Primary    Up slightly with A1c of 6.7 up from 5.9- watch diet and exercise and recheck 3 months. Call with any concerns.       Relevant Medications   benazepril (LOTENSIN) 40 MG tablet     Genitourinary   Benign hypertensive renal disease    Under good control on current regimen. Continue current regimen. Continue to monitor. Call with any concerns. Refills given. Labs drawn today.        Relevant Orders   Comprehensive metabolic panel   CBC with Differential/Platelet   Benign prostatic hyperplasia with incomplete bladder  emptying    Will start flomax and recheck 3 months. Call with any concerns.       Relevant Medications   tamsulosin (FLOMAX) 0.4 MG CAPS capsule     Other   Chronic low back pain    Under good control on current regimen. Continue current regimen. Continue to monitor. Call with any concerns. Refills given for 3 months. Follow up 3 months.       Relevant Medications   traMADol (ULTRAM) 50 MG tablet       Follow up plan: Return in about 3 months (around 03/28/2021) for before 03/26/20 for follow up.

## 2020-12-26 NOTE — Assessment & Plan Note (Signed)
Under good control on current regimen. Continue current regimen. Continue to monitor. Call with any concerns. Refills given for 3 months. Follow up 3 months.    

## 2020-12-26 NOTE — Assessment & Plan Note (Signed)
Up slightly with A1c of 6.7 up from 5.9- watch diet and exercise and recheck 3 months. Call with any concerns.

## 2020-12-26 NOTE — Assessment & Plan Note (Signed)
Rechecking labs today. Testosterone very low last visit. Tolerating meds well. Continue to monitor.

## 2020-12-27 LAB — BAYER DCA HB A1C WAIVED: HB A1C (BAYER DCA - WAIVED): 6.7 % (ref <7.0)

## 2020-12-28 LAB — CBC WITH DIFFERENTIAL/PLATELET
Basophils Absolute: 0.1 10*3/uL (ref 0.0–0.2)
Basos: 1 %
EOS (ABSOLUTE): 0.3 10*3/uL (ref 0.0–0.4)
Eos: 4 %
Hematocrit: 49 % (ref 37.5–51.0)
Hemoglobin: 17.1 g/dL (ref 13.0–17.7)
Immature Grans (Abs): 0 10*3/uL (ref 0.0–0.1)
Immature Granulocytes: 1 %
Lymphocytes Absolute: 1.6 10*3/uL (ref 0.7–3.1)
Lymphs: 23 %
MCH: 32 pg (ref 26.6–33.0)
MCHC: 34.9 g/dL (ref 31.5–35.7)
MCV: 92 fL (ref 79–97)
Monocytes Absolute: 0.5 10*3/uL (ref 0.1–0.9)
Monocytes: 7 %
Neutrophils Absolute: 4.3 10*3/uL (ref 1.4–7.0)
Neutrophils: 64 %
Platelets: 177 10*3/uL (ref 150–450)
RBC: 5.35 x10E6/uL (ref 4.14–5.80)
RDW: 13.2 % (ref 11.6–15.4)
WBC: 6.8 10*3/uL (ref 3.4–10.8)

## 2020-12-28 LAB — COMPREHENSIVE METABOLIC PANEL
ALT: 49 IU/L — ABNORMAL HIGH (ref 0–44)
AST: 31 IU/L (ref 0–40)
Albumin/Globulin Ratio: 1.9 (ref 1.2–2.2)
Albumin: 4.3 g/dL (ref 3.8–4.8)
Alkaline Phosphatase: 97 IU/L (ref 44–121)
BUN/Creatinine Ratio: 23 (ref 10–24)
BUN: 30 mg/dL — ABNORMAL HIGH (ref 8–27)
Bilirubin Total: 0.6 mg/dL (ref 0.0–1.2)
CO2: 19 mmol/L — ABNORMAL LOW (ref 20–29)
Calcium: 9.6 mg/dL (ref 8.6–10.2)
Chloride: 103 mmol/L (ref 96–106)
Creatinine, Ser: 1.29 mg/dL — ABNORMAL HIGH (ref 0.76–1.27)
Globulin, Total: 2.3 g/dL (ref 1.5–4.5)
Glucose: 220 mg/dL — ABNORMAL HIGH (ref 65–99)
Potassium: 4.9 mmol/L (ref 3.5–5.2)
Sodium: 141 mmol/L (ref 134–144)
Total Protein: 6.6 g/dL (ref 6.0–8.5)
eGFR: 62 mL/min/{1.73_m2} (ref >59)

## 2020-12-28 LAB — LIPID PANEL W/O CHOL/HDL RATIO
Cholesterol, Total: 209 mg/dL — ABNORMAL HIGH (ref 100–199)
HDL: 33 mg/dL — ABNORMAL LOW (ref >39)
LDL Chol Calc (NIH): 134 mg/dL — ABNORMAL HIGH (ref 0–99)
Triglycerides: 234 mg/dL — ABNORMAL HIGH (ref 0–149)
VLDL Cholesterol Cal: 42 mg/dL — ABNORMAL HIGH (ref 5–40)

## 2020-12-28 LAB — TESTOSTERONE, FREE, TOTAL, SHBG
Sex Hormone Binding: 25.7 nmol/L (ref 19.3–76.4)
Testosterone, Free: 3.5 pg/mL — ABNORMAL LOW (ref 6.6–18.1)
Testosterone: 89 ng/dL — ABNORMAL LOW (ref 264–916)

## 2020-12-28 LAB — PSA: Prostate Specific Ag, Serum: 0.8 ng/mL (ref 0.0–4.0)

## 2021-01-17 ENCOUNTER — Ambulatory Visit: Payer: Managed Care, Other (non HMO) | Admitting: Dermatology

## 2021-01-17 ENCOUNTER — Other Ambulatory Visit: Payer: Self-pay

## 2021-01-17 DIAGNOSIS — L578 Other skin changes due to chronic exposure to nonionizing radiation: Secondary | ICD-10-CM | POA: Diagnosis not present

## 2021-01-17 DIAGNOSIS — D229 Melanocytic nevi, unspecified: Secondary | ICD-10-CM

## 2021-01-17 DIAGNOSIS — L732 Hidradenitis suppurativa: Secondary | ICD-10-CM | POA: Diagnosis not present

## 2021-01-17 DIAGNOSIS — L720 Epidermal cyst: Secondary | ICD-10-CM | POA: Diagnosis not present

## 2021-01-17 DIAGNOSIS — L570 Actinic keratosis: Secondary | ICD-10-CM

## 2021-01-17 DIAGNOSIS — D492 Neoplasm of unspecified behavior of bone, soft tissue, and skin: Secondary | ICD-10-CM

## 2021-01-17 DIAGNOSIS — L821 Other seborrheic keratosis: Secondary | ICD-10-CM

## 2021-01-17 DIAGNOSIS — D1800 Hemangioma unspecified site: Secondary | ICD-10-CM

## 2021-01-17 DIAGNOSIS — D485 Neoplasm of uncertain behavior of skin: Secondary | ICD-10-CM | POA: Diagnosis not present

## 2021-01-17 DIAGNOSIS — Z1283 Encounter for screening for malignant neoplasm of skin: Secondary | ICD-10-CM

## 2021-01-17 DIAGNOSIS — L814 Other melanin hyperpigmentation: Secondary | ICD-10-CM

## 2021-01-17 MED ORDER — DOXYCYCLINE MONOHYDRATE 100 MG PO CAPS: ORAL_CAPSULE | ORAL | 3 refills | Status: DC

## 2021-01-17 MED ORDER — CLINDAMYCIN PHOSPHATE 1 % EX SOLN: CUTANEOUS | 2 refills | Status: DC

## 2021-01-17 NOTE — Progress Notes (Signed)
New Patient Visit  Subjective  Andrew [name] is a 65 y.o. male who presents for the following: New Patient (Initial Visit) (Patient here with several skin issues. It has been several years since he has been here. He denies history personal and family history of skin cancer. Patient has a spot on right side face that he would like checked he states he has had for several years. Reports a bump on back that he noticed and would like to have checked. Patient reports he has several boils on scrotum and flare up periodically. Patient would also like to discuss skin tags. Patient reports some dry skin around lip to check).   Objective  Well appearing patient in no apparent distress; mood and affect are within normal limits.  A full examination was performed including scalp, head, eyes, ears, nose, lips, neck, chest, axillae, abdomen, back, buttocks, bilateral upper extremities, bilateral lower extremities, hands, feet, fingers, toes, fingernails, and toenails. All findings within normal limits unless otherwise noted below.  Objective  groin/scrotum/buttocks: Scarring in perineum with firm SQ nodule inf scrotum  Objective  right cheek: Firm Subcutaneous papule/nodule  Objective  left spinal mid back: 9.0 mm flesh papule- gets caught on clothing       Objective  right lower lip: Erythematous thin papules/macules with gritty scale.   Assessment & Plan  Hidradenitis suppurativa groin/scrotum/buttocks  Hidradenitis Suppurativa is a chronic; persistent; non-curable, but treatable condition due to abnormal inflamed sweat glands in the body folds (axilla, inframammary, groin, medial thighs), causing recurrent painful cysts and scarring. It can be associated with severe scarring acne and cysts; abscesses and scarring of scalp. The goal is control and prevention of flares, as it is not curable. Scars are permanent and can be thickened. Treatment may include daily use of topical medication and  oral antibiotics.  Oral isotretinoin may also be helpful.  For more severe cases, Humira (a biologic injection) may be prescribed to decrease the inflammatory process and prevent flares.  When indicated, inflamed cysts may also be treated surgically.    Start Doxycycline 100 mg take 1 tablet twice a day   Doxycycline should be taken with food to prevent nausea. Do not lay down for 30 minutes after taking. Be cautious with sun exposure and use good sun protection while on this medication. Pregnant women should not take this medication.    Start Clindamycin solution apply to groin qd/bid   Start otc 4% Panoxyl wash apply to skin daily in shower and rinse after several minutes   May start warm compresses if cyst become inflamed  If cyst remains symptomatic, recommend surgery removal    Ordered Medications: doxycycline (MONODOX) 100 MG capsule clindamycin (CLEOCIN T) 1 % external solution  Epidermal inclusion cyst right cheek  Benign-appearing. Exam most consistent with an epidermal inclusion cyst. Discussed that a cyst is a benign growth that can grow over time and sometimes get irritated or inflamed. Recommend observation if it is not bothersome. Discussed option of surgical excision to remove it if it is growing, symptomatic, or other changes noted. Please call for new or changing lesions so they can be evaluated.      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  Epidermal / dermal shaving  Lesion diameter (cm):  0.9 Informed consent: discussed and consent obtained   Timeout: patient name, date of birth, surgical site, and procedure verified   Procedure prep:  Patient was prepped and draped in usual sterile fashion Prep type:  Isopropyl alcohol  Anesthesia: the lesion was anesthetized in a standard fashion   Anesthetic:  1% lidocaine w/ epinephrine 1-100,000 buffered w/ 8.4% NaHCO3 Hemostasis achieved with: pressure, aluminum chloride and electrodesiccation   Outcome: patient  tolerated procedure well   Post-procedure details: sterile dressing applied and wound care instructions given   Dressing type: bandage and petrolatum    Specimen 1 - Surgical pathology Differential Diagnosis: R/O Irritated nevus vs Dysplastic nevus    Check Margins: No 9.0 mm flesh  AK (actinic keratosis) right lower lip  Destruction of lesion - right lower lip  Destruction method: cryotherapy   Informed consent: discussed and consent obtained   Timeout:  patient name, date of birth, surgical site, and procedure verified Lesion destroyed using liquid nitrogen: Yes   Region frozen until ice ball extended beyond lesion: Yes   Outcome: patient tolerated procedure well with no complications   Post-procedure details: wound care instructions given     Lentigines - Scattered tan macules - Due to sun exposure - Benign-appering, observe - Recommend daily broad spectrum sunscreen SPF 30+ to sun-exposed areas, reapply every 2 hours as needed. - Call for any changes  Seborrheic Keratoses - Stuck-on, waxy, tan-brown papules and/or plaques  - Benign-appearing - Discussed benign etiology and prognosis. - Observe - Call for any changes  Melanocytic Nevi Back  - Tan-brown and/or pink-flesh-colored symmetric macules and papules - Benign appearing on exam today - Observation - Call clinic for new or changing moles - Recommend daily use of broad spectrum spf 30+ sunscreen to sun-exposed areas.   Hemangiomas - Red papules - Discussed benign nature - Observe - Call for any changes  Actinic Damage - Chronic condition, secondary to cumulative UV/sun exposure - diffuse scaly erythematous macules with underlying dyspigmentation - Recommend daily broad spectrum sunscreen SPF 30+ to sun-exposed areas, reapply every 2 hours as needed.  - Staying in the shade or wearing long sleeves, sun glasses (UVA+UVB protection) and wide brim hats (4-inch brim around the entire circumference of the hat)  are also recommended for sun protection.  - Call for new or changing lesions.  Acrochordons (Skin Tags) Axilla-bilateral, left groin  - Fleshy, skin-colored pedunculated papules - Benign appearing.  - Observe. - If desired, they can be removed with an in office procedure that is may or may not be covered by insurance.  Will remove on f/up. - Please call the clinic if you notice any new or changing lesions.   Return in about 2 months (around 03/19/2021) for skin tags, hidradentitis .   I, Marye Round, CMA, am acting as scribe for Brendolyn Patty, MD .  Documentation: I have reviewed the above documentation for accuracy and completeness, and I agree with the above.  Brendolyn Patty MD

## 2021-01-17 NOTE — Patient Instructions (Addendum)
Melanoma ABCDEs  Melanoma is the most dangerous type of skin cancer, and is the leading cause of death from skin disease.  You are more likely to develop melanoma if you:  Have light-colored skin, light-colored eyes, or red or blond hair  Spend a lot of time in the sun  Tan regularly, either outdoors or in a tanning bed  Have had blistering sunburns, especially during childhood  Have a close family member who has had a melanoma  Have atypical moles or large birthmarks  Early detection of melanoma is key since treatment is typically straightforward and cure rates are extremely high if we catch it early.   The first sign of melanoma is often a change in a mole or a new dark spot.  The ABCDE system is a way of remembering the signs of melanoma.  A for asymmetry:  The two halves do not match. B for border:  The edges of the growth are irregular. C for color:  A mixture of colors are present instead of an even brown color. D for diameter:  Melanomas are usually (but not always) greater than 59mm - the size of a pencil eraser. E for evolution:  The spot keeps changing in size, shape, and color.  Please check your skin once per month between visits. You can use a small mirror in front and a large mirror behind you to keep an eye on the back side or your body.   If you see any new or changing lesions before your next follow-up, please call to schedule a visit.  Please continue daily skin protection including broad spectrum sunscreen SPF 30+ to sun-exposed areas, reapplying every 2 hours as needed when you're outdoors.   Staying in the shade or wearing long sleeves, sun glasses (UVA+UVB protection) and wide brim hats (4-inch brim around the entire circumference of the hat) are also recommended for sun protection.   If you have any questions or concerns for your doctor, please call our main line at [phone] and press option 4 to reach your doctor's medical assistant. If no one answers,  please leave a voicemail as directed and we will return your call as soon as possible. Messages left after 4 pm will be answered the following business day.   You may also send [place] a message via [place]. We typically respond to MyChart messages within 1-2 business days.  For prescription refills, please ask your pharmacy to contact our office. Our fax number is [phone].  If you have an urgent issue when the clinic is closed that cannot wait until the next business day, you can page your doctor at the number below.    Please note that while we do our best to be available for urgent issues outside of office hours, we are not available 24/7.   If you have an urgent issue and are unable to reach [place], you may choose to seek medical care at your doctor's office, retail clinic, urgent care center, or emergency room.  If you have a medical emergency, please immediately call 911 or go to the emergency department.  Pager Numbers  - Dr. Nehemiah Massed: [phone]  - Dr. Laurence Ferrari: [phone]  - Dr. Nicole Kindred: [phone]  In the event of inclement weather, please call our main line at [phone] for an update on the status of any delays or closures.  Dermatology Medication Tips: Please keep the boxes that topical medications come in in order to help keep track of the instructions about where and how  to use these. Pharmacies typically print the medication instructions only on the boxes and not directly on the medication tubes.   If your medication is too expensive, please contact our office at [phone] option 4 or send [place] a message through [place].   We are unable to tell what your co-pay for medications will be in advance as this is different depending on your insurance coverage. However, we may be able to find a substitute medication at lower cost or fill out paperwork to get insurance to cover a needed medication.   If a prior authorization is required to get your medication covered by your  insurance company, please allow [place] 1-2 business days to complete this process.  Drug prices often vary depending on where the prescription is filled and some pharmacies may offer cheaper prices.  The website www.goodrx.com contains coupons for medications through different pharmacies. The prices here do not account for what the cost may be with help from insurance (it may be cheaper with your insurance), but the website can give you the price if you did not use any insurance.  - You can print the associated coupon and take it with your prescription to the pharmacy.  - You may also stop by our office during regular business hours and pick up a GoodRx coupon card.  - If you need your prescription sent electronically to a different pharmacy, notify our office through [facility] or by phone at [phone] option 4.   Doxycycline should be taken with food to prevent nausea. Do not lay down for 30 minutes after taking. Be cautious with sun exposure and use good sun protection while on this medication. Pregnant women should not take this medication.   Start over the counter Panoxyl wash to groin area daily     Wound Care Instructions  1. Cleanse wound gently with soap and water once a day then pat dry with clean gauze. Apply a thing coat of Petrolatum (petroleum jelly, "Vaseline") over the wound (unless you have an allergy to this). We recommend that you use a new, sterile tube of Vaseline. Do not pick or remove scabs. Do not remove the yellow or white "healing tissue" from the base of the wound.  2. Cover the wound with fresh, clean, nonstick gauze and secure with paper tape. You may use Band-Aids in place of gauze and tape if the would is small enough, but would recommend trimming much of the tape off as there is often too much. Sometimes Band-Aids can irritate the skin.  3. You should call the office for your biopsy report after 1 week if you have not already been contacted.  4. If you  experience any problems, such as abnormal amounts of bleeding, swelling, significant bruising, significant pain, or evidence of infection, please call the office immediately.  5. FOR ADULT SURGERY PATIENTS: If you need something for pain relief you may take 1 extra strength Tylenol (acetaminophen) AND 2 Ibuprofen (200mg  each) together every 4 hours as needed for pain. (do not take these if you are allergic to them or if you have a reason you should not take them.) Typically, you may only need pain medication for 1 to 3 days.    Cryotherapy Aftercare  . Wash gently with soap and water everyday.   Marland Kitchen Apply Vaseline and Band-Aid daily until healed.

## 2021-01-19 LAB — HM DIABETES EYE EXAM

## 2021-01-23 ENCOUNTER — Telehealth: Payer: Self-pay

## 2021-01-23 NOTE — Telephone Encounter (Signed)
Advised pt of bx results.  Pt also asked about his upcoming ov for skin tag removal.  He was wondering if he had to pay a copay if he pays the $115 for skin tag removal.  I spoke to insurance department and advised pt if skin tags is the ONLY thing addressed in ov then he will not have a copay, but if they discuss anything other then skin tags he will be charged a copay./sh

## 2021-01-23 NOTE — Telephone Encounter (Signed)
-----   Message from Brendolyn Patty, MD sent at 01/20/2021 11:10 AM EDT ----- Skin , left spinal mid back EXCISION, NEUROFIBROMA, IRRITATED  This was shave removal, not excision Benign Neurofibroma

## 2021-03-22 ENCOUNTER — Encounter: Payer: Self-pay | Admitting: Family Medicine

## 2021-03-22 ENCOUNTER — Other Ambulatory Visit: Payer: Self-pay

## 2021-03-22 ENCOUNTER — Ambulatory Visit (INDEPENDENT_AMBULATORY_CARE_PROVIDER_SITE_OTHER): Payer: Managed Care, Other (non HMO) | Admitting: Family Medicine

## 2021-03-22 VITALS — BP 158/83 | HR 62 | Temp 97.9°F | Ht 67.0 in | Wt 255.6 lb

## 2021-03-22 DIAGNOSIS — R809 Proteinuria, unspecified: Secondary | ICD-10-CM

## 2021-03-22 DIAGNOSIS — R3914 Feeling of incomplete bladder emptying: Secondary | ICD-10-CM

## 2021-03-22 DIAGNOSIS — N401 Enlarged prostate with lower urinary tract symptoms: Secondary | ICD-10-CM

## 2021-03-22 DIAGNOSIS — H9193 Unspecified hearing loss, bilateral: Secondary | ICD-10-CM | POA: Diagnosis not present

## 2021-03-22 DIAGNOSIS — M4726 Other spondylosis with radiculopathy, lumbar region: Secondary | ICD-10-CM | POA: Diagnosis not present

## 2021-03-22 DIAGNOSIS — E1129 Type 2 diabetes mellitus with other diabetic kidney complication: Secondary | ICD-10-CM | POA: Diagnosis not present

## 2021-03-22 LAB — BAYER DCA HB A1C WAIVED: HB A1C (BAYER DCA - WAIVED): 5.7 % (ref <7.0)

## 2021-03-22 MED ORDER — TADALAFIL 5 MG PO TABS: 5.0000 mg | ORAL_TABLET | Freq: Every day | ORAL | 1 refills | Status: DC

## 2021-03-22 MED ORDER — TRAMADOL HCL 50 MG PO TABS: ORAL_TABLET | ORAL | 2 refills | Status: DC

## 2021-03-22 MED ORDER — METFORMIN HCL ER 500 MG PO TB24: 1000.0000 mg | ORAL_TABLET | Freq: Two times a day (BID) | ORAL | 1 refills | Status: DC

## 2021-03-22 NOTE — Assessment & Plan Note (Signed)
Under good control on current regimen. Continue current regimen. Continue to monitor. Call with any concerns. Refills given for 3 months. Follow up 3 months.    

## 2021-03-22 NOTE — Progress Notes (Signed)
BP (!) 158/83   Pulse 62   Temp 97.9 F (36.6 C)   Ht '5\' 7"'  (1.702 m)   Wt 255 lb 9.6 oz (115.9 kg)   SpO2 98%   BMI 40.03 kg/m    Subjective:    Patient ID: Andrew [name], male    DOB: 1956/01/20, 65 y.o.   MRN: [id number]  HPI: Andrew [name] is a 65 y.o. male  Chief Complaint  Patient presents with   Diabetes   Pain    Follow up chronic pain    Has been having issues hearing.   DIABETES Hypoglycemic episodes:no Polydipsia/polyuria: no Visual disturbance: no Chest pain: no Paresthesias: no Glucose Monitoring: no  Accucheck frequency: Not Checking Taking Insulin?: no Blood Pressure Monitoring: not checking Retinal Examination: Not up to Date Foot Exam: Up to Date Diabetic Education: Completed Pneumovax: Up to Date Influenza: Up to Date Aspirin: yes  BPH- stopped taking the flomax. He notes that he was having issues with ejaculation BPH status: better Satisfied with current treatment?: yes Medication side effects: yes Medication compliance: poor compliance Duration: chronic Nocturia: 1/night Urinary frequency:no Incomplete voiding: no Urgency: no Weak urinary stream: no Straining to start stream: no Dysuria: no Onset: gradual Severity: mild  CHRONIC PAIN  Present dose: 15 Morphine equivalents Pain control status: controlled Duration: chronic Location: back and shoulders Quality: aching and sore Current Pain Level: mild Previous Pain Level: severe Breakthrough pain: no Benefit from narcotic medications: yes What Activities task can be accomplished with current medication? Able to do his ADLs Interested in weaning off narcotics:no   Stool softners/OTC fiber: yes  Previous pain specialty evaluation: yes Non-narcotic analgesic meds: yes Narcotic contract: yes   Relevant past medical, surgical, family and social history reviewed and updated as indicated. Interim medical history since our last visit reviewed. Allergies and medications reviewed  and updated.  Review of Systems  Constitutional: Negative.   Respiratory: Negative.    Cardiovascular: Negative.   Gastrointestinal: Negative.   Musculoskeletal:  Positive for arthralgias, back pain and myalgias. Negative for gait problem, joint swelling, neck pain and neck stiffness.  Skin: Negative.   Psychiatric/Behavioral: Negative.     Per HPI unless specifically indicated above     Objective:    BP (!) 158/83   Pulse 62   Temp 97.9 F (36.6 C)   Ht '5\' 7"'  (1.702 m)   Wt 255 lb 9.6 oz (115.9 kg)   SpO2 98%   BMI 40.03 kg/m   Wt Readings from Last 3 Encounters:  03/22/21 255 lb 9.6 oz (115.9 kg)  12/26/20 259 lb (117.5 kg)  09/05/20 257 lb (116.6 kg)    Physical Exam Vitals and nursing note reviewed.  Constitutional:      General: He is not in acute distress.    Appearance: Normal appearance. He is not ill-appearing, toxic-appearing or diaphoretic.  HENT:     Head: Normocephalic and atraumatic.     Right Ear: External ear normal.     Left Ear: External ear normal.     Nose: Nose normal.     Mouth/Throat:     Mouth: Mucous membranes are moist.     Pharynx: Oropharynx is clear.  Eyes:     General: No scleral icterus.       Right eye: No discharge.        Left eye: No discharge.     Extraocular Movements: Extraocular movements intact.     Conjunctiva/sclera: Conjunctivae normal.  Pupils: Pupils are equal, round, and reactive to light.  Cardiovascular:     Rate and Rhythm: Normal rate and regular rhythm.     Pulses: Normal pulses.     Heart sounds: Normal heart sounds. No murmur heard.   No friction rub. No gallop.  Pulmonary:     Effort: Pulmonary effort is normal. No respiratory distress.     Breath sounds: Normal breath sounds. No stridor. No wheezing, rhonchi or rales.  Chest:     Chest wall: No tenderness.  Musculoskeletal:        General: Normal range of motion.     Cervical back: Normal range of motion and neck supple.  Skin:    General: Skin  is warm and dry.     Capillary Refill: Capillary refill takes less than 2 seconds.     Coloration: Skin is not jaundiced or pale.     Findings: No bruising, erythema, lesion or rash.  Neurological:     General: No focal deficit present.     Mental Status: He is alert and oriented to person, place, and time. Mental status is at baseline.  Psychiatric:        Mood and Affect: Mood normal.        Behavior: Behavior normal.        Thought Content: Thought content normal.        Judgment: Judgment normal.    Results for orders placed or performed in visit on 12/26/20  Bayer DCA Hb A1c Waived  Result Value Ref Range   HB A1C (BAYER DCA - WAIVED) 6.7 <7.0 %  Comprehensive metabolic panel  Result Value Ref Range   Glucose 220 (H) 65 - 99 mg/dL   BUN 30 (H) 8 - 27 mg/dL   Creatinine, Ser 1.29 (H) 0.76 - 1.27 mg/dL   eGFR 62 >59 mL/min/1.73   BUN/Creatinine Ratio 23 10 - 24   Sodium 141 134 - 144 mmol/L   Potassium 4.9 3.5 - 5.2 mmol/L   Chloride 103 96 - 106 mmol/L   CO2 19 (L) 20 - 29 mmol/L   Calcium 9.6 8.6 - 10.2 mg/dL   Total Protein 6.6 6.0 - 8.5 g/dL   Albumin 4.3 3.8 - 4.8 g/dL   Globulin, Total 2.3 1.5 - 4.5 g/dL   Albumin/Globulin Ratio 1.9 1.2 - 2.2   Bilirubin Total 0.6 0.0 - 1.2 mg/dL   Alkaline Phosphatase 97 44 - 121 IU/L   AST 31 0 - 40 IU/L   ALT 49 (H) 0 - 44 IU/L  CBC with Differential/Platelet  Result Value Ref Range   WBC 6.8 3.4 - 10.8 x10E3/uL   RBC 5.35 4.14 - 5.80 x10E6/uL   Hemoglobin 17.1 13.0 - 17.7 g/dL   Hematocrit 49.0 37.5 - 51.0 %   MCV 92 79 - 97 fL   MCH 32.0 26.6 - 33.0 pg   MCHC 34.9 31.5 - 35.7 g/dL   RDW 13.2 11.6 - 15.4 %   Platelets 177 150 - 450 x10E3/uL   Neutrophils 64 Not Estab. %   Lymphs 23 Not Estab. %   Monocytes 7 Not Estab. %   Eos 4 Not Estab. %   Basos 1 Not Estab. %   Neutrophils Absolute 4.3 1.4 - 7.0 x10E3/uL   Lymphocytes Absolute 1.6 0.7 - 3.1 x10E3/uL   Monocytes Absolute 0.5 0.1 - 0.9 x10E3/uL   EOS (ABSOLUTE)  0.3 0.0 - 0.4 x10E3/uL   Basophils Absolute 0.1 0.0 - 0.2 x10E3/uL  Immature Granulocytes 1 Not Estab. %   Immature Grans (Abs) 0.0 0.0 - 0.1 x10E3/uL  PSA  Result Value Ref Range   Prostate Specific Ag, Serum 0.8 0.0 - 4.0 ng/mL  Lipid Panel w/o Chol/HDL Ratio  Result Value Ref Range   Cholesterol, Total 209 (H) 100 - 199 mg/dL   Triglycerides 234 (H) 0 - 149 mg/dL   HDL 33 (L) >39 mg/dL   VLDL Cholesterol Cal 42 (H) 5 - 40 mg/dL   LDL Chol Calc (NIH) 134 (H) 0 - 99 mg/dL  Testosterone, Free, Total, SHBG  Result Value Ref Range   Testosterone 89 (L) 264 - 916 ng/dL   Testosterone, Free 3.5 (L) 6.6 - 18.1 pg/mL   Sex Hormone Binding 25.7 19.3 - 76.4 nmol/L      Assessment & Plan:   Problem List Items Addressed This Visit       Endocrine   Controlled type 2 diabetes mellitus with microalbuminuria (HCC)    Under good control with A1c of 5.7. Continue diet and exercise. Continue current regimen. Call with any concerns.        Relevant Medications   metFORMIN (GLUCOPHAGE-XR) 500 MG 24 hr tablet   Other Relevant Orders   Bayer DCA Hb A1c Waived     Musculoskeletal and Integument   Degenerative joint disease (DJD) of lumbar spine    Under good control on current regimen. Continue current regimen. Continue to monitor. Call with any concerns. Refills given for 3 months. Follow up 3 months.         Relevant Medications   traMADol (ULTRAM) 50 MG tablet     Genitourinary   Benign prostatic hyperplasia with incomplete bladder emptying - Primary    Could not tolerate flomax. Will try daily cialis and recheck 3 months. Call with any concerns.        Other Visit Diagnoses     Bilateral hearing loss, unspecified hearing loss type       Referral to audiology made today.   Relevant Orders   Ambulatory referral to Audiology        Follow up plan: Return in about 3 months (around 06/22/2021).

## 2021-03-22 NOTE — Assessment & Plan Note (Signed)
Could not tolerate flomax. Will try daily cialis and recheck 3 months. Call with any concerns.

## 2021-03-22 NOTE — Assessment & Plan Note (Signed)
Under good control with A1c of 5.7. Continue diet and exercise. Continue current regimen. Call with any concerns.

## 2021-03-23 ENCOUNTER — Telehealth: Payer: Self-pay

## 2021-03-23 NOTE — Telephone Encounter (Signed)
Initiated PA via CoverMyMeds for Tadalafil 5MG  tablets.  Key: [id number] PA denied- "Your patient will pay 100% of a discounted price for this medication. Any amount the patient pays will not apply to their deductible or out-of-pocket expenses."

## 2021-03-23 NOTE — Telephone Encounter (Signed)
Called patient to make aware of PA denial. No answer unable to leave VM .

## 2021-03-28 ENCOUNTER — Other Ambulatory Visit: Payer: Self-pay

## 2021-03-28 ENCOUNTER — Ambulatory Visit (INDEPENDENT_AMBULATORY_CARE_PROVIDER_SITE_OTHER): Payer: Managed Care, Other (non HMO) | Admitting: Dermatology

## 2021-03-28 DIAGNOSIS — L732 Hidradenitis suppurativa: Secondary | ICD-10-CM

## 2021-03-28 DIAGNOSIS — I781 Nevus, non-neoplastic: Secondary | ICD-10-CM | POA: Diagnosis not present

## 2021-03-28 DIAGNOSIS — D692 Other nonthrombocytopenic purpura: Secondary | ICD-10-CM | POA: Diagnosis not present

## 2021-03-28 DIAGNOSIS — L918 Other hypertrophic disorders of the skin: Secondary | ICD-10-CM

## 2021-03-28 MED ORDER — DOXYCYCLINE MONOHYDRATE 100 MG PO CAPS: ORAL_CAPSULE | ORAL | 3 refills | Status: DC

## 2021-03-28 MED ORDER — CLINDAMYCIN PHOSPHATE 1 % EX SOLN: CUTANEOUS | 3 refills | Status: DC

## 2021-03-28 NOTE — Patient Instructions (Addendum)
Skin Tag Removal Wound Care Instructions  Sometimes bandages are not necessary.  If a bandage is used, leave the original bandage on for 24 hours if possible.  If the bandage becomes soaked or soiled before that time, it is OK to remove it and examine the wound.  A small amount of post-operative bleeding is normal.  If excessive bleeding occurs, remove the bandage, place gauze over the site and apply continuous pressure (no peeking) over the area for 20-30 minutes.  If this does not stop the bleeding, try again for 40 minutes.  If this does not work, please call our clinic as soon as possible (even if after hours).    Once a day, cleanse the wound with soap and water.  If a thick crust develops you may use a Q-tip dipped into dilute hydrogen peroxide (mix 1:1 with water) to dissolve it.  Hydrogen peroxide can slow the healing process, so use it only as needed.  After washing, apply Vaseline jelly or Polysporin ointment.  For best healing, the wound should be covered with a layer of ointment at all times.  This may mean re-applying the ointment several times a day.  For open wounds, continue until it has healed.    If you have any swelling, keep the area elevated.  Some redness, tenderness and white or yellow material in the wound is normal healing.  If the area becomes very sore and red, or develops a thick yellow-green material (pus), it may be infected; please notify us.    Wound healing continues for up to one year following surgery.  It is not unusual to experience pain in the scar from time to time during the interval.  If the pain becomes severe or the scar thickens, you should notify the office.  A slight amount of redness in a scar is expected for the first six months.  After six months, the redness subsides and the scar will soften and fade.  The color difference becomes less noticeable with time.  If there are any problems, return for a post-op surgery check at your earliest  convenience.  It is important to wear sunscreen daily with an SPF of 30 or higher over the treated sites and to wear sun-protective clothing.  Please call our office at [phone] for any questions or concerns.  Recommend daily broad spectrum sunscreen SPF 30+ to sun-exposed areas, reapply every 2 hours as needed. Call for new or changing lesions.  Staying in the shade or wearing long sleeves, sun glasses (UVA+UVB protection) and wide brim hats (4-inch brim around the entire circumference of the hat) are also recommended for sun protection.           If your medication is too expensive, please contact our office at [phone] option 4 or send [place] a message through [place].   We are unable to tell what your co-pay for medications will be in advance as this is different depending on your insurance coverage. However, we may be able to find a substitute medication at lower cost or fill out paperwork to get insurance to cover a needed medication.   If a prior authorization is required to get your medication covered by your insurance company, please allow [place] 1-2 business days to complete this process.  Drug prices often vary depending on where the prescription is filled and some pharmacies may offer cheaper prices.  The website www.goodrx.com contains coupons for medications through different pharmacies. The prices here do not account for what  the cost may be with help from insurance (it may be cheaper with your insurance), but the website can give you the price if you did not use any insurance.  - You can print the associated coupon and take it with your prescription to the pharmacy.  - You may also stop by our office during regular business hours and pick up a GoodRx coupon card.  - If you need your prescription sent electronically to a different pharmacy, notify our office through [facility] or by phone at [phone] option 4.

## 2021-03-28 NOTE — Progress Notes (Signed)
Follow-Up Visit   Subjective  Andrew [name] is a 65 y.o. male who presents for the following: hidradenitis suppurativa  (Patient here today for 2 month follow up. He is continuing to use doxycycline, clindamycin solution and panoxyl body was. Patient also here today for skin tag removal. He states he has several on neck, bilateral axilla, and left groin area he would like removed. ).   The following portions of the chart were reviewed this encounter and updated as appropriate:        Objective  Well appearing patient in no apparent distress; mood and affect are within normal limits.  A focused examination was performed including groin, neck, bilateral axilla, left and right inner thigh. Relevant physical exam findings are noted in the Assessment and Plan.  left and right axilla, left and right neck, left and right groin Fleshy, skin-colored pedunculated papules.      groin, axilla Extensive scarring in groin, axilla, no active lesions today  Assessment & Plan  Skin tag left and right axilla, left and right neck, left and right groin  Cosmetic skin tag removal today Procedure: Skin tag removal Informed consent:  Discussed risks (permanent scarring, infection, pain, bleeding, bruising, redness, and recurrence of the lesion) and benefits of the procedure, as well as the alternatives.  Patient is aware that skin tags are benign lesions, and their removal is often not considered medically necessary.  Informed consent was obtained. The area was prepared with isopropyl alcohol. Anesthesia:  bupivacaine 1% with epinephrine was injected to achieve good local anesthesia Snip removal was performed.   Antibiotic ointment was applied.   The patient tolerated procedure well. The patient was instructed on post-op care.   Number of lesions removed:  25 Left neck x 5 left axilla x 5 , left groin x 4 right groin x 2, right axilla x 5, right neck x 4   Hidradenitis suppurativa groin,  axilla  Improving on treatment  Hidradenitis Suppurativa is a chronic; persistent; non-curable, but treatable condition due to abnormal inflamed sweat glands in the body folds (axilla, inframammary, groin, medial thighs), causing recurrent painful cysts and scarring. It can be associated with severe scarring acne and cysts; abscesses and scarring of scalp. The goal is control and prevention of flares, as it is not curable. Scars are permanent and can be thickened. Treatment may include daily use of topical medication and oral antibiotics.  Oral isotretinoin may also be helpful.  For more severe cases, Humira (a biologic injection) may be prescribed to decrease the inflammatory process and prevent flares.  When indicated, inflamed cysts may also be treated surgically.     Continue Doxycycline 100 mg take 1 tablet twice a day   Doxycycline should be taken with food to prevent nausea. Do not lay down for 30 minutes after taking. Be cautious with sun exposure and use good sun protection while on this medication. Pregnant women should not take this medication.     Continue Clindamycin solution apply to groin/axilla qd/bid   Continue otc 4% Panoxyl wash or CeraVe acne foaming wash apply to skin daily in shower and rinse after several minutes   May start warm compresses if cyst become inflamed If cyst remains symptomatic, recommend surgery removal  clindamycin (CLEOCIN T) 1 % external solution - groin, axilla Apply to affected areas groin daily after shower  doxycycline (MONODOX) 100 MG capsule - groin, axilla Take 1 tablet twice a day with food and full glass of water  Telangiectasia -  Dilated blood vessel on nose and left cheek - Benign appearing on exam Patient would like to have vascular laser treatment. Will discuss at next follow up since we don't have a laser technician at this time - Call for changes  Purpura - Chronic; persistent and recurrent.  Treatable, but not curable. -  Violaceous macules and patches on bilateral arms  - Benign - Related to trauma, age, sun damage and/or use of blood thinners, chronic use of topical and/or oral steroids - Observe - Can use OTC arnica containing moisturizer such as Dermend Bruise Formula if desired - Call for worsening or other concerns  Return for HS f/up. I, Ruthell Rummage, CMA, am acting as scribe for Brendolyn Patty, MD.  Documentation: I have reviewed the above documentation for accuracy and completeness, and I agree with the above.  Brendolyn Patty MD

## 2021-04-23 ENCOUNTER — Other Ambulatory Visit: Payer: Self-pay | Admitting: Family Medicine

## 2021-04-23 NOTE — Telephone Encounter (Signed)
dc'd 03/22/21 Dr Wynetta Emery

## 2021-06-01 ENCOUNTER — Other Ambulatory Visit: Payer: Self-pay | Admitting: Family Medicine

## 2021-06-01 NOTE — Telephone Encounter (Signed)
Requested Prescriptions  Pending Prescriptions Disp Refills  . metFORMIN (GLUCOPHAGE-XR) 500 MG 24 hr tablet [Pharmacy Med Name: METFORMIN ER 500MG 24HR TABS] 360 tablet 0    Sig: TAKE 2 TABLETS(1000 MG) BY MOUTH TWICE DAILY     Endocrinology:  Diabetes - Biguanides Failed - 06/01/2021  3:33 AM      Failed - Cr in normal range and within 360 days    Creatinine, Ser  Date Value Ref Range Status  12/26/2020 1.29 (H) 0.76 - 1.27 mg/dL Final         Passed - HBA1C is between 0 and 7.9 and within 180 days    HB A1C (BAYER DCA - WAIVED)  Date Value Ref Range Status  03/22/2021 5.7 <7.0 % Final    Comment:                                          Diabetic Adult            <7.0                                       Healthy Adult        4.3 - 5.7                                                           (DCCT/NGSP) American Diabetes Association's Summary of Glycemic Recommendations for Adults with Diabetes: Hemoglobin A1c <7.0%. More stringent glycemic goals (A1c <6.0%) may further reduce complications at the cost of increased risk of hypoglycemia.          Passed - eGFR in normal range and within 360 days    GFR calc Af Amer  Date Value Ref Range Status  05/30/2020 96 >59 mL/min/1.73 Final    Comment:    **[facility] currently reports eGFR in compliance with the current**   recommendations of the [employer]. [facility] will   update reporting as new guidelines are published from the NKF-ASN   Task force.    GFR calc non Af Amer  Date Value Ref Range Status  05/30/2020 83 >59 mL/min/1.73 Final   eGFR  Date Value Ref Range Status  12/26/2020 62 >59 mL/min/1.73 Final         Passed - Valid encounter within last 6 months    Recent Outpatient Visits          2 months ago Benign prostatic hyperplasia with incomplete bladder emptying   [place], Megan P, DO   5 months ago Uncontrolled type 2 diabetes mellitus with hyperglycemia ([place])   [place], Megan P, DO   8 months ago Uncontrolled type 2 diabetes mellitus with hyperglycemia ([place])   [place], Megan P, DO   1 year ago Routine general medical examination at a health care facility   [facility], [place], DO   1 year ago Uncontrolled type 2 diabetes mellitus with hyperglycemia [facility])   [facility] Valerie Roys, DO      Future Appointments  In 4 weeks Valerie Roys, DO [place], [place]   In 1 month Brendolyn Patty, MD [place]

## 2021-06-02 ENCOUNTER — Other Ambulatory Visit: Payer: Self-pay | Admitting: Family Medicine

## 2021-06-02 NOTE — Telephone Encounter (Signed)
Requested medication (s) are due for refill today: yes  Requested medication (s) are on the active medication list: yes  Last refill:  03/22/21 #90 2 refills  Future visit scheduled: yes   Notes to clinic:  not delegated per protocol     Requested Prescriptions  Pending Prescriptions Disp Refills   traMADol (ULTRAM) 50 MG tablet [Pharmacy Med Name: TRAMADOL '50MG'$  TABLETS] 90 tablet     Sig: TAKE 2 TABLETS BY MOUTH EVERY MORNING AND 1 TABLET EVERY EVENING     Not Delegated - Analgesics:  Opioid Agonists Failed - 06/02/2021  8:16 AM      Failed - This refill cannot be delegated      Failed - Urine Drug Screen completed in last 360 days      Passed - Valid encounter within last 6 months    Recent Outpatient Visits           2 months ago Benign prostatic hyperplasia with incomplete bladder emptying   [place], Megan P, DO   5 months ago Uncontrolled type 2 diabetes mellitus with hyperglycemia ([place])   [place], Megan P, DO   9 months ago Uncontrolled type 2 diabetes mellitus with hyperglycemia ([place])   [place], Megan P, DO   1 year ago Routine general medical examination at a health care facility   [facility], [place], DO   1 year ago Uncontrolled type 2 diabetes mellitus with hyperglycemia [facility])   [facility] Valerie Roys, DO       Future Appointments             In 3 weeks Wynetta Emery, Barb Merino, DO [place], [place]   In 1 month Brendolyn Patty, MD [place]

## 2021-06-10 ENCOUNTER — Other Ambulatory Visit: Payer: Self-pay | Admitting: Family Medicine

## 2021-06-10 NOTE — Telephone Encounter (Signed)
Requested Prescriptions  Pending Prescriptions Disp Refills  . gabapentin (NEURONTIN) 300 MG capsule [Pharmacy Med Name: GABAPENTIN '300MG'$  CAPSULES] 360 capsule 0    Sig: TAKE 2 CAPSULES(600 MG) BY MOUTH TWICE DAILY     Neurology: Anticonvulsants - gabapentin Passed - 06/10/2021  3:34 AM      Passed - Valid encounter within last 12 months    Recent Outpatient Visits          2 months ago Benign prostatic hyperplasia with incomplete bladder emptying   [place], Megan P, DO   5 months ago Uncontrolled type 2 diabetes mellitus with hyperglycemia ([place])   [facility], Megan P, DO   9 months ago Uncontrolled type 2 diabetes mellitus with hyperglycemia ([place])   [place], Griffin P, DO   1 year ago Routine general medical examination at a health care facility   [facility], [place], DO   1 year ago Uncontrolled type 2 diabetes mellitus with hyperglycemia [facility])   [facility] Valerie Roys, DO      Future Appointments            In 2 weeks Wynetta Emery, Barb Merino, DO [place], [place]   In 1 month Brendolyn Patty, MD [place]

## 2021-06-27 ENCOUNTER — Ambulatory Visit: Payer: Managed Care, Other (non HMO) | Admitting: Family Medicine

## 2021-06-29 ENCOUNTER — Ambulatory Visit: Payer: Managed Care, Other (non HMO) | Admitting: Family Medicine

## 2021-06-29 NOTE — Progress Notes (Signed)
Acute Office Visit  Subjective:    Patient ID: Andrew [name], male    DOB: 04-Aug-1956, 65 y.o.   MRN: [id number]  Chief Complaint  Patient presents with   Covid Positive    Tested positive at home on Wednesday. Symptoms: congestion, head congestion, body aches.     HPI Patient is in today for body aches, congestion since Wednesday. He had a positive home covid-19 test on Wednesday.   UPPER RESPIRATORY TRACT INFECTION  Worst symptom: body aches Fever: yes Cough: yes Shortness of breath: no Wheezing: no Chest pain: no Chest tightness: no Chest congestion: yes Nasal congestion: yes Runny nose: no Post nasal drip: no Sneezing: no Sore throat: yes Swollen glands: no Sinus pressure: no Headache: no Face pain: no Toothache: no Ear pain: no  Ear pressure: no  Eyes red/itching:no Eye drainage/crusting: no  Vomiting: no Rash: no Fatigue: yes Sick contacts: yes - wife had it Monday Strep contacts: no  Context: better Recurrent sinusitis: no Relief with OTC cold/cough medications: yes  Treatments attempted: cold/sinus    Past Medical History:  Diagnosis Date   Chronic pain syndrome    Depression    Diverticulosis    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, was on anticoags for 59month   Ear infection 08/2016   CLEARING UP-FINISHED ANTIBIOTIC AND PREDNISONE TAPER    Family history of adverse reaction to anesthesia    BROTHER-N/V   Hernia, umbilical    History of kidney stones    Hyperlipidemia    Hypertension    IFG (impaired fasting glucose)    Knee pain, chronic    Pes anserine bursitis     Past Surgical History:  Procedure Laterality Date   COLONOSCOPY  2015   JOINT REPLACEMENT Bilateral 2009   replacement and revision   KIDNEY STONE SURGERY     SHOULDER ARTHROSCOPY WITH ROTATOR CUFF REPAIR Right    UMBILICAL HERNIA REPAIR N/A 09/06/2016   Procedure: HERNIA REPAIR UMBILICAL ADULT;  Surgeon: BRobert Bellow MD;  Location: [facility] ORS;   Service: General;  Laterality: N/A;    Family History  Problem Relation Age of Onset   Alcohol abuse Father    Hypertension Father    Cirrhosis Father    Hypertension Brother    Stroke Paternal Grandmother    Stroke Paternal Grandfather    Pneumonia Mother     Social History   Socioeconomic History   Marital status: Married    Spouse name: Not on file   Number of children: Not on file   Years of education: Not on file   Highest education level: Not on file  Occupational History   Not on file  Tobacco Use   Smoking status: Former    Packs/day: 1.50    Years: 25.00    Pack years: 37.50    Types: Cigarettes    Quit date: 06/28/2011    Years since quitting: 10.0   Smokeless tobacco: Never  Vaping Use   Vaping Use: Never used  Substance and Sexual Activity   Alcohol use: Yes    Comment: on occasion   Drug use: No   Sexual activity: Yes    Birth control/protection: None  Other Topics Concern   Not on file  Social History Narrative   Not on file   Social Determinants of Health   Financial Resource Strain: Not on file  Food Insecurity: Not on file  Transportation Needs: Not on file  Physical Activity: Not on  file  Stress: Not on file  Social Connections: Not on file  Intimate Partner Violence: Not on file    Outpatient Medications Prior to Visit  Medication Sig Dispense Refill   benazepril (LOTENSIN) 40 MG tablet Take 1 tablet (40 mg total) by mouth daily. 90 tablet 1   buPROPion (WELLBUTRIN SR) 150 MG 12 hr tablet Take 1 tablet (150 mg total) by mouth 2 (two) times daily. 180 tablet 1   clindamycin (CLEOCIN T) 1 % external solution Apply to affected areas groin daily after shower 30 mL 3   diclofenac (VOLTAREN) 75 MG EC tablet TAKE 1 TABLET BY MOUTH  TWICE DAILY 180 tablet 3   doxycycline (MONODOX) 100 MG capsule Take 1 tablet twice a day with food and full glass of water 60 capsule 3   gabapentin (NEURONTIN) 300 MG capsule TAKE 2 CAPSULES(600 MG) BY MOUTH  TWICE DAILY 360 capsule 0   hydrochlorothiazide (MICROZIDE) 12.5 MG capsule Take 1 capsule (12.5 mg total) by mouth daily. 90 capsule 1   metFORMIN (GLUCOPHAGE-XR) 500 MG 24 hr tablet TAKE 2 TABLETS(1000 MG) BY MOUTH TWICE DAILY 360 tablet 0   metoprolol succinate (TOPROL-XL) 100 MG 24 hr tablet TAKE 1 TABLET BY MOUTH  DAILY WITH OR IMMEDIATELY  FOLLOWING A MEAL 90 tablet 1   tadalafil (CIALIS) 5 MG tablet Take 1 tablet (5 mg total) by mouth daily. 90 tablet 1   testosterone cypionate (DEPOTESTOSTERONE CYPIONATE) 200 MG/ML injection ADMINISTER 1 ML(200 MG) IN THE MUSCLE EVERY 14 DAYS 2 mL 5   traMADol (ULTRAM) 50 MG tablet 2 tabs in the AM and 1 tab in the PM 90 tablet 2   No facility-administered medications prior to visit.    Allergies  Allergen Reactions   Other Other (See Comments)    Adhesive tape gave blisters    Review of Systems  Constitutional:  Positive for fatigue and fever.  HENT:  Positive for congestion, rhinorrhea and sore throat. Negative for ear pain, postnasal drip and sinus pressure.   Eyes: Negative.   Respiratory:  Positive for cough.   Cardiovascular: Negative.   Gastrointestinal: Negative.   Genitourinary: Negative.   Musculoskeletal:  Positive for myalgias.  Skin: Negative.   Neurological: Negative.       Objective:    Physical Exam Vitals and nursing note reviewed.  Constitutional:      General: He is not in acute distress.    Appearance: Normal appearance.  HENT:     Head: Normocephalic.  Eyes:     Conjunctiva/sclera: Conjunctivae normal.  Pulmonary:     Effort: Pulmonary effort is normal.     Comments: Able to talk in complete sentences Neurological:     Mental Status: He is alert and oriented to person, place, and time.  Psychiatric:        Mood and Affect: Mood normal.        Behavior: Behavior normal.        Thought Content: Thought content normal.        Judgment: Judgment normal.    BP (!) 137/94   Pulse 70   Temp 97.7 F (36.5  C) (Oral)  Wt Readings from Last 3 Encounters:  03/22/21 255 lb 9.6 oz (115.9 kg)  12/26/20 259 lb (117.5 kg)  09/05/20 257 lb (116.6 kg)    Health Maintenance Due  Topic Date Due   OPHTHALMOLOGY EXAM  Never done   Zoster Vaccines- Shingrix (1 of 2) Never done   COVID-19 Vaccine (3 -  Moderna risk series) 08/10/2020   INFLUENZA VACCINE  05/01/2021    There are no preventive care reminders to display for this patient.   Lab Results  Component Value Date   TSH 4.350 05/30/2020   Lab Results  Component Value Date   WBC 6.8 12/26/2020   HGB 17.1 12/26/2020   HCT 49.0 12/26/2020   MCV 92 12/26/2020   PLT 177 12/26/2020   Lab Results  Component Value Date   NA 141 12/26/2020   K 4.9 12/26/2020   CO2 19 (L) 12/26/2020   GLUCOSE 220 (H) 12/26/2020   BUN 30 (H) 12/26/2020   CREATININE 1.29 (H) 12/26/2020   BILITOT 0.6 12/26/2020   ALKPHOS 97 12/26/2020   AST 31 12/26/2020   ALT 49 (H) 12/26/2020   PROT 6.6 12/26/2020   ALBUMIN 4.3 12/26/2020   CALCIUM 9.6 12/26/2020   EGFR 62 12/26/2020   Lab Results  Component Value Date   CHOL 209 (H) 12/26/2020   Lab Results  Component Value Date   HDL 33 (L) 12/26/2020   Lab Results  Component Value Date   LDLCALC 134 (H) 12/26/2020   Lab Results  Component Value Date   TRIG 234 (H) 12/26/2020   No results found for: [facility] Lab Results  Component Value Date   HGBA1C 5.7 03/22/2021       Assessment & Plan:   Problem List Items Addressed This Visit   None Visit Diagnoses     COVID-19    -  Primary   Treat with molnupriavir. Discussed isolation. Can contine alkaseltzer cold and flu. Encouraged fluids and rest. F/U if symptoms worsen   Relevant Medications   molnupiravir EUA (LAGEVRIO) 200 mg CAPS capsule        Meds ordered this encounter  Medications   molnupiravir EUA (LAGEVRIO) 200 mg CAPS capsule    Sig: Take 4 capsules (800 mg total) by mouth 2 (two) times daily for 5 days.    Dispense:  40  capsule    Refill:  0     This visit was completed via MyChart due to the restrictions of the COVID-19 pandemic. All issues as above were discussed and addressed. Physical exam was done as above through visual confirmation on MyChart. If it was felt that the patient should be evaluated in the office, they were directed there. The patient verbally consented to this visit. Location of the patient: home Location of the provider: work Those involved with this call:  Provider: Vance Peper, NP CMA: Frazier Butt, [place] Desk/Registration: Myrlene Broker  Time spent on call:  10 minutes with patient face to face via video conference. More than 50% of this time was spent in counseling and coordination of care. 10 minutes total spent in review of patient's record and preparation of their chart.   Charyl Dancer, NP

## 2021-06-30 ENCOUNTER — Encounter: Payer: Self-pay | Admitting: Nurse Practitioner

## 2021-06-30 ENCOUNTER — Telehealth (INDEPENDENT_AMBULATORY_CARE_PROVIDER_SITE_OTHER): Payer: Managed Care, Other (non HMO) | Admitting: Nurse Practitioner

## 2021-06-30 VITALS — BP 137/94 | HR 70 | Temp 97.7°F

## 2021-06-30 DIAGNOSIS — U071 COVID-19: Secondary | ICD-10-CM | POA: Diagnosis not present

## 2021-06-30 MED ORDER — MOLNUPIRAVIR EUA 200MG CAPSULE: 4.0000 | ORAL_CAPSULE | Freq: Two times a day (BID) | ORAL | 0 refills | Status: AC

## 2021-07-09 ENCOUNTER — Other Ambulatory Visit: Payer: Self-pay | Admitting: Family Medicine

## 2021-07-09 NOTE — Telephone Encounter (Signed)
Requested medication (s) are due for refill today: yes  Requested medication (s) are on the active medication list: yes  Last refill:  12/26/20   Future visit scheduled: yes  Notes to clinic:  med not assigned to a protocol   Requested Prescriptions  Pending Prescriptions Disp Refills   testosterone cypionate (DEPOTESTOSTERONE CYPIONATE) 200 MG/ML injection [Pharmacy Med Name: TESTOSTERONE CYP 200MG /ML SDV 1ML] 2 mL     Sig: ADMINISTER 1 ML(200 MG) IN THE MUSCLE EVERY 14 DAYS     Off-Protocol Failed - 07/09/2021  3:16 PM      Failed - Medication not assigned to a protocol, review manually.      Passed - Valid encounter within last 12 months    Recent Outpatient Visits           1 week ago COVID-19   [facility], Lauren A, NP   3 months ago Benign prostatic hyperplasia with incomplete bladder emptying   [facility] [place], Megan P, DO   6 months ago Uncontrolled type 2 diabetes mellitus with hyperglycemia [facility])   [place], Megan P, DO   10 months ago Uncontrolled type 2 diabetes mellitus with hyperglycemia ([place])   [place], Megan P, DO   1 year ago Routine general medical examination at a health care facility   [facility] Valerie Roys, DO       Future Appointments             In 3 days Valerie Roys, DO [place], [place]   In 2 weeks Brendolyn Patty, MD [place]

## 2021-07-12 ENCOUNTER — Ambulatory Visit: Payer: Managed Care, Other (non HMO) | Admitting: Family Medicine

## 2021-07-17 ENCOUNTER — Other Ambulatory Visit: Payer: Self-pay

## 2021-07-17 ENCOUNTER — Encounter: Payer: Self-pay | Admitting: Family Medicine

## 2021-07-17 ENCOUNTER — Ambulatory Visit (INDEPENDENT_AMBULATORY_CARE_PROVIDER_SITE_OTHER): Payer: Managed Care, Other (non HMO) | Admitting: Family Medicine

## 2021-07-17 VITALS — BP 133/83 | HR 55 | Ht 67.0 in | Wt 242.0 lb

## 2021-07-17 DIAGNOSIS — M4726 Other spondylosis with radiculopathy, lumbar region: Secondary | ICD-10-CM

## 2021-07-17 DIAGNOSIS — Z23 Encounter for immunization: Secondary | ICD-10-CM

## 2021-07-17 MED ORDER — DICLOFENAC SODIUM 75 MG PO TBEC: 75.0000 mg | DELAYED_RELEASE_TABLET | Freq: Two times a day (BID) | ORAL | 3 refills | Status: DC

## 2021-07-17 MED ORDER — BENAZEPRIL HCL 40 MG PO TABS: 40.0000 mg | ORAL_TABLET | Freq: Every day | ORAL | 1 refills | Status: DC

## 2021-07-17 MED ORDER — METFORMIN HCL ER 500 MG PO TB24: ORAL_TABLET | ORAL | 1 refills | Status: DC

## 2021-07-17 MED ORDER — METOPROLOL SUCCINATE ER 100 MG PO TB24: ORAL_TABLET | ORAL | 1 refills | Status: DC

## 2021-07-17 MED ORDER — HYDROCHLOROTHIAZIDE 12.5 MG PO CAPS: 12.5000 mg | ORAL_CAPSULE | Freq: Every day | ORAL | 1 refills | Status: DC

## 2021-07-17 MED ORDER — GABAPENTIN 300 MG PO CAPS: ORAL_CAPSULE | ORAL | 1 refills | Status: DC

## 2021-07-17 MED ORDER — TADALAFIL 5 MG PO TABS: 5.0000 mg | ORAL_TABLET | Freq: Every day | ORAL | 1 refills | Status: DC

## 2021-07-17 MED ORDER — TRAMADOL HCL 50 MG PO TABS: ORAL_TABLET | ORAL | 2 refills | Status: DC

## 2021-07-17 MED ORDER — BUPROPION HCL ER (SR) 150 MG PO TB12: 150.0000 mg | ORAL_TABLET | Freq: Two times a day (BID) | ORAL | 1 refills | Status: DC

## 2021-07-17 NOTE — Assessment & Plan Note (Signed)
Under good control on current regimen. Continue current regimen. Continue to monitor. Call with any concerns. Refills given for 3 months. Follow up 3 months.    

## 2021-07-17 NOTE — Progress Notes (Signed)
BP 133/83   Pulse (!) 55   Wt 242 lb (109.8 kg)   BMI 37.90 kg/m    Subjective:    Patient ID: Andrew [name], male    DOB: 1956/03/20, 65 y.o.   MRN: [id number]  HPI: Andrew [name] is a 65 y.o. male  Chief Complaint  Patient presents with   Pain   CHRONIC PAIN  Present dose: 15 Morphine equivalents Pain control status: stable Duration: chronic Location: back and legs Quality: aching and sore Current Pain Level: moderate Previous Pain Level: severe Breakthrough pain: yes Benefit from narcotic medications: yes What Activities task can be accomplished with current medication? Able to do ADLs Interested in weaning off narcotics:yes   Stool softners/OTC fiber: no  Previous pain specialty evaluation: yes Non-narcotic analgesic meds: yes Narcotic contract: yes  Relevant past medical, surgical, family and social history reviewed and updated as indicated. Interim medical history since our last visit reviewed. Allergies and medications reviewed and updated.  Review of Systems  Constitutional: Negative.   Respiratory: Negative.    Cardiovascular: Negative.   Gastrointestinal: Negative.   Musculoskeletal:  Positive for arthralgias, back pain, gait problem and myalgias. Negative for joint swelling, neck pain and neck stiffness.  Psychiatric/Behavioral: Negative.     Per HPI unless specifically indicated above     Objective:    BP 133/83   Pulse (!) 55   Wt 242 lb (109.8 kg)   BMI 37.90 kg/m   Wt Readings from Last 3 Encounters:  07/17/21 242 lb (109.8 kg)  03/22/21 255 lb 9.6 oz (115.9 kg)  12/26/20 259 lb (117.5 kg)    Physical Exam Vitals and nursing note reviewed.  Constitutional:      General: He is not in acute distress.    Appearance: Normal appearance. He is not ill-appearing, toxic-appearing or diaphoretic.  HENT:     Head: Normocephalic and atraumatic.     Right Ear: External ear normal.     Left Ear: External ear normal.     Nose: Nose normal.      Mouth/Throat:     Mouth: Mucous membranes are moist.     Pharynx: Oropharynx is clear.  Eyes:     General: No scleral icterus.       Right eye: No discharge.        Left eye: No discharge.     Extraocular Movements: Extraocular movements intact.     Conjunctiva/sclera: Conjunctivae normal.     Pupils: Pupils are equal, round, and reactive to light.  Cardiovascular:     Rate and Rhythm: Normal rate and regular rhythm.     Pulses: Normal pulses.     Heart sounds: Normal heart sounds. No murmur heard.   No friction rub. No gallop.  Pulmonary:     Effort: Pulmonary effort is normal. No respiratory distress.     Breath sounds: Normal breath sounds. No stridor. No wheezing, rhonchi or rales.  Chest:     Chest wall: No tenderness.  Musculoskeletal:        General: Normal range of motion.     Cervical back: Normal range of motion and neck supple.  Skin:    General: Skin is warm and dry.     Capillary Refill: Capillary refill takes less than 2 seconds.     Coloration: Skin is not jaundiced or pale.     Findings: No bruising, erythema, lesion or rash.  Neurological:     General: No focal deficit present.  Mental Status: He is alert and oriented to person, place, and time. Mental status is at baseline.  Psychiatric:        Mood and Affect: Mood normal.        Behavior: Behavior normal.        Thought Content: Thought content normal.        Judgment: Judgment normal.    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 Continue current regimen. Continue to monitor. Call with any concerns. Refills given for 3 months. Follow up 3 months.        Relevant Medications    diclofenac (VOLTAREN) 75 MG EC tablet   traMADol (ULTRAM) 50 MG tablet   Other Visit Diagnoses     Need for immunization against influenza       Relevant Orders   Flu Vaccine QUAD 63mo+IM (Fluarix, Fluzone & Alfiuria Quad PF) (Completed)        Follow up plan: Return in about 3 months (around 10/17/2021), or physical.

## 2021-07-25 ENCOUNTER — Ambulatory Visit: Payer: Managed Care, Other (non HMO) | Admitting: Dermatology

## 2021-08-03 ENCOUNTER — Other Ambulatory Visit: Payer: Self-pay | Admitting: Family Medicine

## 2021-08-03 NOTE — Telephone Encounter (Signed)
Requested medication (s) are due for refill today:   Provider to review  Requested medication (s) are on the active medication list:   Yes  Future visit scheduled:   Yes   Last ordered: 12/26/2020 34ml, 5 refills  Returned because it's a non delegated refill, no protocol for it.   Requested Prescriptions  Pending Prescriptions Disp Refills   testosterone cypionate (DEPOTESTOSTERONE CYPIONATE) 200 MG/ML injection [Pharmacy Med Name: TESTOSTERONE CYP 200MG /ML SDV 1ML] 2 mL     Sig: ADMINISTER 1 ML(200 MG) IN THE MUSCLE EVERY 14 DAYS     Off-Protocol Failed - 08/03/2021 10:17 AM      Failed - Medication not assigned to a protocol, review manually.      Passed - Valid encounter within last 12 months    Recent Outpatient Visits           2 weeks ago Osteoarthritis of spine with radiculopathy, lumbar region   [facility], Megan P, DO   1 month ago COVID-19   [facility], Lauren A, NP   4 months ago Benign prostatic hyperplasia with incomplete bladder emptying   [place], Megan P, DO   7 months ago Uncontrolled type 2 diabetes mellitus with hyperglycemia [facility])   [facility], Megan P, DO   11 months ago Uncontrolled type 2 diabetes mellitus with hyperglycemia [facility])   [place], Barb Merino, DO       Future Appointments             In 2 months Wynetta Emery, Barb Merino, DO [employer], [facility]

## 2021-08-09 ENCOUNTER — Other Ambulatory Visit: Payer: Self-pay

## 2021-08-10 ENCOUNTER — Other Ambulatory Visit: Payer: Self-pay | Admitting: Family Medicine

## 2021-08-11 NOTE — Telephone Encounter (Signed)
Requested medications are due for refill today yes  Requested medications are on the active medication list yes  Last refill 06/15/21  Last visit seen 11/2020, 03/2021, and 07/2021, labs in March 2022  Future visit scheduled no  Notes to clinic This med is not assigned to a protocol to assess. Requested Prescriptions  Pending Prescriptions Disp Refills   testosterone cypionate (DEPOTESTOSTERONE CYPIONATE) 200 MG/ML injection [Pharmacy Med Name: TESTOSTERONE CYP 200MG /ML SDV 1ML] 2 mL     Sig: ADMINISTER 1 ML(200 MG) IN THE MUSCLE EVERY 14 DAYS     Off-Protocol Failed - 08/10/2021  5:32 PM      Failed - Medication not assigned to a protocol, review manually.      Passed - Valid encounter within last 12 months    Recent Outpatient Visits           3 weeks ago Osteoarthritis of spine with radiculopathy, lumbar region   [facility], Megan P, DO   1 month ago COVID-19   [facility], Lauren A, NP   4 months ago Benign prostatic hyperplasia with incomplete bladder emptying   [place], Megan P, DO   7 months ago Uncontrolled type 2 diabetes mellitus with hyperglycemia [facility])   [facility], Megan P, DO   11 months ago Uncontrolled type 2 diabetes mellitus with hyperglycemia [facility])   [place], Barb Merino, DO       Future Appointments             In 2 months Wynetta Emery, Barb Merino, DO [employer], [facility]

## 2021-08-30 ENCOUNTER — Other Ambulatory Visit: Payer: Self-pay | Admitting: Family Medicine

## 2021-08-31 NOTE — Telephone Encounter (Signed)
Rx written 07/17/2021 #360 with 1 refill. Pt should have enough medication to last until 12/2021.

## 2021-09-02 NOTE — Telephone Encounter (Signed)
Called [facility] and spoke with Barista (pharmacist) who stated to ignore request for Metformin.  Requested Prescriptions  Pending Prescriptions Disp Refills   metFORMIN (GLUCOPHAGE-XR) 500 MG 24 hr tablet [Pharmacy Med Name: METFORMIN ER 500MG 24HR TABS] 360 tablet 1    Sig: TAKE 2 TABLETS(1000 MG) BY MOUTH TWICE DAILY     Endocrinology:  Diabetes - Biguanides Failed - 08/30/2021  8:18 AM      Failed - Cr in normal range and within 360 days    Creatinine, Ser  Date Value Ref Range Status  12/26/2020 1.29 (H) 0.76 - 1.27 mg/dL Final          Passed - HBA1C is between 0 and 7.9 and within 180 days    HB A1C (BAYER DCA - WAIVED)  Date Value Ref Range Status  03/22/2021 5.7 <7.0 % Final    Comment:                                          Diabetic Adult            <7.0                                       Healthy Adult        4.3 - 5.7                                                           (DCCT/NGSP) American Diabetes Association's Summary of Glycemic Recommendations for Adults with Diabetes: Hemoglobin A1c <7.0%. More stringent glycemic goals (A1c <6.0%) may further reduce complications at the cost of increased risk of hypoglycemia.           Passed - eGFR in normal range and within 360 days    GFR calc Af Amer  Date Value Ref Range Status  05/30/2020 96 >59 mL/min/1.73 Final    Comment:    **[facility] currently reports eGFR in compliance with the current**   recommendations of the [employer]. [facility] will   update reporting as new guidelines are published from the NKF-ASN   Task force.    GFR calc non Af Amer  Date Value Ref Range Status  05/30/2020 83 >59 mL/min/1.73 Final   eGFR  Date Value Ref Range Status  12/26/2020 62 >59 mL/min/1.73 Final          Passed - Valid encounter within last 6 months    Recent Outpatient Visits           1 month ago Osteoarthritis of spine with radiculopathy, lumbar region   [facility],  Megan P, DO   2 months ago COVID-19   [employer], Lauren A, NP   5 months ago Benign prostatic hyperplasia with incomplete bladder emptying   [place], Megan P, DO   8 months ago Uncontrolled type 2 diabetes mellitus with hyperglycemia ([place])   [facility], Megan P, DO   12 months ago Uncontrolled type 2 diabetes mellitus with hyperglycemia ([place])   [place], [place], DO  Future Appointments             In 1 month [place], Barb Merino, DO [facility]

## 2021-09-11 ENCOUNTER — Other Ambulatory Visit: Payer: Self-pay | Admitting: Family Medicine

## 2021-09-11 NOTE — Telephone Encounter (Signed)
Pharmacy has refill available.

## 2021-10-09 ENCOUNTER — Other Ambulatory Visit: Payer: Self-pay | Admitting: Family Medicine

## 2021-10-09 NOTE — Telephone Encounter (Signed)
Requested medication (s) are due for refill today: yes  Requested medication (s) are on the active medication list: yes  Last refill:  09/06/21  Future visit scheduled: 11/06/21  Notes to clinic:  this med does not have a protocol to follow, please assess.  Requested Prescriptions  Pending Prescriptions Disp Refills   testosterone cypionate (DEPOTESTOSTERONE CYPIONATE) 200 MG/ML injection [Pharmacy Med Name: TESTOSTERONE CYP 200MG /ML SDV 1ML] 2 mL     Sig: ADMINISTER 1 ML(200 MG) IN THE MUSCLE EVERY 14 DAYS     Off-Protocol Failed - 10/09/2021  1:26 PM      Failed - Medication not assigned to a protocol, review manually.      Passed - Valid encounter within last 12 months    Recent Outpatient Visits           2 months ago Osteoarthritis of spine with radiculopathy, lumbar region   [facility], Megan P, DO   3 months ago COVID-19   [facility], Lauren A, NP   6 months ago Benign prostatic hyperplasia with incomplete bladder emptying   [place], Megan P, DO   9 months ago Uncontrolled type 2 diabetes mellitus with hyperglycemia [facility])   [place], Megan P, DO   1 year ago Uncontrolled type 2 diabetes mellitus with hyperglycemia [facility])   [place], Barb Merino, DO       Future Appointments             In 4 weeks Wynetta Emery, Barb Merino, DO [employer] [facility]

## 2021-10-17 ENCOUNTER — Encounter: Payer: Managed Care, Other (non HMO) | Admitting: Family Medicine

## 2021-10-27 ENCOUNTER — Other Ambulatory Visit: Payer: Self-pay | Admitting: Family Medicine

## 2021-10-27 NOTE — Telephone Encounter (Signed)
Requested medication (s) are due for refill today: no  Requested medication (s) are on the active medication list: yes  Last refill:  10/10/21 #42mL/o  Future visit scheduled: yes  Notes to clinic:  Unable to refill per protocol, medication not assigned to the refill protocol.      Requested Prescriptions  Pending Prescriptions Disp Refills   testosterone cypionate (DEPOTESTOSTERONE CYPIONATE) 200 MG/ML injection [Pharmacy Med Name: TESTOSTERONE CYP 200MG /ML SDV 1ML] 2 mL     Sig: ADMINISTER 1 ML(200 MG) IN THE MUSCLE EVERY 14 DAYS     Off-Protocol Failed - 10/27/2021  4:26 PM      Failed - Medication not assigned to a protocol, review manually.      Passed - Valid encounter within last 12 months    Recent Outpatient Visits           3 months ago Osteoarthritis of spine with radiculopathy, lumbar region   [facility], Megan P, DO   3 months ago COVID-19   [employer], Lauren A, NP   7 months ago Benign prostatic hyperplasia with incomplete bladder emptying   [place], Megan P, DO   10 months ago Uncontrolled type 2 diabetes mellitus with hyperglycemia [facility])   [place], Megan P, DO   1 year ago Uncontrolled type 2 diabetes mellitus with hyperglycemia [facility])   [place], Barb Merino, DO       Future Appointments             In 1 week Wynetta Emery, Barb Merino, DO [employer] [facility]

## 2021-11-06 ENCOUNTER — Ambulatory Visit (INDEPENDENT_AMBULATORY_CARE_PROVIDER_SITE_OTHER): Payer: Managed Care, Other (non HMO) | Admitting: Family Medicine

## 2021-11-06 ENCOUNTER — Other Ambulatory Visit: Payer: Self-pay

## 2021-11-06 ENCOUNTER — Encounter: Payer: Self-pay | Admitting: Family Medicine

## 2021-11-06 VITALS — BP 126/78 | HR 57 | Temp 97.7°F | Ht 67.0 in | Wt 250.4 lb

## 2021-11-06 DIAGNOSIS — N401 Enlarged prostate with lower urinary tract symptoms: Secondary | ICD-10-CM

## 2021-11-06 DIAGNOSIS — Z0000 Encounter for general adult medical examination without abnormal findings: Secondary | ICD-10-CM | POA: Diagnosis not present

## 2021-11-06 DIAGNOSIS — I879 Disorder of vein, unspecified: Secondary | ICD-10-CM

## 2021-11-06 DIAGNOSIS — I129 Hypertensive chronic kidney disease with stage 1 through stage 4 chronic kidney disease, or unspecified chronic kidney disease: Secondary | ICD-10-CM

## 2021-11-06 DIAGNOSIS — E1129 Type 2 diabetes mellitus with other diabetic kidney complication: Secondary | ICD-10-CM | POA: Diagnosis not present

## 2021-11-06 DIAGNOSIS — Z6841 Body Mass Index (BMI) 40.0 and over, adult: Secondary | ICD-10-CM

## 2021-11-06 DIAGNOSIS — E291 Testicular hypofunction: Secondary | ICD-10-CM | POA: Diagnosis not present

## 2021-11-06 DIAGNOSIS — M170 Bilateral primary osteoarthritis of knee: Secondary | ICD-10-CM

## 2021-11-06 DIAGNOSIS — R809 Proteinuria, unspecified: Secondary | ICD-10-CM

## 2021-11-06 DIAGNOSIS — R3914 Feeling of incomplete bladder emptying: Secondary | ICD-10-CM

## 2021-11-06 LAB — HM DIABETES EYE EXAM

## 2021-11-06 LAB — URINALYSIS, ROUTINE W REFLEX MICROSCOPIC
Bilirubin, UA: NEGATIVE
Glucose, UA: NEGATIVE
Leukocytes,UA: NEGATIVE
Nitrite, UA: NEGATIVE
RBC, UA: NEGATIVE
Specific Gravity, UA: >1.03 — ABNORMAL HIGH (ref 1.005–1.030)
Urobilinogen, Ur: 0.2 mg/dL (ref 0.2–1.0)
pH, UA: 5.5 (ref 5.0–7.5)

## 2021-11-06 LAB — MICROALBUMIN, URINE WAIVED
Creatinine, Urine Waived: 200 mg/dL (ref 10–300)
Microalb, Ur Waived: 80 mg/L — ABNORMAL HIGH (ref 0–19)

## 2021-11-06 LAB — BAYER DCA HB A1C WAIVED: HB A1C (BAYER DCA - WAIVED): 5.9 % — ABNORMAL HIGH (ref 4.8–5.6)

## 2021-11-06 MED ORDER — DUTASTERIDE 0.5 MG PO CAPS: 0.5000 mg | ORAL_CAPSULE | Freq: Every day | ORAL | 3 refills | Status: DC

## 2021-11-06 MED ORDER — DICLOFENAC SODIUM 75 MG PO TBEC
75.0000 mg | DELAYED_RELEASE_TABLET | Freq: Two times a day (BID) | ORAL | 3 refills | Status: DC
Start: 2021-11-06 — End: 2022-05-09

## 2021-11-06 MED ORDER — OMEPRAZOLE 20 MG PO CPDR: 20.0000 mg | DELAYED_RELEASE_CAPSULE | Freq: Every day | ORAL | 3 refills | Status: DC

## 2021-11-06 MED ORDER — BUPROPION HCL ER (SR) 200 MG PO TB12: 200.0000 mg | ORAL_TABLET | Freq: Two times a day (BID) | ORAL | 1 refills | Status: DC

## 2021-11-06 MED ORDER — TESTOSTERONE CYPIONATE 200 MG/ML IM SOLN: 200.0000 mg | INTRAMUSCULAR | 5 refills | Status: DC

## 2021-11-06 MED ORDER — METFORMIN HCL ER 500 MG PO TB24: ORAL_TABLET | ORAL | 1 refills | Status: DC

## 2021-11-06 MED ORDER — BENAZEPRIL HCL 40 MG PO TABS
40.0000 mg | ORAL_TABLET | Freq: Every day | ORAL | 1 refills | Status: DC
Start: 2021-11-06 — End: 2022-05-09

## 2021-11-06 MED ORDER — METOPROLOL SUCCINATE ER 100 MG PO TB24: ORAL_TABLET | ORAL | 1 refills | Status: DC

## 2021-11-06 MED ORDER — GABAPENTIN 300 MG PO CAPS: ORAL_CAPSULE | ORAL | 1 refills | Status: DC

## 2021-11-06 MED ORDER — HYDROCHLOROTHIAZIDE 12.5 MG PO CAPS: 12.5000 mg | ORAL_CAPSULE | Freq: Every day | ORAL | 1 refills | Status: DC

## 2021-11-06 MED ORDER — TADALAFIL 5 MG PO TABS: 5.0000 mg | ORAL_TABLET | Freq: Every day | ORAL | 1 refills | Status: DC

## 2021-11-06 MED ORDER — TRAMADOL HCL 50 MG PO TABS: ORAL_TABLET | ORAL | 2 refills | Status: DC

## 2021-11-06 NOTE — Assessment & Plan Note (Signed)
Under good control on current regimen. Continue current regimen. Continue to monitor. Call with any concerns. Refills given for 3 months. Follow up 3 months.    

## 2021-11-06 NOTE — Assessment & Plan Note (Signed)
Under good control on current regimen. Continue current regimen. Continue to monitor. Call with any concerns. Refills given. Labs drawn today.   

## 2021-11-06 NOTE — Assessment & Plan Note (Signed)
Varicocities worsening. Offered referral to vascular. Would like to hold at this time. Will call if he changes his mind.

## 2021-11-06 NOTE — Assessment & Plan Note (Signed)
Doing well with A1c of 5.9. Continue current regimen. Continue to monitor. Call with any concerns.

## 2021-11-06 NOTE — Progress Notes (Signed)
BP 126/78    Pulse (!) 57    Temp 97.7 F (36.5 C)    Ht 5\' 7"  (1.702 m)    Wt 250 lb 6.4 oz (113.6 kg)    SpO2 97%    BMI 39.22 kg/m    Subjective:    Patient ID: Andrew [name], male    DOB: [birth year], 66 y.o.   MRN: [id number]  HPI: Andrew [name] is a 66 y.o. male presenting on 11/06/2021 for comprehensive medical examination. Current medical complaints include:  DIABETES Hypoglycemic episodes:no Polydipsia/polyuria: no Visual disturbance: no Chest pain: no Paresthesias: no Glucose Monitoring: yes  Accucheck frequency: Daily Taking Insulin?: no Blood Pressure Monitoring: not checking Retinal Examination: Up to Date Foot Exam: Up to Date Diabetic Education: Completed Pneumovax: Up to Date Influenza: Up to Date Aspirin: no  LOW TESTOSTERONE Duration: chronic Status: controlled  Satisfied with current treatment:  yes Previous testosterone therapies: injectable Medication side effects:  no Medication compliance: excellent compliance Decreased libido: no Fatigue: no Depressed mood: no Muscle weakness: no Erectile dysfunction: no  HYPERTENSION / HYPERLIPIDEMIA Satisfied with current treatment? yes Duration of hypertension: chronic BP monitoring frequency: not checking BP medication side effects: no Past BP meds: HCTZ, metoprolol, benazepril Duration of hyperlipidemia: chronic Cholesterol medication side effects: no Cholesterol supplements: none Past cholesterol medications: none Medication compliance: excellent compliance Aspirin: no Recent stressors: yes Recurrent headaches: no Visual changes: no Palpitations: no Dyspnea: no Chest pain: no Lower extremity edema: no Dizzy/lightheaded: no  ANXIETY/STRESS- wife has to have a mastectomy. He has been under a lot of stress Duration weeks Status:uncontrolled Anxious mood: yes  Excessive worrying: yes Irritability: yes  Sweating: no Nausea: no Palpitations:no Hyperventilation: no Panic attacks:  no Agoraphobia: no  Obscessions/compulsions: no Depressed mood: no Depression screen [facility] 2/9 11/06/2021 07/17/2021 06/30/2021 05/30/2020 05/01/2019  Decreased Interest 0 2 0 1 1  Down, Depressed, Hopeless 0 0 0 0 1  PHQ - 2 Score 0 2 0 1 2  Altered sleeping 0 0 0 1 0  Tired, decreased energy 0 2 0 0 0  Change in appetite 0 1 0 0 0  Feeling bad or failure about yourself  0 0 0 0 0  Trouble concentrating 0 0 0 0 0  Moving slowly or fidgety/restless 0 0 0 0 0  Suicidal thoughts 0 0 0 0 0  PHQ-9 Score 0 5 0 2 2  Difficult doing work/chores - - - Not difficult at all Not difficult at all   Anhedonia: no Weight changes: no Insomnia: no   Hypersomnia: no Fatigue/loss of energy: yes Feelings of worthlessness: no Feelings of guilt: no Impaired concentration/indecisiveness: no Suicidal ideations: no  Crying spells: no Recent Stressors/Life Changes: no   Relationship problems: no   Family stress: no     Financial stress: no    Job stress: no    Recent death/loss: no  BPH BPH status: uncontrolled Satisfied with current treatment?: no Medication side effects: no Medication compliance: excellent compliance Duration: chronic Nocturia: 1-2x per night Urinary frequency:yes Incomplete voiding: yes Urgency: yes Weak urinary stream: yes Straining to start stream: yes Dysuria: no Onset: gradual Severity: moderate  CHRONIC PAIN  Present dose: 15 Morphine equivalents Pain control status: stable Duration: chronic Location: knees and backs Quality: aching and sore Current Pain Level: moderate Previous Pain Level: moderate Breakthrough pain: no Benefit from narcotic medications: yes What Activities task can be accomplished with current medication? Able to do his ADLs Interested in weaning off  narcotics:no   Stool softners/OTC fiber: no  Previous pain specialty evaluation: no Non-narcotic analgesic meds: yes Narcotic contract: yes  He currently lives with: wife Interim Problems  from his last visit: no  Depression Screen done today and results listed below:  Depression screen [facility] 2/9 11/06/2021 07/17/2021 06/30/2021 05/30/2020 05/01/2019  Decreased Interest 0 2 0 1 1  Down, Depressed, Hopeless 0 0 0 0 1  PHQ - 2 Score 0 2 0 1 2  Altered sleeping 0 0 0 1 0  Tired, decreased energy 0 2 0 0 0  Change in appetite 0 1 0 0 0  Feeling bad or failure about yourself  0 0 0 0 0  Trouble concentrating 0 0 0 0 0  Moving slowly or fidgety/restless 0 0 0 0 0  Suicidal thoughts 0 0 0 0 0  PHQ-9 Score 0 5 0 2 2  Difficult doing work/chores - - - Not difficult at all Not difficult at all    Past Medical History:  Past Medical History:  Diagnosis Date   Chronic pain syndrome    Depression    Diverticulosis    DVT (deep venous thrombosis) (HCC)    bilateral, was on anticoags for 69months   Ear infection 08/2016   CLEARING UP-FINISHED ANTIBIOTIC AND PREDNISONE TAPER    Family history of adverse reaction to anesthesia    BROTHER-N/V   Hernia, umbilical    History of kidney stones    Hyperlipidemia    Hypertension    IFG (impaired fasting glucose)    Knee pain, chronic    Pes anserine bursitis     Surgical History:  Past Surgical History:  Procedure Laterality Date   COLONOSCOPY  2015   JOINT REPLACEMENT Bilateral 2009   replacement and revision   KIDNEY STONE SURGERY     SHOULDER ARTHROSCOPY WITH ROTATOR CUFF REPAIR Right    UMBILICAL HERNIA REPAIR N/A 09/06/2016   Procedure: HERNIA REPAIR UMBILICAL ADULT;  Surgeon: Robert Bellow, MD;  Location: [facility] ORS;  Service: General;  Laterality: N/A;    Medications:  Current Outpatient Medications on File Prior to Visit  Medication Sig   clindamycin (CLEOCIN T) 1 % external solution Apply to affected areas groin daily after shower   No current facility-administered medications on file prior to visit.    Allergies:  Allergies  Allergen Reactions   Other Other (See Comments)    Adhesive tape gave blisters    Flomax [Tamsulosin] Other (See Comments)    Sexual dysfunction    Social History:  Social History   Socioeconomic History   Marital status: Married    Spouse name: Not on file   Number of children: Not on file   Years of education: Not on file   Highest education level: Not on file  Occupational History   Not on file  Tobacco Use   Smoking status: Former    Packs/day: 1.50    Years: 25.00    Pack years: 37.50    Types: Cigarettes    Quit date: 06/28/2011    Years since quitting: 10.3   Smokeless tobacco: Never  Vaping Use   Vaping Use: Never used  Substance and Sexual Activity   Alcohol use: Yes    Comment: on occasion   Drug use: No   Sexual activity: Yes    Birth control/protection: None  Other Topics Concern   Not on file  Social History Narrative   Not on file   Social Determinants of  Health   Financial Resource Strain: Not on file  Food Insecurity: Not on file  Transportation Needs: Not on file  Physical Activity: Not on file  Stress: Not on file  Social Connections: Not on file  Intimate Partner Violence: Not on file   Social History   Tobacco Use  Smoking Status Former   Packs/day: 1.50   Years: 25.00   Pack years: 37.50   Types: Cigarettes   Quit date: 06/28/2011   Years since quitting: 10.3  Smokeless Tobacco Never   Social History   Substance and Sexual Activity  Alcohol Use Yes   Comment: on occasion    Family History:  Family History  Problem Relation Age of Onset   Alcohol abuse Father    Hypertension Father    Cirrhosis Father    Hypertension Brother    Stroke Paternal Grandmother    Stroke Paternal Grandfather    Pneumonia Mother     Past medical history, surgical history, medications, allergies, family history and social history reviewed with patient today and changes made to appropriate areas of the chart.   Review of Systems  Constitutional: Negative.   HENT: Negative.    Eyes: Negative.   Respiratory: Negative.     Cardiovascular: Negative.   Gastrointestinal:  Positive for heartburn. Negative for abdominal pain, blood in stool, constipation, diarrhea, melena, nausea and vomiting.  Genitourinary: Negative.   Musculoskeletal: Negative.   Skin: Negative.   Neurological: Negative.   Endo/Heme/Allergies: Negative.   Psychiatric/Behavioral:  Negative for depression, hallucinations, memory loss, substance abuse and suicidal ideas. The patient is nervous/anxious. The patient does not have insomnia.   All other ROS negative except what is listed above and in the HPI.      Objective:    BP 126/78    Pulse (!) 57    Temp 97.7 F (36.5 C)    Ht 5\' 7"  (1.702 m)    Wt 250 lb 6.4 oz (113.6 kg)    SpO2 97%    BMI 39.22 kg/m   Wt Readings from Last 3 Encounters:  11/06/21 250 lb 6.4 oz (113.6 kg)  07/17/21 242 lb (109.8 kg)  03/22/21 255 lb 9.6 oz (115.9 kg)    Physical Exam Vitals and nursing note reviewed.  Constitutional:      General: He is not in acute distress.    Appearance: Normal appearance. He is obese. He is not ill-appearing, toxic-appearing or diaphoretic.  HENT:     Head: Normocephalic and atraumatic.     Right Ear: Tympanic membrane, ear canal and external ear normal. There is no impacted cerumen.     Left Ear: Tympanic membrane, ear canal and external ear normal. There is no impacted cerumen.     Nose: Nose normal. No congestion or rhinorrhea.     Mouth/Throat:     Mouth: Mucous membranes are moist.     Pharynx: Oropharynx is clear. No oropharyngeal exudate or posterior oropharyngeal erythema.  Eyes:     General: No scleral icterus.       Right eye: No discharge.        Left eye: No discharge.     Extraocular Movements: Extraocular movements intact.     Conjunctiva/sclera: Conjunctivae normal.     Pupils: Pupils are equal, round, and reactive to light.  Neck:     Vascular: No carotid bruit.  Cardiovascular:     Rate and Rhythm: Normal rate and regular rhythm.     Pulses: Normal  pulses.  Heart sounds: No murmur heard.   No friction rub. No gallop.  Pulmonary:     Effort: Pulmonary effort is normal. No respiratory distress.     Breath sounds: Normal breath sounds. No stridor. No wheezing, rhonchi or rales.  Chest:     Chest wall: No tenderness.  Abdominal:     General: Abdomen is flat. Bowel sounds are normal. There is no distension.     Palpations: Abdomen is soft. There is no mass.     Tenderness: There is no abdominal tenderness. There is no right CVA tenderness, left CVA tenderness, guarding or rebound.     Hernia: No hernia is present.  Genitourinary:    Comments: Genital exam deferred with shared decision making Musculoskeletal:        General: No swelling, tenderness, deformity or signs of injury.     Cervical back: Normal range of motion and neck supple. No rigidity. No muscular tenderness.     Right lower leg: No edema.     Left lower leg: No edema.  Lymphadenopathy:     Cervical: No cervical adenopathy.  Skin:    General: Skin is warm and dry.     Capillary Refill: Capillary refill takes less than 2 seconds.     Coloration: Skin is not jaundiced or pale.     Findings: No bruising, erythema, lesion or rash.  Neurological:     General: No focal deficit present.     Mental Status: He is alert and oriented to person, place, and time.     Cranial Nerves: No cranial nerve deficit.     Sensory: No sensory deficit.     Motor: No weakness.     Coordination: Coordination normal.     Gait: Gait normal.     Deep Tendon Reflexes: Reflexes normal.  Psychiatric:        Mood and Affect: Mood normal.        Behavior: Behavior normal.        Thought Content: Thought content normal.        Judgment: Judgment normal.    Results for orders placed or performed in visit on 11/06/21  Urinalysis, Routine w reflex microscopic  Result Value Ref Range   Specific Gravity, UA >1.030 (H) 1.005 - 1.030   pH, UA 5.5 5.0 - 7.5   Color, UA Yellow Yellow    Appearance Ur Clear Clear   Leukocytes,UA Negative Negative   Protein,UA Trace (A) Negative/Trace   Glucose, UA Negative Negative   Ketones, UA Trace (A) Negative   RBC, UA Negative Negative   Bilirubin, UA Negative Negative   Urobilinogen, Ur 0.2 0.2 - 1.0 mg/dL   Nitrite, UA Negative Negative  Microalbumin, Urine Waived  Result Value Ref Range   Microalb, Ur Waived 80 (H) 0 - 19 mg/L   Creatinine, Urine Waived 200 10 - 300 mg/dL   Microalb/Creat Ratio 30-300 (H) <30 mg/g  Bayer DCA Hb A1c Waived  Result Value Ref Range   HB A1C (BAYER DCA - WAIVED) 5.9 (H) 4.8 - 5.6 %      Assessment & Plan:   Problem List Items Addressed This Visit       Cardiovascular and Mediastinum   Disorder of vein    Varicocities worsening. Offered referral to vascular. Would like to hold at this time. Will call if he changes his mind.      Relevant Medications   tadalafil (CIALIS) 5 MG tablet   metoprolol succinate (TOPROL-XL) 100 MG 24  hr tablet   hydrochlorothiazide (MICROZIDE) 12.5 MG capsule   benazepril (LOTENSIN) 40 MG tablet     Endocrine   Hypogonadism in male    Under good control on current regimen. Continue current regimen. Continue to monitor. Call with any concerns. Refills given. Labs drawn today.       Relevant Orders   CBC with Differential/Platelet   Testosterone, free, total([facility])   Controlled type 2 diabetes mellitus with microalbuminuria ([place])    Doing well with A1c of 5.9. Continue current regimen. Continue to monitor. Call with any concerns.       Relevant Medications   metFORMIN (GLUCOPHAGE-XR) 500 MG 24 hr tablet   benazepril (LOTENSIN) 40 MG tablet   Other Relevant Orders   CBC with Differential/Platelet   Microalbumin, Urine Waived (Completed)   Bayer DCA Hb A1c Waived (Completed)     Musculoskeletal and Integument   Primary osteoarthritis of both knees    Under good control on current regimen. Continue current regimen. Continue to monitor. Call  with any concerns. Refills given for 3 months. Follow up 3 months.        Relevant Medications   traMADol (ULTRAM) 50 MG tablet   diclofenac (VOLTAREN) 75 MG EC tablet     Genitourinary   Benign hypertensive renal disease    Under good control on current regimen. Continue current regimen. Continue to monitor. Call with any concerns. Refills given. Labs drawn today.       Relevant Orders   CBC with Differential/Platelet   Microalbumin, Urine Waived (Completed)   Benign prostatic hyperplasia with incomplete bladder emptying    Not doing well. Will continue cialis and add in avodart. Recheck 3 months. Call with any concerns.       Relevant Medications   dutasteride (AVODART) 0.5 MG capsule     Other   Class 3 severe obesity due to excess calories with serious comorbidity and body mass index (BMI) of 40.0 to 44.9 in adult [facility])    Encouraged diet and exercise with goal of losing 1-2lbs per week. Call with any concerns.       Relevant Medications   metFORMIN (GLUCOPHAGE-XR) 500 MG 24 hr tablet   Other Visit Diagnoses     Routine general medical examination at a health care facility    -  Primary   Vaccines up to date. Screening labs checked today. Colonoscopy up to date. Continue diet and exercise. Call with any concerns.    Relevant Orders   Comprehensive metabolic panel   CBC with Differential/Platelet   Lipid Panel w/o Chol/HDL Ratio   PSA   TSH   Urinalysis, Routine w reflex microscopic (Completed)   Microalbumin, Urine Waived (Completed)   Bayer DCA Hb A1c Waived (Completed)   Testosterone, free, total([facility])       LABORATORY TESTING:  Health maintenance labs ordered today as discussed above.   The natural history of prostate cancer and ongoing controversy regarding screening and potential treatment outcomes of prostate cancer has been discussed with the patient. The meaning of a false positive PSA and a false negative PSA has been discussed. He  indicates understanding of the limitations of this screening test and wishes to proceed with screening PSA testing.   IMMUNIZATIONS:   - Tdap: Tetanus vaccination status reviewed: last tetanus booster within 10 years. - Influenza: Up to date - Pneumovax: Up to date - Prevnar: Up to date - COVID: Up to date - HPV: Not applicable - Shingrix vaccine: will consider  SCREENING: - Colonoscopy: Up to date  Discussed with patient purpose of the colonoscopy is to detect colon cancer at curable precancerous or early stages   PATIENT COUNSELING:    Sexuality: Discussed sexually transmitted diseases, partner selection, use of condoms, avoidance of unintended pregnancy  and contraceptive alternatives.   Advised to avoid cigarette smoking.  I discussed with the patient that most people either abstain from alcohol or drink within safe limits (<=14/week and <=4 drinks/occasion for males, <=7/weeks and <= 3 drinks/occasion for females) and that the risk for alcohol disorders and other health effects rises proportionally with the number of drinks per week and how often a drinker exceeds daily limits.  Discussed cessation/primary prevention of drug use and availability of treatment for abuse.   Diet: Encouraged to adjust caloric intake to maintain  or achieve ideal body weight, to reduce intake of dietary saturated fat and total fat, to limit sodium intake by avoiding high sodium foods and not adding table salt, and to maintain adequate dietary potassium and calcium preferably from fresh fruits, vegetables, and low-fat dairy products.    stressed the importance of regular exercise  Injury prevention: Discussed safety belts, safety helmets, smoke detector, smoking near bedding or upholstery.   Dental health: Discussed importance of regular tooth brushing, flossing, and dental visits.   Follow up plan: NEXT PREVENTATIVE PHYSICAL DUE IN 1 YEAR. Return in about 3 months (around 02/03/2022).

## 2021-11-06 NOTE — Assessment & Plan Note (Signed)
Not doing well. Will continue cialis and add in avodart. Recheck 3 months. Call with any concerns.

## 2021-11-06 NOTE — Assessment & Plan Note (Signed)
Encouraged diet and exercise with goal of losing 1-2lbs per week. Call with any concerns.  

## 2021-11-08 LAB — PSA: Prostate Specific Ag, Serum: 1.1 ng/mL (ref 0.0–4.0)

## 2021-11-08 LAB — LIPID PANEL W/O CHOL/HDL RATIO
Cholesterol, Total: 191 mg/dL (ref 100–199)
HDL: 28 mg/dL — ABNORMAL LOW (ref >39)
LDL Chol Calc (NIH): 101 mg/dL — ABNORMAL HIGH (ref 0–99)
Triglycerides: 366 mg/dL — ABNORMAL HIGH (ref 0–149)
VLDL Cholesterol Cal: 62 mg/dL — ABNORMAL HIGH (ref 5–40)

## 2021-11-08 LAB — CBC WITH DIFFERENTIAL/PLATELET
Basophils Absolute: 0.1 10*3/uL (ref 0.0–0.2)
Basos: 1 %
EOS (ABSOLUTE): 0.2 10*3/uL (ref 0.0–0.4)
Eos: 3 %
Hematocrit: 51.9 % — ABNORMAL HIGH (ref 37.5–51.0)
Hemoglobin: 18.3 g/dL — ABNORMAL HIGH (ref 13.0–17.7)
Immature Grans (Abs): 0.1 10*3/uL (ref 0.0–0.1)
Immature Granulocytes: 1 %
Lymphocytes Absolute: 1.5 10*3/uL (ref 0.7–3.1)
Lymphs: 19 %
MCH: 32.6 pg (ref 26.6–33.0)
MCHC: 35.3 g/dL (ref 31.5–35.7)
MCV: 92 fL (ref 79–97)
Monocytes Absolute: 0.7 10*3/uL (ref 0.1–0.9)
Monocytes: 9 %
Neutrophils Absolute: 5.3 10*3/uL (ref 1.4–7.0)
Neutrophils: 67 %
Platelets: 164 10*3/uL (ref 150–450)
RBC: 5.62 x10E6/uL (ref 4.14–5.80)
RDW: 13.3 % (ref 11.6–15.4)
WBC: 7.8 10*3/uL (ref 3.4–10.8)

## 2021-11-08 LAB — TESTOSTERONE, FREE, TOTAL, SHBG
Sex Hormone Binding: 29.5 nmol/L (ref 19.3–76.4)
Testosterone, Free: 25.1 pg/mL — ABNORMAL HIGH (ref 6.6–18.1)
Testosterone: 994 ng/dL — ABNORMAL HIGH (ref 264–916)

## 2021-11-08 LAB — COMPREHENSIVE METABOLIC PANEL
ALT: 33 IU/L (ref 0–44)
AST: 23 IU/L (ref 0–40)
Albumin/Globulin Ratio: 2.3 — ABNORMAL HIGH (ref 1.2–2.2)
Albumin: 4.3 g/dL (ref 3.8–4.8)
Alkaline Phosphatase: 105 IU/L (ref 44–121)
BUN/Creatinine Ratio: 17 (ref 10–24)
BUN: 24 mg/dL (ref 8–27)
Bilirubin Total: 0.7 mg/dL (ref 0.0–1.2)
CO2: 19 mmol/L — ABNORMAL LOW (ref 20–29)
Calcium: 9.5 mg/dL (ref 8.6–10.2)
Chloride: 103 mmol/L (ref 96–106)
Creatinine, Ser: 1.43 mg/dL — ABNORMAL HIGH (ref 0.76–1.27)
Globulin, Total: 1.9 g/dL (ref 1.5–4.5)
Glucose: 135 mg/dL — ABNORMAL HIGH (ref 70–99)
Potassium: 5 mmol/L (ref 3.5–5.2)
Sodium: 141 mmol/L (ref 134–144)
Total Protein: 6.2 g/dL (ref 6.0–8.5)
eGFR: 55 mL/min/{1.73_m2} — ABNORMAL LOW (ref >59)

## 2021-11-08 LAB — TSH: TSH: 3.73 u[IU]/mL (ref 0.450–4.500)

## 2022-02-02 ENCOUNTER — Other Ambulatory Visit: Payer: Self-pay | Admitting: Family Medicine

## 2022-02-02 NOTE — Telephone Encounter (Signed)
Dose was increased on 11/06/21 ?Requested Prescriptions  ?Pending Prescriptions Disp Refills  ?? buPROPion (WELLBUTRIN SR) 150 MG 12 hr tablet [Pharmacy Med Name: BUPROPION SR '150MG'$  TABLETS (12 H)] 180 tablet 1  ?  Sig: TAKE 1 TABLET(150 MG) BY MOUTH TWICE DAILY  ?  ? Psychiatry: Antidepressants - bupropion Failed - 02/02/2022  6:22 AM  ?  ?  Failed - Cr in normal range and within 360 days  ?  Creatinine, Ser  ?Date Value Ref Range Status  ?11/06/2021 1.43 (H) 0.76 - 1.27 mg/dL Final  ?   ?  ?  Passed - AST in normal range and within 360 days  ?  AST  ?Date Value Ref Range Status  ?11/06/2021 23 0 - 40 IU/L Final  ?   ?  ?  Passed - ALT in normal range and within 360 days  ?  ALT  ?Date Value Ref Range Status  ?11/06/2021 33 0 - 44 IU/L Final  ?   ?  ?  Passed - Last BP in normal range  ?  BP Readings from Last 1 Encounters:  ?11/06/21 126/78  ?   ?  ?  Passed - Valid encounter within last 6 months  ?  Recent Outpatient Visits   ?      ? 2 months ago Routine general medical examination at a health care facility  ? [place] P, DO  ? 6 months ago Osteoarthritis of spine with radiculopathy, lumbar region  ? [place], Megan P, DO  ? 7 months ago COVID-19  ? [place], Lauren A, NP  ? 10 months ago Benign prostatic hyperplasia with incomplete bladder emptying  ? [place], [place] P, DO  ? 1 year ago Uncontrolled type 2 diabetes mellitus with hyperglycemia ([place])  ? [place], [place] P, DO  ?  ?  ?Future Appointments   ?        ? In 3 days Valerie Roys, DO [facility], [facility]  ?  ? ?  ?  ?  ? ? ?

## 2022-02-05 ENCOUNTER — Ambulatory Visit: Payer: Managed Care, Other (non HMO) | Admitting: Family Medicine

## 2022-02-06 ENCOUNTER — Ambulatory Visit: Payer: Managed Care, Other (non HMO) | Admitting: Family Medicine

## 2022-02-06 ENCOUNTER — Encounter: Payer: Self-pay | Admitting: Family Medicine

## 2022-02-06 VITALS — BP 149/85 | HR 57 | Temp 98.1°F | Wt 255.0 lb

## 2022-02-06 DIAGNOSIS — G8929 Other chronic pain: Secondary | ICD-10-CM

## 2022-02-06 DIAGNOSIS — R809 Proteinuria, unspecified: Secondary | ICD-10-CM | POA: Diagnosis not present

## 2022-02-06 DIAGNOSIS — M545 Low back pain, unspecified: Secondary | ICD-10-CM

## 2022-02-06 DIAGNOSIS — E1129 Type 2 diabetes mellitus with other diabetic kidney complication: Secondary | ICD-10-CM | POA: Diagnosis not present

## 2022-02-06 LAB — BAYER DCA HB A1C WAIVED: HB A1C (BAYER DCA - WAIVED): 6.1 % — ABNORMAL HIGH (ref 4.8–5.6)

## 2022-02-06 MED ORDER — TRAMADOL HCL 50 MG PO TABS: ORAL_TABLET | ORAL | 2 refills | Status: DC

## 2022-02-06 NOTE — Assessment & Plan Note (Signed)
Under good control on current regimen. Continue current regimen. Continue to monitor. Call with any concerns. Refills given for 3 months. Follow up 3 months.    

## 2022-02-06 NOTE — Assessment & Plan Note (Signed)
Doing well with A1c of 6.1. Continue diet and exercise and metformin. Continue to monitor. Call with any concerns.  ?

## 2022-02-06 NOTE — Progress Notes (Signed)
? ?BP (!) 149/85   Pulse (!) 57   Temp 98.1 ?F (36.7 ?C)   Wt 255 lb (115.7 kg)   SpO2 96%   BMI 39.94 kg/m?   ? ?Subjective:  ? ? Patient ID: Andrew [name], male    DOB: May 01, 1956, 66 y.o.   MRN: [id number] ? ?HPI: ?[name] is a 66 y.o. male ? ?Chief Complaint  ?Patient presents with  ? Diabetes  ? Pain  ? ?DIABETES ?Hypoglycemic episodes:no ?Polydipsia/polyuria: no ?Visual disturbance: no ?Chest pain: no ?Paresthesias: no ?Glucose Monitoring: no ?Taking Insulin?: no ?Blood Pressure Monitoring: not checking ?Retinal Examination: Up to Date ?Foot Exam: Up to Date ?Diabetic Education: Completed ?Pneumovax: Up to Date ?Influenza: Up to Date ?Aspirin: yes ? ?Did not start his avodart.  ? ?CHRONIC PAIN  ?Present dose: 15 Morphine equivalents ?Pain control status: controlled ?Duration: chronic ?Location: back and shoulders ?Quality: aching and sore ?Current Pain Level: mild ?Previous Pain Level: moderate ?Breakthrough pain: no ?Benefit from narcotic medications: yes ?What Activities task can be accomplished with current medication? Able to do his ADLs ?Interested in weaning off narcotics:no   ?Stool softners/OTC fiber: no  ?Previous pain specialty evaluation: yes ?Non-narcotic analgesic meds: yes ?Narcotic contract: yes ? ? ?Relevant past medical, surgical, family and social history reviewed and updated as indicated. Interim medical history since our last visit reviewed. ?Allergies and medications reviewed and updated. ? ?Review of Systems  ?Constitutional: Negative.   ?Respiratory: Negative.    ?Cardiovascular: Negative.   ?Gastrointestinal: Negative.   ?Musculoskeletal: Negative.   ?Neurological: Negative.   ?Psychiatric/Behavioral: Negative.    ? ?Per HPI unless specifically indicated above ? ?   ?Objective:  ?  ?BP (!) 149/85   Pulse (!) 57   Temp 98.1 ?F (36.7 ?C)   Wt 255 lb (115.7 kg)   SpO2 96%   BMI 39.94 kg/m?   ?Wt Readings from Last 3 Encounters:  ?02/06/22 255 lb (115.7 kg)  ?11/06/21 250  lb 6.4 oz (113.6 kg)  ?07/17/21 242 lb (109.8 kg)  ?  ?Physical Exam ?Vitals and nursing note reviewed.  ?Constitutional:   ?   General: He is not in acute distress. ?   Appearance: Normal appearance. He is not ill-appearing, toxic-appearing or diaphoretic.  ?HENT:  ?   Head: Normocephalic and atraumatic.  ?   Right Ear: External ear normal.  ?   Left Ear: External ear normal.  ?   Nose: Nose normal.  ?   Mouth/Throat:  ?   Mouth: Mucous membranes are moist.  ?   Pharynx: Oropharynx is clear.  ?Eyes:  ?   General: No scleral icterus.    ?   Right eye: No discharge.     ?   Left eye: No discharge.  ?   Extraocular Movements: Extraocular movements intact.  ?   Conjunctiva/sclera: Conjunctivae normal.  ?   Pupils: Pupils are equal, round, and reactive to light.  ?Cardiovascular:  ?   Rate and Rhythm: Normal rate and regular rhythm.  ?   Pulses: Normal pulses.  ?   Heart sounds: Normal heart sounds. No murmur heard. ?  No friction rub. No gallop.  ?Pulmonary:  ?   Effort: Pulmonary effort is normal. No respiratory distress.  ?   Breath sounds: Normal breath sounds. No stridor. No wheezing, rhonchi or rales.  ?Chest:  ?   Chest wall: No tenderness.  ?Musculoskeletal:     ?   General: Normal range of motion.  ?  Cervical back: Normal range of motion and neck supple.  ?Skin: ?   General: Skin is warm and dry.  ?   Capillary Refill: Capillary refill takes less than 2 seconds.  ?   Coloration: Skin is not jaundiced or pale.  ?   Findings: No bruising, erythema, lesion or rash.  ?Neurological:  ?   General: No focal deficit present.  ?   Mental Status: He is alert and oriented to person, place, and time. Mental status is at baseline.  ?Psychiatric:     ?   Mood and Affect: Mood normal.     ?   Behavior: Behavior normal.     ?   Thought Content: Thought content normal.     ?   Judgment: Judgment normal.  ? ? ?Results for orders placed or performed in visit on 11/08/21  ?HM DIABETES EYE EXAM  ?Result Value Ref Range  ? HM  Diabetic Eye Exam No Retinopathy No Retinopathy  ? ?   ?Assessment & Plan:  ? ?Problem List Items Addressed This Visit   ? ?  ? Endocrine  ? Controlled type 2 diabetes mellitus with microalbuminuria ([place]) - Primary  ?  Doing well with A1c of 6.1. Continue diet and exercise and metformin. Continue to monitor. Call with any concerns.  ? ?  ?  ? Relevant Orders  ? Bayer DCA Hb A1c Waived  ?  ? Other  ? Chronic low back pain  ?  Under good control on current regimen. Continue current regimen. Continue to monitor. Call with any concerns. Refills given for 3 months. Follow up 3 months.  ? ? ?  ?  ? Relevant Medications  ? traMADol (ULTRAM) 50 MG tablet  ?  ? ?Follow up plan: ?Return in about 3 months (around 05/09/2022). ? ? ? ? ? ?

## 2022-03-09 ENCOUNTER — Other Ambulatory Visit: Payer: Self-pay | Admitting: Family Medicine

## 2022-03-09 ENCOUNTER — Other Ambulatory Visit: Payer: Self-pay | Admitting: Dermatology

## 2022-03-09 DIAGNOSIS — L732 Hidradenitis suppurativa: Secondary | ICD-10-CM

## 2022-03-09 NOTE — Telephone Encounter (Signed)
Refilled 11/06/2021 #360 1 refill - 6 month supply. Requested Prescriptions  Pending Prescriptions Disp Refills  . gabapentin (NEURONTIN) 300 MG capsule [Pharmacy Med Name: GABAPENTIN '300MG'$  CAPSULES] 360 capsule 1    Sig: TAKE 2 CAPSULES(600 MG) BY MOUTH TWICE DAILY     Neurology: Anticonvulsants - gabapentin Failed - 03/09/2022  3:33 AM      Failed - Cr in normal range and within 360 days    Creatinine, Ser  Date Value Ref Range Status  11/06/2021 1.43 (H) 0.76 - 1.27 mg/dL Final         Passed - Completed PHQ-2 or PHQ-9 in the last 360 days      Passed - Valid encounter within last 12 months    Recent Outpatient Visits          1 month ago Controlled type 2 diabetes mellitus with microalbuminuria, without long-term current use of insulin ([place])   [place], Megan P, DO   4 months ago Routine general medical examination at a health care facility   [facility], [place] P, DO   7 months ago Osteoarthritis of spine with radiculopathy, lumbar region   [facility], Megan P, DO   8 months ago COVID-19   [facility], Lauren A, NP   11 months ago Benign prostatic hyperplasia with incomplete bladder emptying   [facility] Valerie Roys, DO      Future Appointments            In 2 months Wynetta Emery, Barb Merino, DO [facility], [facility]

## 2022-03-13 ENCOUNTER — Other Ambulatory Visit: Payer: Self-pay | Admitting: Family Medicine

## 2022-03-13 NOTE — Telephone Encounter (Signed)
Requested Prescriptions  Pending Prescriptions Disp Refills  . dutasteride (AVODART) 0.5 MG capsule [Pharmacy Med Name: DUTASTERIDE 0.'5MG'$  CAPSULES] 90 capsule 0    Sig: TAKE 1 CAPSULE(0.5 MG) BY MOUTH DAILY     Urology: 5-alpha Reductase Inhibitors Passed - 03/13/2022  9:41 AM      Passed - PSA in normal range and within 360 days    Prostate Specific Ag, Serum  Date Value Ref Range Status  11/06/2021 1.1 0.0 - 4.0 ng/mL Final    Comment:    Roche ECLIA methodology. According to the American Urological Association, Serum PSA should decrease and remain at undetectable levels after radical prostatectomy. The AUA defines biochemical recurrence as an initial PSA value 0.2 ng/mL or greater followed by a subsequent confirmatory PSA value 0.2 ng/mL or greater. Values obtained with different assay methods or kits cannot be used interchangeably. Results cannot be interpreted as absolute evidence of the presence or absence of malignant disease.          Passed - Valid encounter within last 12 months    Recent Outpatient Visits          1 month ago Controlled type 2 diabetes mellitus with microalbuminuria, without long-term current use of insulin ([place])   [place], Megan P, DO   4 months ago Routine general medical examination at a health care facility   [facility], [place] P, DO   7 months ago Osteoarthritis of spine with radiculopathy, lumbar region   [facility], Megan P, DO   8 months ago COVID-19   [facility], Lauren A, NP   11 months ago Benign prostatic hyperplasia with incomplete bladder emptying   [facility] Valerie Roys, DO      Future Appointments            In 1 month [place], Barb Merino, DO [facility], [facility]

## 2022-03-31 ENCOUNTER — Other Ambulatory Visit: Payer: Self-pay | Admitting: Family Medicine

## 2022-04-02 NOTE — Telephone Encounter (Signed)
Requested medication (s) are due for refill today: no  Requested medication (s) are on the active medication list: yes  Last refill:  11/06/21 #90 1 refills  Future visit scheduled: yes in 1 month   Notes to clinic:  02/06/22 BP 149/85. Do you want to refill Rx?     Requested Prescriptions  Pending Prescriptions Disp Refills   tadalafil (CIALIS) 5 MG tablet [Pharmacy Med Name: TADALAFIL '5MG'$  TABLETS] 90 tablet 1    Sig: TAKE 1 TABLET(5 MG) BY MOUTH DAILY     Urology: Erectile Dysfunction Agents Failed - 03/31/2022 10:14 AM      Failed - Last BP in normal range    BP Readings from Last 1 Encounters:  02/06/22 (!) 149/85         Passed - AST in normal range and within 360 days    AST  Date Value Ref Range Status  11/06/2021 23 0 - 40 IU/L Final         Passed - ALT in normal range and within 360 days    ALT  Date Value Ref Range Status  11/06/2021 33 0 - 44 IU/L Final         Passed - Valid encounter within last 12 months    Recent Outpatient Visits           1 month ago Controlled type 2 diabetes mellitus with microalbuminuria, without long-term current use of insulin ([place])   [place], Megan P, DO   4 months ago Routine general medical examination at a health care facility   [facility], [place] P, DO   8 months ago Osteoarthritis of spine with radiculopathy, lumbar region   [facility], Megan P, DO   9 months ago [place], Lauren A, NP   1 year ago Benign prostatic hyperplasia with incomplete bladder emptying   [facility] [place], Barb Merino, DO       Future Appointments             In 1 month [place], Barb Merino, DO [facility], [facility]

## 2022-04-26 DIAGNOSIS — M533 Sacrococcygeal disorders, not elsewhere classified: Secondary | ICD-10-CM | POA: Diagnosis not present

## 2022-04-26 DIAGNOSIS — Z6841 Body Mass Index (BMI) 40.0 and over, adult: Secondary | ICD-10-CM | POA: Diagnosis not present

## 2022-05-09 ENCOUNTER — Encounter: Payer: Self-pay | Admitting: Family Medicine

## 2022-05-09 ENCOUNTER — Ambulatory Visit (INDEPENDENT_AMBULATORY_CARE_PROVIDER_SITE_OTHER): Payer: PPO | Admitting: Family Medicine

## 2022-05-09 VITALS — BP 132/78 | HR 55 | Temp 98.2°F | Wt 244.7 lb

## 2022-05-09 DIAGNOSIS — N401 Enlarged prostate with lower urinary tract symptoms: Secondary | ICD-10-CM

## 2022-05-09 DIAGNOSIS — E291 Testicular hypofunction: Secondary | ICD-10-CM

## 2022-05-09 DIAGNOSIS — G8929 Other chronic pain: Secondary | ICD-10-CM

## 2022-05-09 DIAGNOSIS — M545 Low back pain, unspecified: Secondary | ICD-10-CM | POA: Diagnosis not present

## 2022-05-09 DIAGNOSIS — E1129 Type 2 diabetes mellitus with other diabetic kidney complication: Secondary | ICD-10-CM | POA: Diagnosis not present

## 2022-05-09 DIAGNOSIS — R809 Proteinuria, unspecified: Secondary | ICD-10-CM | POA: Diagnosis not present

## 2022-05-09 DIAGNOSIS — R3914 Feeling of incomplete bladder emptying: Secondary | ICD-10-CM | POA: Diagnosis not present

## 2022-05-09 DIAGNOSIS — I129 Hypertensive chronic kidney disease with stage 1 through stage 4 chronic kidney disease, or unspecified chronic kidney disease: Secondary | ICD-10-CM

## 2022-05-09 LAB — BAYER DCA HB A1C WAIVED: HB A1C (BAYER DCA - WAIVED): 6.3 % — ABNORMAL HIGH (ref 4.8–5.6)

## 2022-05-09 MED ORDER — DICLOFENAC SODIUM 75 MG PO TBEC
75.0000 mg | DELAYED_RELEASE_TABLET | Freq: Two times a day (BID) | ORAL | 3 refills | Status: DC
Start: 2022-05-09 — End: 2022-11-13

## 2022-05-09 MED ORDER — HYDROCHLOROTHIAZIDE 12.5 MG PO CAPS: 12.5000 mg | ORAL_CAPSULE | Freq: Every day | ORAL | 1 refills | Status: DC

## 2022-05-09 MED ORDER — TADALAFIL 5 MG PO TABS: 5.0000 mg | ORAL_TABLET | Freq: Every day | ORAL | 1 refills | Status: DC

## 2022-05-09 MED ORDER — TRAMADOL HCL 50 MG PO TABS: ORAL_TABLET | ORAL | 2 refills | Status: DC

## 2022-05-09 MED ORDER — BUPROPION HCL ER (SR) 200 MG PO TB12
200.0000 mg | ORAL_TABLET | Freq: Two times a day (BID) | ORAL | 1 refills | Status: DC
Start: 2022-05-09 — End: 2022-05-09

## 2022-05-09 MED ORDER — BUPROPION HCL ER (SR) 200 MG PO TB12: 200.0000 mg | ORAL_TABLET | Freq: Two times a day (BID) | ORAL | 1 refills | Status: DC

## 2022-05-09 MED ORDER — METOPROLOL SUCCINATE ER 100 MG PO TB24: ORAL_TABLET | ORAL | 1 refills | Status: DC

## 2022-05-09 MED ORDER — BENAZEPRIL HCL 40 MG PO TABS: 40.0000 mg | ORAL_TABLET | Freq: Every day | ORAL | 1 refills | Status: DC

## 2022-05-09 MED ORDER — METFORMIN HCL ER 500 MG PO TB24
ORAL_TABLET | ORAL | 1 refills | Status: DC
Start: 2022-05-09 — End: 2022-05-09

## 2022-05-09 MED ORDER — METOPROLOL SUCCINATE ER 100 MG PO TB24
ORAL_TABLET | ORAL | 1 refills | Status: DC
Start: 2022-05-09 — End: 2022-05-09

## 2022-05-09 MED ORDER — GABAPENTIN 300 MG PO CAPS
ORAL_CAPSULE | ORAL | 1 refills | Status: DC
Start: 2022-05-09 — End: 2022-11-13

## 2022-05-09 MED ORDER — METFORMIN HCL ER 500 MG PO TB24
ORAL_TABLET | ORAL | 1 refills | Status: DC
Start: 2022-05-09 — End: 2022-11-13

## 2022-05-09 MED ORDER — GABAPENTIN 300 MG PO CAPS
ORAL_CAPSULE | ORAL | 1 refills | Status: DC
Start: 2022-05-09 — End: 2022-05-09

## 2022-05-09 NOTE — Progress Notes (Signed)
BP 132/78   Pulse (!) 55   Temp 98.2 F (36.8 C)   Wt 244 lb 11.2 oz (111 kg)   SpO2 97%   BMI 38.33 kg/m    Subjective:    Patient ID: Andrew [name], male    DOB: 1956/08/20, 66 y.o.   MRN: [id number]  HPI: Andrew [name] is a 66 y.o. male  Chief Complaint  Patient presents with   Diabetes   Hypertension   Benign Prostatic Hypertrophy   HYPERTENSION / [place] Satisfied with current treatment? yes Duration of hypertension: chronic BP monitoring frequency: not checking BP medication side effects: no Past BP meds: benazpril, hctz, metoprolol Duration of hyperlipidemia: chronic Cholesterol medication side effects: no Cholesterol supplements: none Past cholesterol medications: none Medication compliance: excellent compliance Aspirin: no Recent stressors: no Recurrent headaches: no Visual changes: no Palpitations: no Dyspnea: no Chest pain: no Lower extremity edema: no Dizzy/lightheaded: no  BPH BPH status: controlled Satisfied with current treatment?: yes Medication side effects: no Medication compliance: excellent compliance Duration: chronic Nocturia: 1-2x per night Urinary frequency:no Incomplete voiding: no Urgency: no Weak urinary stream: no Straining to start stream: no Dysuria: no Onset: gradual Severity: mild  LOW TESTOSTERONE Duration: chronic Status: controlled  Satisfied with current treatment:  yes Previous testosterone therapies: androgel and IM  Medication side effects:  no Medication compliance: excellent compliance Decreased libido: no Fatigue: no Depressed mood: no Muscle weakness: no Erectile dysfunction: no  DIABETES Hypoglycemic episodes:no Polydipsia/polyuria: no Visual disturbance: no Chest pain: no Paresthesias: no Glucose Monitoring: no  Accucheck frequency: Not Checking Taking Insulin?: no Blood Pressure Monitoring: not checking Retinal Examination: Not up to Date Foot Exam: Not up to Date Diabetic  Education: Completed Pneumovax: Up to Date Influenza: Up to Date Aspirin: no  CHRONIC PAIN  Pain control status: controlled Duration: chronic Location: low back pain Quality: aching and sore Current Pain Level: mild Previous Pain Level: moderate Breakthrough pain: no Benefit from narcotic medications: yes What Activities task can be accomplished with current medication? Able to do his ADLs Interested in weaning off narcotics:yes   Stool softners/OTC fiber: no  Previous pain specialty evaluation: no Non-narcotic analgesic meds: yes Narcotic contract: yes   Relevant past medical, surgical, family and social history reviewed and updated as indicated. Interim medical history since our last visit reviewed. Allergies and medications reviewed and updated.  Review of Systems  Constitutional: Negative.   Respiratory: Negative.    Cardiovascular: Negative.   Gastrointestinal: Negative.   Musculoskeletal: Negative.   Neurological: Negative.   Psychiatric/Behavioral: Negative.      Per HPI unless specifically indicated above     Objective:    BP 132/78   Pulse (!) 55   Temp 98.2 F (36.8 C)   Wt 244 lb 11.2 oz (111 kg)   SpO2 97%   BMI 38.33 kg/m   Wt Readings from Last 3 Encounters:  05/09/22 244 lb 11.2 oz (111 kg)  02/06/22 255 lb (115.7 kg)  11/06/21 250 lb 6.4 oz (113.6 kg)    Physical Exam Vitals and nursing note reviewed.  Constitutional:      General: He is not in acute distress.    Appearance: Normal appearance. He is obese. He is not ill-appearing, toxic-appearing or diaphoretic.  HENT:     Head: Normocephalic and atraumatic.     Right Ear: External ear normal.     Left Ear: External ear normal.     Nose: Nose normal.     Mouth/Throat:  Mouth: Mucous membranes are moist.     Pharynx: Oropharynx is clear.  Eyes:     General: No scleral icterus.       Right eye: No discharge.        Left eye: No discharge.     Extraocular Movements: Extraocular  movements intact.     Conjunctiva/sclera: Conjunctivae normal.     Pupils: Pupils are equal, round, and reactive to light.  Cardiovascular:     Rate and Rhythm: Normal rate and regular rhythm.     Pulses: Normal pulses.     Heart sounds: Normal heart sounds. No murmur heard.    No friction rub. No gallop.  Pulmonary:     Effort: Pulmonary effort is normal. No respiratory distress.     Breath sounds: Normal breath sounds. No stridor. No wheezing, rhonchi or rales.  Chest:     Chest wall: No tenderness.  Musculoskeletal:        General: Normal range of motion.     Cervical back: Normal range of motion and neck supple.  Skin:    General: Skin is warm and dry.     Capillary Refill: Capillary refill takes less than 2 seconds.     Coloration: Skin is not jaundiced or pale.     Findings: No bruising, erythema, lesion or rash.  Neurological:     General: No focal deficit present.     Mental Status: He is alert and oriented to person, place, and time. Mental status is at baseline.  Psychiatric:        Mood and Affect: Mood normal.        Behavior: Behavior normal.        Thought Content: Thought content normal.        Judgment: Judgment normal.     Results for orders placed or performed in visit on 05/09/22  Bayer DCA Hb A1c Waived  Result Value Ref Range   HB A1C (BAYER DCA - WAIVED) 6.3 (H) 4.8 - 5.6 %  Comprehensive metabolic panel  Result Value Ref Range   Glucose 151 (H) 70 - 99 mg/dL   BUN 19 8 - 27 mg/dL   Creatinine, Ser 0.99 0.76 - 1.27 mg/dL   eGFR 85 >59 mL/min/1.73   BUN/Creatinine Ratio 19 10 - 24   Sodium 139 134 - 144 mmol/L   Potassium 4.9 3.5 - 5.2 mmol/L   Chloride 101 96 - 106 mmol/L   CO2 24 20 - 29 mmol/L   Calcium 9.1 8.6 - 10.2 mg/dL   Total Protein 6.3 6.0 - 8.5 g/dL   Albumin 4.5 3.9 - 4.9 g/dL   Globulin, Total 1.8 1.5 - 4.5 g/dL   Albumin/Globulin Ratio 2.5 (H) 1.2 - 2.2   Bilirubin Total 1.3 (H) 0.0 - 1.2 mg/dL   Alkaline Phosphatase 73 44 - 121  IU/L   AST 20 0 - 40 IU/L   ALT 33 0 - 44 IU/L  CBC with Differential/Platelet  Result Value Ref Range   WBC 6.9 3.4 - 10.8 x10E3/uL   RBC 5.73 4.14 - 5.80 x10E6/uL   Hemoglobin 19.0 (H) 13.0 - 17.7 g/dL   Hematocrit 52.9 (H) 37.5 - 51.0 %   MCV 92 79 - 97 fL   MCH 33.2 (H) 26.6 - 33.0 pg   MCHC 35.9 (H) 31.5 - 35.7 g/dL   RDW 14.0 11.6 - 15.4 %   Platelets 158 150 - 450 x10E3/uL   Neutrophils 66 Not Estab. %   Lymphs  20 Not Estab. %   Monocytes 8 Not Estab. %   Eos 4 Not Estab. %   Basos 1 Not Estab. %   Neutrophils Absolute 4.6 1.4 - 7.0 x10E3/uL   Lymphocytes Absolute 1.3 0.7 - 3.1 x10E3/uL   Monocytes Absolute 0.6 0.1 - 0.9 x10E3/uL   EOS (ABSOLUTE) 0.3 0.0 - 0.4 x10E3/uL   Basophils Absolute 0.1 0.0 - 0.2 x10E3/uL   Immature Granulocytes 1 Not Estab. %   Immature Grans (Abs) 0.0 0.0 - 0.1 x10E3/uL  Lipid Panel w/o Chol/HDL Ratio  Result Value Ref Range   Cholesterol, Total 188 100 - 199 mg/dL   Triglycerides 163 (H) 0 - 149 mg/dL   HDL 34 (L) >39 mg/dL   VLDL Cholesterol Cal 29 5 - 40 mg/dL   LDL Chol Calc (NIH) 125 (H) 0 - 99 mg/dL  Testosterone, free, total([facility])  Result Value Ref Range   Testosterone 1,417 (H) 264 - 916 ng/dL   Testosterone, Free WILL FOLLOW    Sex Hormone Binding 29.5 19.3 - 76.4 nmol/L      Assessment & Plan:   Problem List Items Addressed This Visit       Endocrine   Hypogonadism in male    Under good control on current regimen. Continue current regimen. Continue to monitor. Call with any concerns. Labs drawn today.        Relevant Orders   Testosterone, free, total([facility]) (Completed)   Controlled type 2 diabetes mellitus with microalbuminuria ([place])    Doing well with A1c of 6.3. Continue current regimen. Continue to monitor. Call with any concerns. Refills given today.       Relevant Medications   metFORMIN (GLUCOPHAGE-XR) 500 MG 24 hr tablet   benazepril (LOTENSIN) 40 MG tablet   Other Relevant Orders    Bayer DCA Hb A1c Waived (Completed)   Comprehensive metabolic panel (Completed)   CBC with Differential/Platelet (Completed)   Lipid Panel w/o Chol/HDL Ratio (Completed)     Genitourinary   Benign hypertensive renal disease - Primary    Under good control on current regimen. Continue current regimen. Continue to monitor. Call with any concerns. Refills given. Labs drawn today.       Relevant Orders   Comprehensive metabolic panel (Completed)   Benign prostatic hyperplasia with incomplete bladder emptying    Under good control on current regimen. Continue current regimen. Continue to monitor. Call with any concerns. Refills given. Labs drawn today.         Other   Chronic low back pain    Under good control on current regimen. Continue current regimen. Continue to monitor. Call with any concerns. Refills given for 3 months. Follow up 3 months.        Relevant Medications   traMADol (ULTRAM) 50 MG tablet   gabapentin (NEURONTIN) 300 MG capsule   diclofenac (VOLTAREN) 75 MG EC tablet   buPROPion (WELLBUTRIN SR) 200 MG 12 hr tablet     Follow up plan: Return for 3 months virtual, 6 months physical.

## 2022-05-10 ENCOUNTER — Other Ambulatory Visit: Payer: Self-pay | Admitting: Family Medicine

## 2022-05-10 MED ORDER — TESTOSTERONE CYPIONATE 100 MG/ML IJ SOLN: 100.0000 mg | INTRAMUSCULAR | 2 refills | Status: DC

## 2022-05-13 NOTE — Assessment & Plan Note (Signed)
Doing well with A1c of 6.3. Continue current regimen. Continue to monitor. Call with any concerns. Refills given today.

## 2022-05-13 NOTE — Assessment & Plan Note (Signed)
Under good control on current regimen. Continue current regimen. Continue to monitor. Call with any concerns. Refills given. Labs drawn today.   

## 2022-05-13 NOTE — Assessment & Plan Note (Signed)
Under good control on current regimen. Continue current regimen. Continue to monitor. Call with any concerns. Refills given for 3 months. Follow up 3 months.    

## 2022-05-13 NOTE — Assessment & Plan Note (Signed)
Under good control on current regimen. Continue current regimen. Continue to monitor. Call with any concerns. Labs drawn today.  

## 2022-05-14 ENCOUNTER — Telehealth: Payer: Self-pay

## 2022-05-14 NOTE — Telephone Encounter (Signed)
PA initiated via CoverMyMeds for Testosterone Cypionate '100MG'$ /ML  KEY : [id number] Waiting on determination

## 2022-05-14 NOTE — Telephone Encounter (Signed)
-----   Message from Valerie Roys, DO sent at 05/09/2022  9:58 AM EDT ----- Needs PA on testosterone

## 2022-05-15 LAB — CBC WITH DIFFERENTIAL/PLATELET
Basophils Absolute: 0.1 10*3/uL (ref 0.0–0.2)
Basos: 1 %
EOS (ABSOLUTE): 0.3 10*3/uL (ref 0.0–0.4)
Eos: 4 %
Hematocrit: 52.9 % — ABNORMAL HIGH (ref 37.5–51.0)
Hemoglobin: 19 g/dL — ABNORMAL HIGH (ref 13.0–17.7)
Immature Grans (Abs): 0 10*3/uL (ref 0.0–0.1)
Immature Granulocytes: 1 %
Lymphocytes Absolute: 1.3 10*3/uL (ref 0.7–3.1)
Lymphs: 20 %
MCH: 33.2 pg — ABNORMAL HIGH (ref 26.6–33.0)
MCHC: 35.9 g/dL — ABNORMAL HIGH (ref 31.5–35.7)
MCV: 92 fL (ref 79–97)
Monocytes Absolute: 0.6 10*3/uL (ref 0.1–0.9)
Monocytes: 8 %
Neutrophils Absolute: 4.6 10*3/uL (ref 1.4–7.0)
Neutrophils: 66 %
Platelets: 158 10*3/uL (ref 150–450)
RBC: 5.73 x10E6/uL (ref 4.14–5.80)
RDW: 14 % (ref 11.6–15.4)
WBC: 6.9 10*3/uL (ref 3.4–10.8)

## 2022-05-15 LAB — TESTOSTERONE, FREE, TOTAL, SHBG
Sex Hormone Binding: 29.5 nmol/L (ref 19.3–76.4)
Testosterone, Free: 29.9 pg/mL — ABNORMAL HIGH (ref 6.6–18.1)
Testosterone: 1417 ng/dL — ABNORMAL HIGH (ref 264–916)

## 2022-05-15 LAB — LIPID PANEL W/O CHOL/HDL RATIO
Cholesterol, Total: 188 mg/dL (ref 100–199)
HDL: 34 mg/dL — ABNORMAL LOW (ref >39)
LDL Chol Calc (NIH): 125 mg/dL — ABNORMAL HIGH (ref 0–99)
Triglycerides: 163 mg/dL — ABNORMAL HIGH (ref 0–149)
VLDL Cholesterol Cal: 29 mg/dL (ref 5–40)

## 2022-05-15 LAB — COMPREHENSIVE METABOLIC PANEL
ALT: 33 IU/L (ref 0–44)
AST: 20 IU/L (ref 0–40)
Albumin/Globulin Ratio: 2.5 — ABNORMAL HIGH (ref 1.2–2.2)
Albumin: 4.5 g/dL (ref 3.9–4.9)
Alkaline Phosphatase: 73 IU/L (ref 44–121)
BUN/Creatinine Ratio: 19 (ref 10–24)
BUN: 19 mg/dL (ref 8–27)
Bilirubin Total: 1.3 mg/dL — ABNORMAL HIGH (ref 0.0–1.2)
CO2: 24 mmol/L (ref 20–29)
Calcium: 9.1 mg/dL (ref 8.6–10.2)
Chloride: 101 mmol/L (ref 96–106)
Creatinine, Ser: 0.99 mg/dL (ref 0.76–1.27)
Globulin, Total: 1.8 g/dL (ref 1.5–4.5)
Glucose: 151 mg/dL — ABNORMAL HIGH (ref 70–99)
Potassium: 4.9 mmol/L (ref 3.5–5.2)
Sodium: 139 mmol/L (ref 134–144)
Total Protein: 6.3 g/dL (ref 6.0–8.5)
eGFR: 85 mL/min/{1.73_m2} (ref >59)

## 2022-05-31 NOTE — Telephone Encounter (Signed)
Faxed PA manually, waiting on response.

## 2022-06-12 ENCOUNTER — Ambulatory Visit: Payer: Self-pay

## 2022-06-12 NOTE — Patient Instructions (Signed)
Visit Information  Thank you for taking time to visit with me today. Please don't hesitate to contact me if I can be of assistance to you.   Following are the goals we discussed today:   Goals Addressed             This Visit's Progress    COMPLETED: RNCM: Effective Management of health and well being       Care Coordination Interventions: Evaluation of current treatment plan related to chronic health conditions and effective management of health and well being and patient's adherence to plan as established by provider Advised patient to call the office for changes in his chronic conditions, questions, or concerns Reviewed medications with patient and discussed the patient wanted to let the [facility] know that he told the pcp at the office last month that he gave her the wrong information on his insurance. He has not heard from his prescriptions yet. Advised the patient to call the mail order service and inquire.  Collaborated with pcp and clinical  regarding the patient asking the RNCM to let the pcp know that he provided the wrong information about his healthcare coverage and he needed to correct it for his medications. In basket message sent to the pcp and clinical team letting them know the information from the patient. Reviewed scheduled/upcoming provider appointments including 08-09-2022 at [place] [facility] Advised patient to discuss changes in his health and well being, new questions or concerns with provider Screening for signs and symptoms of depression related to chronic disease state  Assessed social determinant of health barriers Review of the care coordination program and advised the patient if he needs any further assistance to call the RNCM. Education and support provided.              Please call the care guide team at [phone] if you need to schedule an appointment.   If you are experiencing a Mental Health or [place] or need someone to talk to, please call  the Suicide and Crisis Lifeline: 988 call the [place] National Suicide Prevention Lifeline: 401-671-0728 or TTY: 708-403-3055 TTY 782-118-5596) to talk to a trained counselor call 1-800-273-TALK (toll free, 24 hour hotline)  Patient verbalizes understanding of instructions and care plan provided today and agrees to view in [place]. Active MyChart status and patient understanding of how to access instructions and care plan via MyChart confirmed with patient.     No further follow up required: the patient knows how to reach the [facility] concerns, questions, or new needs.  Noreene Larsson RN, MSN, CCM Community Care Coordinator [facility] Mobile: [phone]

## 2022-06-12 NOTE — Patient Outreach (Signed)
  Care Coordination   Initial Visit Note   06/12/2022 Name: Andrew [name] MRN: [id number] DOB: 05/23/1956  Andrew [name] is a 66 y.o. year old male who sees Valerie Roys, DO for primary care. I spoke with  Andrew [name] by phone today.  What matters to the patients health and wellness today?  Questions about medications    Goals Addressed             This Visit's Progress    COMPLETED: RNCM: Effective Management of health and well being       Care Coordination Interventions: Evaluation of current treatment plan related to chronic health conditions and effective management of health and well being and patient's adherence to plan as established by provider Advised patient to call the office for changes in his chronic conditions, questions, or concerns Reviewed medications with patient and discussed the patient wanted to let the [facility] know that he told the pcp at the office last month that he gave her the wrong information on his insurance. He has not heard from his prescriptions yet. Advised the patient to call the mail order service and inquire.  Collaborated with pcp and clinical  regarding the patient asking the RNCM to let the pcp know that he provided the wrong information about his healthcare coverage and he needed to correct it for his medications. In basket message sent to the pcp and clinical team letting them know the information from the patient. Reviewed scheduled/upcoming provider appointments including 08-09-2022 at [place] [facility] Advised patient to discuss changes in his health and well being, new questions or concerns with provider Screening for signs and symptoms of depression related to chronic disease state  Assessed social determinant of health barriers Review of the care coordination program and advised the patient if he needs any further assistance to call the RNCM. Education and support provided.            SDOH assessments and interventions completed:   Yes  SDOH Interventions Today    Flowsheet Row Most Recent Value  SDOH Interventions   Food Insecurity Interventions Intervention Not Indicated  Housing Interventions Intervention Not Indicated  Transportation Interventions Intervention Not Indicated  Utilities Interventions Intervention Not Indicated        Care Coordination Interventions Activated:  Yes  Care Coordination Interventions:  Yes, provided   Follow up plan: No further intervention required.   Encounter Outcome:  Pt. Visit Completed   Noreene Larsson RN, MSN, [facility] Mobile: [phone]

## 2022-06-26 ENCOUNTER — Other Ambulatory Visit: Payer: Self-pay | Admitting: Family Medicine

## 2022-06-26 NOTE — Telephone Encounter (Signed)
Requested Prescriptions  Pending Prescriptions Disp Refills  . tadalafil (CIALIS) 5 MG tablet [Pharmacy Med Name: TADALAFIL '5MG'$  TABLETS] 90 tablet 1    Sig: TAKE 1 TABLET(5 MG) BY MOUTH DAILY     Urology: Erectile Dysfunction Agents Passed - 06/26/2022 12:51 PM      Passed - AST in normal range and within 360 days    AST  Date Value Ref Range Status  05/09/2022 20 0 - 40 IU/L Final         Passed - ALT in normal range and within 360 days    ALT  Date Value Ref Range Status  05/09/2022 33 0 - 44 IU/L Final         Passed - Last BP in normal range    BP Readings from Last 1 Encounters:  05/09/22 132/78         Passed - Valid encounter within last 12 months    Recent Outpatient Visits          1 month ago Benign hypertensive renal disease   [facility] [place], Megan P, DO   4 months ago Controlled type 2 diabetes mellitus with microalbuminuria, without long-term current use of insulin ([place])   [place], Megan P, DO   7 months ago Routine general medical examination at a health care facility   [facility], [place] P, DO   11 months ago Osteoarthritis of spine with radiculopathy, lumbar region   [facility], [place], DO   12 months ago [place], Scheryl Darter, NP      Future Appointments            In 1 month Johnson, Barb Merino, DO [employer], [place]   In 4 months Johnson, Barb Merino, DO [employer], [facility]

## 2022-07-12 ENCOUNTER — Encounter: Payer: Self-pay | Admitting: Family Medicine

## 2022-07-12 ENCOUNTER — Other Ambulatory Visit: Payer: Self-pay

## 2022-07-12 NOTE — Telephone Encounter (Signed)
Medication refill for Tramadol 50 mg and Gabapentin 300 mg last ov 05/09/22, upcoming ov 08/09/22 . Please advise

## 2022-07-14 DIAGNOSIS — E119 Type 2 diabetes mellitus without complications: Secondary | ICD-10-CM | POA: Diagnosis not present

## 2022-07-14 DIAGNOSIS — H9193 Unspecified hearing loss, bilateral: Secondary | ICD-10-CM | POA: Diagnosis not present

## 2022-07-14 DIAGNOSIS — N529 Male erectile dysfunction, unspecified: Secondary | ICD-10-CM | POA: Diagnosis not present

## 2022-07-14 DIAGNOSIS — E291 Testicular hypofunction: Secondary | ICD-10-CM | POA: Diagnosis not present

## 2022-07-14 DIAGNOSIS — I10 Essential (primary) hypertension: Secondary | ICD-10-CM | POA: Diagnosis not present

## 2022-07-14 DIAGNOSIS — M1990 Unspecified osteoarthritis, unspecified site: Secondary | ICD-10-CM | POA: Diagnosis not present

## 2022-07-14 DIAGNOSIS — N400 Enlarged prostate without lower urinary tract symptoms: Secondary | ICD-10-CM | POA: Diagnosis not present

## 2022-07-14 DIAGNOSIS — G8929 Other chronic pain: Secondary | ICD-10-CM | POA: Diagnosis not present

## 2022-07-14 DIAGNOSIS — Z87891 Personal history of nicotine dependence: Secondary | ICD-10-CM | POA: Diagnosis not present

## 2022-07-17 ENCOUNTER — Telehealth: Payer: Self-pay | Admitting: Family Medicine

## 2022-07-17 NOTE — Telephone Encounter (Signed)
Nor w/ [facility] would like to know if the pt has ever tried a statin due to his diabetes and heart issues?  Nor also asked that someone reach out to him concerning his tramadol.  I spoke to the pharmacy [facility]. They told me the pt's script needs a prior authorization.  [facility] is sending that over now. The pharmacist says the scripts are still on file. Cb [phone]

## 2022-07-18 NOTE — Telephone Encounter (Signed)
Returned call to Nor and notified has not been on statin.  Will call patient in regards to Tramadol.

## 2022-07-20 ENCOUNTER — Other Ambulatory Visit: Payer: Self-pay

## 2022-07-20 MED ORDER — TRAMADOL HCL 50 MG PO TABS
ORAL_TABLET | ORAL | 0 refills | Status: DC
Start: 2022-07-20 — End: 2022-08-01

## 2022-07-20 NOTE — Telephone Encounter (Signed)
Controlled substances cannot be sent to mail order.

## 2022-07-20 NOTE — Telephone Encounter (Signed)
Patient was not able to pick up medication due to insurance restrictions. Per insurance since patient has never had Tramadol filled with this new insurance it has to be sent in for a 7 day supply first, then patient will be able to fill for 30 days due to medication being an opoid.  Patient would like medication sent to [facility].

## 2022-07-30 ENCOUNTER — Other Ambulatory Visit: Payer: Self-pay | Admitting: Family Medicine

## 2022-07-30 ENCOUNTER — Telehealth: Payer: Self-pay | Admitting: Family Medicine

## 2022-07-30 NOTE — Telephone Encounter (Signed)
Patient states he needs authorization from PCP to [facility] stating he can take gabapentin and tramadol together. Per PCP tramadol can not be sent to mail order, patient understood why it was sent to [facility].  Requesting refill of testosterone injection sent to [facility].

## 2022-07-30 NOTE — Telephone Encounter (Signed)
Testosterone is also a controlled substance and cannot be sent via mail order. Do they need a note or a verbal that it's OK for him to take them together?

## 2022-07-30 NOTE — Telephone Encounter (Signed)
Medication Refill - Medication: Testosterone Cypionate 100 MG/ML SOLN [[id number]]   Has the patient contacted their pharmacy? Yes.   (Agent: If no, request that the patient contact the pharmacy for the refill. If patient does not wish to contact the pharmacy document the reason why and proceed with request.) (Agent: If yes, when and what did the pharmacy advise?)  Preferred Pharmacy (with phone number or street name):  [facility]) - [place], [address]  [place] [address]  Phone: [phone] Fax: [phone]  Hours: Not open 24 hours   Has the patient been seen for an appointment in the last year OR does the patient have an upcoming appointment? Yes.    Agent: Please be advised that RX refills may take up to 3 business days. We ask that you follow-up with your pharmacy.

## 2022-07-30 NOTE — Telephone Encounter (Signed)
Pt is calling to report that the [facility] [phone]) is calling to report needing ok to fill gabapentin (NEURONTIN) 300 MG capsule [[id number]]  &  traMADol (ULTRAM) 50 MG tablet [[id number]] Please advise CB-  [facility] ([place], [address]  [place] [address]  Phone: [phone] Fax: [phone]  Hours: Not open 24 hours

## 2022-08-01 NOTE — Telephone Encounter (Signed)
Please send 7 day supply to [facility] and Testosterone refill to [facility] as well.

## 2022-08-01 NOTE — Telephone Encounter (Signed)
Attempted to contact patient to notify of provider advise, medication has to sent to a local pharmacy.  NA unable to LVM

## 2022-08-02 MED ORDER — TRAMADOL HCL 50 MG PO TABS
ORAL_TABLET | ORAL | 0 refills | Status: DC
Start: 2022-08-02 — End: 2022-08-14

## 2022-08-02 MED ORDER — TESTOSTERONE CYPIONATE 100 MG/ML IJ SOLN: 100.0000 mg | INTRAMUSCULAR | 2 refills | Status: DC

## 2022-08-09 ENCOUNTER — Telehealth: Payer: PPO | Admitting: Family Medicine

## 2022-08-14 ENCOUNTER — Ambulatory Visit (INDEPENDENT_AMBULATORY_CARE_PROVIDER_SITE_OTHER): Payer: PPO | Admitting: Family Medicine

## 2022-08-14 ENCOUNTER — Encounter: Payer: Self-pay | Admitting: Family Medicine

## 2022-08-14 VITALS — BP 134/81 | HR 52 | Temp 98.5°F | Ht 67.0 in | Wt 252.3 lb

## 2022-08-14 DIAGNOSIS — M545 Low back pain, unspecified: Secondary | ICD-10-CM

## 2022-08-14 DIAGNOSIS — E291 Testicular hypofunction: Secondary | ICD-10-CM

## 2022-08-14 DIAGNOSIS — G8929 Other chronic pain: Secondary | ICD-10-CM | POA: Diagnosis not present

## 2022-08-14 DIAGNOSIS — Z23 Encounter for immunization: Secondary | ICD-10-CM | POA: Diagnosis not present

## 2022-08-14 DIAGNOSIS — R809 Proteinuria, unspecified: Secondary | ICD-10-CM | POA: Diagnosis not present

## 2022-08-14 DIAGNOSIS — E1129 Type 2 diabetes mellitus with other diabetic kidney complication: Secondary | ICD-10-CM

## 2022-08-14 DIAGNOSIS — I83813 Varicose veins of bilateral lower extremities with pain: Secondary | ICD-10-CM | POA: Insufficient documentation

## 2022-08-14 DIAGNOSIS — Z87891 Personal history of nicotine dependence: Secondary | ICD-10-CM | POA: Diagnosis not present

## 2022-08-14 LAB — BAYER DCA HB A1C WAIVED: HB A1C (BAYER DCA - WAIVED): 7.5 % — ABNORMAL HIGH (ref 4.8–5.6)

## 2022-08-14 MED ORDER — TRAMADOL HCL 50 MG PO TABS: ORAL_TABLET | ORAL | 2 refills | Status: DC

## 2022-08-14 MED ORDER — TADALAFIL 5 MG PO TABS: ORAL_TABLET | ORAL | 1 refills | Status: DC

## 2022-08-14 NOTE — Progress Notes (Deleted)
There were no vitals taken for this visit.   Subjective:    Patient ID: Andrew [name], male    DOB: 1956/01/09, 66 y.o.   MRN: [id number]  HPI: Andrew [name] is a 66 y.o. male presenting on 08/14/2022 for comprehensive medical examination. Current medical complaints include:{Blank single:19197::"none","***"}  He currently lives with: Interim Problems from his last visit: {Blank single:19197::"yes","no"}  Functional Status Survey:    FALL RISK:    06/30/2021   11:26 AM 05/30/2020    8:21 AM 10/08/2019    2:33 PM 12/16/2017    8:42 AM  Fall Risk   Falls in the past year? 0 1 0 No  Number falls in past yr: 0 0 0   Injury with Fall? 0 1 0   Risk for fall due to : No Fall Risks     Follow up Falls evaluation completed       Depression Screen    05/09/2022    9:36 AM 02/06/2022    8:32 AM 11/06/2021    9:54 AM 07/17/2021    8:53 AM 06/30/2021   11:26 AM  Depression screen PHQ 2/9  Decreased Interest 0 1 0 2 0  Down, Depressed, Hopeless 0 0 0 0 0  PHQ - 2 Score 0 1 0 2 0  Altered sleeping 1 0 0 0 0  Tired, decreased energy 0 1 0 2 0  Change in appetite 0 1 0 1 0  Feeling bad or failure about yourself  0 0 0 0 0  Trouble concentrating 0 0 0 0 0  Moving slowly or fidgety/restless 0 0 0 0 0  Suicidal thoughts 0 0 0 0 0  PHQ-9 Score 1 3 0 5 0  Difficult doing work/chores Not difficult at all        Advanced Directives <no information>  Past Medical History:  Past Medical History:  Diagnosis Date   Chronic pain syndrome    Depression    Diverticulosis    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, was on anticoags for 83month   Ear infection 08/2016   CLEARING UP-FINISHED ANTIBIOTIC AND PREDNISONE TAPER    Family history of adverse reaction to anesthesia    BROTHER-N/V   Hernia, umbilical    History of kidney stones    Hyperlipidemia    Hypertension    IFG (impaired fasting glucose)    Knee pain, chronic    Pes anserine bursitis     Surgical History:   Past Surgical History:  Procedure Laterality Date   COLONOSCOPY  2015   JOINT REPLACEMENT Bilateral 2009   replacement and revision   KIDNEY STONE SURGERY     SHOULDER ARTHROSCOPY WITH ROTATOR CUFF REPAIR Right    UMBILICAL HERNIA REPAIR N/A 09/06/2016   Procedure: HERNIA REPAIR UMBILICAL ADULT;  Surgeon: BRobert Bellow MD;  Location: [facility] ORS;  Service: General;  Laterality: N/A;    Medications:  Current Outpatient Medications on File Prior to Visit  Medication Sig   benazepril (LOTENSIN) 40 MG tablet Take 1 tablet (40 mg total) by mouth daily.   buPROPion (WELLBUTRIN SR) 200 MG 12 hr tablet Take 1 tablet (200 mg total) by mouth 2 (two) times daily.   diclofenac (VOLTAREN) 75 MG EC tablet Take 1 tablet (75 mg total) by mouth 2 (two) times daily.   gabapentin (NEURONTIN) 300 MG capsule TAKE 2 CAPSULES(600 MG) BY MOUTH TWICE DAILY   hydrochlorothiazide (MICROZIDE) 12.5 MG capsule Take 1 capsule (  12.5 mg total) by mouth daily.   metFORMIN (GLUCOPHAGE-XR) 500 MG 24 hr tablet TAKE 2 TABLETS(1000 MG) BY MOUTH TWICE DAILY   metoprolol succinate (TOPROL-XL) 100 MG 24 hr tablet TAKE 1 TABLET BY MOUTH  DAILY WITH OR IMMEDIATELY  FOLLOWING A MEAL   sildenafil (VIAGRA) 100 MG tablet TAKE 1/2 TO 1 TABLET BY MOUTH DAILY AS NEEDED FOR ERECTILE DYSFUNCTION   tadalafil (CIALIS) 5 MG tablet TAKE 1 TABLET(5 MG) BY MOUTH DAILY   Testosterone Cypionate 100 MG/ML SOLN Inject 100 mg as directed every 14 (fourteen) days.   traMADol (ULTRAM) 50 MG tablet 2 tabs in the AM and 1 tab in the PM   No current facility-administered medications on file prior to visit.    Allergies:  Allergies  Allergen Reactions   Other Other (See Comments)    Adhesive tape gave blisters   Flomax [Tamsulosin] Other (See Comments)    Sexual dysfunction    Social History:  Social History   Socioeconomic History   Marital status: Married    Spouse name: Not on file   Number of children: Not on file   Years of  education: Not on file   Highest education level: Not on file  Occupational History   Not on file  Tobacco Use   Smoking status: Former    Packs/day: 1.50    Years: 25.00    Total pack years: 37.50    Types: Cigarettes    Quit date: 06/28/2011    Years since quitting: 11.1   Smokeless tobacco: Never  Vaping Use   Vaping Use: Never used  Substance and Sexual Activity   Alcohol use: Yes    Comment: on occasion   Drug use: No   Sexual activity: Yes    Birth control/protection: None  Other Topics Concern   Not on file  Social History Narrative   Not on file   Social Determinants of Health   Financial Resource Strain: Not on file  Food Insecurity: No Food Insecurity (06/12/2022)   Hunger Vital Sign    Worried About Running Out of Food in the Last Year: Never true    Ran Out of Food in the Last Year: Never true  Transportation Needs: No Transportation Needs (06/12/2022)   PRAPARE - Hydrologist (Medical): No    Lack of Transportation (Non-Medical): No  Physical Activity: Not on file  Stress: Not on file  Social Connections: Not on file  Intimate Partner Violence: Not At Risk (06/12/2022)   Humiliation, Afraid, Rape, and Kick questionnaire    Fear of Current or Ex-Partner: No    Emotionally Abused: No    Physically Abused: No    Sexually Abused: No   Social History   Tobacco Use  Smoking Status Former   Packs/day: 1.50   Years: 25.00   Total pack years: 37.50   Types: Cigarettes   Quit date: 06/28/2011   Years since quitting: 11.1  Smokeless Tobacco Never   Social History   Substance and Sexual Activity  Alcohol Use Yes   Comment: on occasion    Family History:  Family History  Problem Relation Age of Onset   Alcohol abuse Father    Hypertension Father    Cirrhosis Father    Hypertension Brother    Stroke Paternal Grandmother    Stroke Paternal Grandfather    Pneumonia Mother     Past medical history, surgical history,  medications, allergies, family history and social history reviewed  with patient today and changes made to appropriate areas of the chart.   ROS All other ROS negative except what is listed above and in the HPI.      Objective:    There were no vitals taken for this visit.  Wt Readings from Last 3 Encounters:  05/09/22 244 lb 11.2 oz (111 kg)  02/06/22 255 lb (115.7 kg)  11/06/21 250 lb 6.4 oz (113.6 kg)    No results found.  Physical Exam      No data to display          Cognitive Testing - 6-CIT  Correct? Score   What year is it? {YES NO:22349} {Numbers; 0-4:31231} Yes = 0    No = 4  What month is it? {YES NO:22349} {Numbers; 0-4:31231} Yes = 0    No = 3  Remember:     Pia Mau, [place], [place]     What time is it? {YES NO:22349} {Numbers; 0-4:31231} Yes = 0    No = 3  Count backwards from 20 to 1 {YES NO:22349} {Numbers; 0-4:31231} Correct = 0    1 error = 2   More than 1 error = 4  Say the months of the year in reverse. {YES NO:22349} {Numbers; 0-4:31231} Correct = 0    1 error = 2   More than 1 error = 4  What address did I ask you to remember? {YES NO:22349} {NUMBERS; 0-10:5044} Correct = 0  1 error = 2    2 error = 4    3 error = 6    4 error = 8    All wrong = 10       TOTAL SCORE  {Numbers; 8-18:56314}/97   Interpretation:  {Desc; normal/abnormal:11317::"Normal"}  Normal (0-7) Abnormal (8-28)    Results for orders placed or performed in visit on 05/09/22  Bayer DCA Hb A1c Waived  Result Value Ref Range   HB A1C (BAYER DCA - WAIVED) 6.3 (H) 4.8 - 5.6 %  Comprehensive metabolic panel  Result Value Ref Range   Glucose 151 (H) 70 - 99 mg/dL   BUN 19 8 - 27 mg/dL   Creatinine, Ser 0.99 0.76 - 1.27 mg/dL   eGFR 85 >59 mL/min/1.73   BUN/Creatinine Ratio 19 10 - 24   Sodium 139 134 - 144 mmol/L   Potassium 4.9 3.5 - 5.2 mmol/L   Chloride 101 96 - 106 mmol/L   CO2 24 20 - 29 mmol/L   Calcium 9.1 8.6 - 10.2 mg/dL   Total Protein 6.3 6.0 - 8.5 g/dL    Albumin 4.5 3.9 - 4.9 g/dL   Globulin, Total 1.8 1.5 - 4.5 g/dL   Albumin/Globulin Ratio 2.5 (H) 1.2 - 2.2   Bilirubin Total 1.3 (H) 0.0 - 1.2 mg/dL   Alkaline Phosphatase 73 44 - 121 IU/L   AST 20 0 - 40 IU/L   ALT 33 0 - 44 IU/L  CBC with Differential/Platelet  Result Value Ref Range   WBC 6.9 3.4 - 10.8 x10E3/uL   RBC 5.73 4.14 - 5.80 x10E6/uL   Hemoglobin 19.0 (H) 13.0 - 17.7 g/dL   Hematocrit 52.9 (H) 37.5 - 51.0 %   MCV 92 79 - 97 fL   MCH 33.2 (H) 26.6 - 33.0 pg   MCHC 35.9 (H) 31.5 - 35.7 g/dL   RDW 14.0 11.6 - 15.4 %   Platelets 158 150 - 450 x10E3/uL   Neutrophils 66 Not Estab. %  Lymphs 20 Not Estab. %   Monocytes 8 Not Estab. %   Eos 4 Not Estab. %   Basos 1 Not Estab. %   Neutrophils Absolute 4.6 1.4 - 7.0 x10E3/uL   Lymphocytes Absolute 1.3 0.7 - 3.1 x10E3/uL   Monocytes Absolute 0.6 0.1 - 0.9 x10E3/uL   EOS (ABSOLUTE) 0.3 0.0 - 0.4 x10E3/uL   Basophils Absolute 0.1 0.0 - 0.2 x10E3/uL   Immature Granulocytes 1 Not Estab. %   Immature Grans (Abs) 0.0 0.0 - 0.1 x10E3/uL  Lipid Panel w/o Chol/HDL Ratio  Result Value Ref Range   Cholesterol, Total 188 100 - 199 mg/dL   Triglycerides 163 (H) 0 - 149 mg/dL   HDL 34 (L) >39 mg/dL   VLDL Cholesterol Cal 29 5 - 40 mg/dL   LDL Chol Calc (NIH) 125 (H) 0 - 99 mg/dL  Testosterone, free, total([facility])  Result Value Ref Range   Testosterone 1,417 (H) 264 - 916 ng/dL   Testosterone, Free 29.9 (H) 6.6 - 18.1 pg/mL   Sex Hormone Binding 29.5 19.3 - 76.4 nmol/L      Assessment & Plan:   Problem List Items Addressed This Visit       Endocrine   Hypogonadism in male - Primary   Relevant Orders   Testosterone, free, total([facility])   Controlled type 2 diabetes mellitus with microalbuminuria ([facility])   Relevant Orders   Bayer DCA Hb A1c Waived     Preventative Services:  Health Risk Assessment and Personalized Prevention Plan: Bone Mass Measurements: CVD Screening:  Colon Cancer Screening:   Depression Screening:  Diabetes Screening:  Glaucoma Screening:  Hepatitis B vaccine: Hepatitis C screening:  HIV Screening: Flu Vaccine: Lung cancer Screening: Obesity Screening:  Pneumonia Vaccines (2): STI Screening: PSA screening:  Discussed aspirin prophylaxis for myocardial infarction prevention and decision was {Blank single:19197::"it was not indicated","made to continue ASA","made to start ASA","made to stop ASA","that we recommended ASA, and patient refused"}  LABORATORY TESTING:  Health maintenance labs ordered today as discussed above.   The natural history of prostate cancer and ongoing controversy regarding screening and potential treatment outcomes of prostate cancer has been discussed with the patient. The meaning of a false positive PSA and a false negative PSA has been discussed. He indicates understanding of the limitations of this screening test and wishes *** to proceed with screening PSA testing.   IMMUNIZATIONS:   - Tdap: Tetanus vaccination status reviewed: {tetanus status:315746}. - Influenza: {Blank single:19197::"Up to date","Administered today","Postponed to flu season","Refused","Given elsewhere"} - Pneumovax: {Blank single:19197::"Up to date","Administered today","Not applicable","Refused","Given elsewhere"} - Prevnar: {Blank single:19197::"Up to date","Administered today","Not applicable","Refused","Given elsewhere"} - Zostavax vaccine: {Blank single:19197::"Up to date","Administered today","Not applicable","Refused","Given elsewhere"}  SCREENING: - Colonoscopy: {Blank single:19197::"Up to date","Ordered today","Not applicable","Refused","Done elsewhere"}  Discussed with patient purpose of the colonoscopy is to detect colon cancer at curable precancerous or early stages   - AAA Screening: {Blank single:19197::"Up to date","Ordered today","Not applicable","Refused","Done elsewhere"}  -Hearing Test: {Blank single:19197::"Up to date","Ordered today","Not  applicable","Refused","Done elsewhere"}  -Spirometry: {Blank single:19197::"Up to date","Ordered today","Not applicable","Refused","Done elsewhere"}   PATIENT COUNSELING:    Sexuality: Discussed sexually transmitted diseases, partner selection, use of condoms, avoidance of unintended pregnancy  and contraceptive alternatives.   Advised to avoid cigarette smoking.  I discussed with the patient that most people either abstain from alcohol or drink within safe limits (<=14/week and <=4 drinks/occasion for males, <=7/weeks and <= 3 drinks/occasion for females) and that the risk for alcohol disorders and other health effects rises proportionally with the number of  drinks per week and how often a drinker exceeds daily limits.  Discussed cessation/primary prevention of drug use and availability of treatment for abuse.   Diet: Encouraged to adjust caloric intake to maintain  or achieve ideal body weight, to reduce intake of dietary saturated fat and total fat, to limit sodium intake by avoiding high sodium foods and not adding table salt, and to maintain adequate dietary potassium and calcium preferably from fresh fruits, vegetables, and low-fat dairy products.    stressed the importance of regular exercise  Injury prevention: Discussed safety belts, safety helmets, smoke detector, smoking near bedding or upholstery.   Dental health: Discussed importance of regular tooth brushing, flossing, and dental visits.   Follow up plan: NEXT PREVENTATIVE PHYSICAL DUE IN 1 YEAR. No follow-ups on file.

## 2022-08-14 NOTE — Assessment & Plan Note (Signed)
Elevated with A1c up to 7.5 from 6.3. Will really watch his diet and we'll recheck in 3 months. Call with any concerns.

## 2022-08-14 NOTE — Assessment & Plan Note (Signed)
Under good control on current regimen. Continue current regimen. Continue to monitor. Call with any concerns. Refills given for 3 months. Follow up 3 months. Encouraged patient to take medicine as needed rather than scheduled.

## 2022-08-14 NOTE — Assessment & Plan Note (Signed)
Referral to vascular made today. Await results.

## 2022-08-14 NOTE — Assessment & Plan Note (Signed)
Testosterone elevated last visit. Will check labs today. Await results. Treat as needed.

## 2022-08-14 NOTE — Progress Notes (Signed)
BP 134/81 (BP Location: Left Arm, Cuff Size: Normal)   Pulse (!) 52   Temp 98.5 F (36.9 C) (Oral)   Ht '5\' 7"'$  (1.702 m)   Wt 252 lb 4.8 oz (114.4 kg)   SpO2 97%   BMI 39.52 kg/m    Subjective:    Patient ID: Andrew [name], male    DOB: 05/26/1956, 66 y.o.   MRN: [id number]  HPI: Andrew [name] is a 66 y.o. male  Chief Complaint  Patient presents with   Leg Swelling    Patient says he recently noticed some swelling in both of his legs while on vacation last Wednesday. Patient has tried cold compress and elevating his legs. Patient denies having any pain or tingling. Patient says sometimes he has trouble getting his feet warm at night.    Diabetes   DIABETES Hypoglycemic episodes:no Polydipsia/polyuria: no Visual disturbance: no Chest pain: no Paresthesias: yes Glucose Monitoring: no  Accucheck frequency: Not Checking Taking Insulin?: no Blood Pressure Monitoring: not checking Retinal Examination: Up to Date Foot Exam: Up to Date Diabetic Education: Completed Pneumovax: Up to Date Influenza: Up to Date Aspirin: no  LOW TESTOSTERONE Duration: chronic Status: controlled  Satisfied with current treatment:  no Previous testosterone therapies: IM and androgel Medication side effects:  no Medication compliance: excellent compliance Decreased libido: no Fatigue: no Depressed mood: no Muscle weakness: no Erectile dysfunction: no  CHRONIC PAIN  Pain control status: uncontrolled Duration: chronic Location: knees and low back Quality: aching and sore Current Pain Level: moderate Previous Pain Level: mild Breakthrough pain: no Benefit from narcotic medications: yes What Activities task can be accomplished with current medication?able to do his ADLs Interested in weaning off narcotics:no   Stool softners/OTC fiber: no  Previous pain specialty evaluation: yes Non-narcotic analgesic meds: yes Narcotic contract: yes  Relevant past medical, surgical, family and  social history reviewed and updated as indicated. Interim medical history since our last visit reviewed. Allergies and medications reviewed and updated.  Review of Systems  Constitutional: Negative.   Respiratory: Negative.    Cardiovascular: Negative.   Gastrointestinal: Negative.   Musculoskeletal:  Positive for arthralgias, back pain and myalgias. Negative for gait problem, joint swelling, neck pain and neck stiffness.  Skin: Negative.   Neurological: Negative.   Psychiatric/Behavioral: Negative.      Per HPI unless specifically indicated above     Objective:    BP 134/81 (BP Location: Left Arm, Cuff Size: Normal)   Pulse (!) 52   Temp 98.5 F (36.9 C) (Oral)   Ht '5\' 7"'$  (1.702 m)   Wt 252 lb 4.8 oz (114.4 kg)   SpO2 97%   BMI 39.52 kg/m   Wt Readings from Last 3 Encounters:  08/14/22 252 lb 4.8 oz (114.4 kg)  05/09/22 244 lb 11.2 oz (111 kg)  02/06/22 255 lb (115.7 kg)    Physical Exam Vitals and nursing note reviewed.  Constitutional:      General: He is not in acute distress.    Appearance: Normal appearance. He is obese. He is not ill-appearing, toxic-appearing or diaphoretic.  HENT:     Head: Normocephalic and atraumatic.     Right Ear: External ear normal.     Left Ear: External ear normal.     Nose: Nose normal.     Mouth/Throat:     Mouth: Mucous membranes are moist.     Pharynx: Oropharynx is clear.  Eyes:     General: No scleral icterus.  Right eye: No discharge.        Left eye: No discharge.     Extraocular Movements: Extraocular movements intact.     Conjunctiva/sclera: Conjunctivae normal.     Pupils: Pupils are equal, round, and reactive to light.  Cardiovascular:     Rate and Rhythm: Normal rate and regular rhythm.     Pulses: Normal pulses.     Heart sounds: Normal heart sounds. No murmur heard.    No friction rub. No gallop.  Pulmonary:     Effort: Pulmonary effort is normal. No respiratory distress.     Breath sounds: Normal breath  sounds. No stridor. No wheezing, rhonchi or rales.  Chest:     Chest wall: No tenderness.  Musculoskeletal:        General: Normal range of motion.     Cervical back: Normal range of motion and neck supple.  Skin:    General: Skin is warm and dry.     Capillary Refill: Capillary refill takes less than 2 seconds.     Coloration: Skin is not jaundiced or pale.     Findings: No bruising, erythema, lesion or rash.     Comments: Varicose veins bilateral legs  Neurological:     General: No focal deficit present.     Mental Status: He is alert and oriented to person, place, and time. Mental status is at baseline.  Psychiatric:        Mood and Affect: Mood normal.        Behavior: Behavior normal.        Thought Content: Thought content normal.        Judgment: Judgment normal.     Results for orders placed or performed in visit on 08/14/22  Bayer DCA Hb A1c Waived  Result Value Ref Range   HB A1C (BAYER DCA - WAIVED) 7.5 (H) 4.8 - 5.6 %      Assessment & Plan:   Problem List Items Addressed This Visit       Cardiovascular and Mediastinum   Varicose veins of both lower extremities with pain    Referral to vascular made today. Await results.       Relevant Medications   tadalafil (CIALIS) 5 MG tablet   Other Relevant Orders   Ambulatory referral to Vascular Surgery     Endocrine   Hypogonadism in male - Primary    Testosterone elevated last visit. Will check labs today. Await results. Treat as needed.       Relevant Orders   Testosterone, free, total([facility])   Controlled type 2 diabetes mellitus with microalbuminuria (HCC)    Elevated with A1c up to 7.5 from 6.3. Will really watch his diet and we'll recheck in 3 months. Call with any concerns.       Relevant Orders   Bayer DCA Hb A1c Waived (Completed)     Other   Chronic low back pain    Under good control on current regimen. Continue current regimen. Continue to monitor. Call with any concerns. Refills  given for 3 months. Follow up 3 months. Encouraged patient to take medicine as needed rather than scheduled.       Relevant Medications   traMADol (ULTRAM) 50 MG tablet   Other Visit Diagnoses     History of smoking 30 or more pack years       Low dose CT ordered. Await results.   Relevant Orders   CT CHEST LUNG CA SCREEN LOW DOSE W/O CM  Flu vaccine need       Flu shot given today.        Follow up plan: Return in about 3 months (around 11/14/2022) for welcome to medicare .

## 2022-08-16 ENCOUNTER — Other Ambulatory Visit: Payer: Self-pay | Admitting: Family Medicine

## 2022-08-16 ENCOUNTER — Telehealth: Payer: Self-pay | Admitting: Family Medicine

## 2022-08-16 MED ORDER — TESTOSTERONE CYPIONATE 100 MG/ML IM SOLN: 100.0000 mg | INTRAMUSCULAR | 5 refills | Status: DC

## 2022-08-16 NOTE — Telephone Encounter (Signed)
Megan from the pharmacy called to report that they need the prescription rewritten to accommodate what they have in stock or sent elsewhere   She says they can do 200 MG/Ml with injecting half Ml as the instruction

## 2022-08-17 ENCOUNTER — Other Ambulatory Visit: Payer: Self-pay

## 2022-08-17 ENCOUNTER — Telehealth: Payer: Self-pay

## 2022-08-17 MED ORDER — TESTOSTERONE CYPIONATE 200 MG/ML IM SOLN: 100.0000 mg | INTRAMUSCULAR | 5 refills | Status: DC

## 2022-08-17 NOTE — Telephone Encounter (Signed)
Lexx looks as if he may be returning your call from 08/15/22 in regards to possible insurance.

## 2022-08-17 NOTE — Telephone Encounter (Signed)
Pt states that he missed a call from the office. No messages. Please advise

## 2022-08-17 NOTE — Telephone Encounter (Signed)
Called patient back about a different matter

## 2022-08-17 NOTE — Telephone Encounter (Signed)
Patient pharmacy [facility] is requesting a 90-da supply for Wellbutrin prescription. Please advise?

## 2022-08-17 NOTE — Telephone Encounter (Signed)
Okay I will try to call him again

## 2022-08-18 LAB — TESTOSTERONE, FREE, TOTAL, SHBG
Sex Hormone Binding: 30 nmol/L (ref 19.3–76.4)
Testosterone, Free: 8.5 pg/mL (ref 6.6–18.1)
Testosterone: 500 ng/dL (ref 264–916)

## 2022-08-19 MED ORDER — BUPROPION HCL ER (SR) 200 MG PO TB12: 200.0000 mg | ORAL_TABLET | Freq: Two times a day (BID) | ORAL | 0 refills | Status: DC

## 2022-09-28 ENCOUNTER — Other Ambulatory Visit (INDEPENDENT_AMBULATORY_CARE_PROVIDER_SITE_OTHER): Payer: Self-pay | Admitting: Nurse Practitioner

## 2022-09-28 DIAGNOSIS — I83813 Varicose veins of bilateral lower extremities with pain: Secondary | ICD-10-CM

## 2022-10-04 ENCOUNTER — Ambulatory Visit (INDEPENDENT_AMBULATORY_CARE_PROVIDER_SITE_OTHER): Payer: PPO | Admitting: Nurse Practitioner

## 2022-10-04 ENCOUNTER — Ambulatory Visit (INDEPENDENT_AMBULATORY_CARE_PROVIDER_SITE_OTHER): Payer: PPO

## 2022-10-04 ENCOUNTER — Encounter (INDEPENDENT_AMBULATORY_CARE_PROVIDER_SITE_OTHER): Payer: Self-pay | Admitting: Nurse Practitioner

## 2022-10-04 VITALS — BP 121/74 | HR 58 | Resp 14 | Ht 67.0 in | Wt 249.0 lb

## 2022-10-04 DIAGNOSIS — E1129 Type 2 diabetes mellitus with other diabetic kidney complication: Secondary | ICD-10-CM | POA: Diagnosis not present

## 2022-10-04 DIAGNOSIS — R809 Proteinuria, unspecified: Secondary | ICD-10-CM

## 2022-10-04 DIAGNOSIS — I83813 Varicose veins of bilateral lower extremities with pain: Secondary | ICD-10-CM

## 2022-10-04 DIAGNOSIS — M533 Sacrococcygeal disorders, not elsewhere classified: Secondary | ICD-10-CM | POA: Insufficient documentation

## 2022-10-04 NOTE — Progress Notes (Signed)
Subjective:    Patient ID: Andrew [name], male    DOB: May 09, 1956, 67 y.o.   MRN: [id number] Chief Complaint  Patient presents with   Follow-up    ultrasound    [name] is a 67 year old male who presents today as a referral from Dr. Wynetta Emery in regards to varicose veins.  The patient notes that in 2016 he had treatment of his bilateral lower extremities with lasers.  He notes that recently he has been noticing more and worsening varicosities.  He has been experiencing more swelling with the left being worse than the right.  He also notes that his legs feel heavy and tired and fatigued.  He denies any open wounds or ulcerations.  Currently the pain and discomfort is becoming problematic and beginning to affect activities of everyday living.  The patient has worn medical grade compression stockings and this is not drastically improved his symptoms.  Today the patient has no evidence of DVT noted bilaterally.  The patient has evidence of deep venous insufficiency noted bilaterally.  The right lower extremity has reflux in the great saphenous vein from the saphenofemoral junction down to the proximal calf.  It does appear that the patient has had previous ablation of the right small saphenous vein.  The left lower extremity has evidence of laser ablation of the great saphenous vein however it is noted that the patient has reflux of accessory saphenous vein with few superficial varicosities from that out branch.    Review of Systems  Cardiovascular:  Positive for leg swelling.  All other systems reviewed and are negative.      Objective:   Physical Exam Vitals reviewed.  HENT:     Head: Normocephalic.  Cardiovascular:     Rate and Rhythm: Normal rate.  Pulmonary:     Effort: Pulmonary effort is normal.  Skin:    General: Skin is warm and dry.     Comments: Notable prominent varicosities bilaterally with hyperpigmentation   Neurological:     Mental Status: He is alert and oriented  to person, place, and time.  Psychiatric:        Mood and Affect: Mood normal.        Behavior: Behavior normal.        Thought Content: Thought content normal.        Judgment: Judgment normal.    BP 121/74 (BP Location: Left Arm)   Pulse (!) 58   Resp 14   Ht '5\' 7"'$  (1.702 m)   Wt 249 lb (112.9 kg)   BMI 39.00 kg/m   Past Medical History:  Diagnosis Date   Chronic pain syndrome    Depression    Diverticulosis    DVT (deep venous thrombosis) (HCC)    bilateral, was on anticoags for 72month   Ear infection 08/2016   CLEARING UP-FINISHED ANTIBIOTIC AND PREDNISONE TAPER    Family history of adverse reaction to anesthesia    BROTHER-N/V   Hernia, umbilical    History of kidney stones    Hyperlipidemia    Hypertension    IFG (impaired fasting glucose)    Knee pain, chronic    Pes anserine bursitis     Social History   Socioeconomic History   Marital status: Married    Spouse name: Not on file   Number of children: Not on file   Years of education: Not on file   Highest education level: Not on file  Occupational History   Not on file  Tobacco Use   Smoking status: Former    Packs/day: 1.50    Years: 25.00    Total pack years: 37.50    Types: Cigarettes    Quit date: 06/28/2011    Years since quitting: 11.2   Smokeless tobacco: Never  Vaping Use   Vaping Use: Never used  Substance and Sexual Activity   Alcohol use: Yes    Comment: on occasion   Drug use: No   Sexual activity: Yes    Birth control/protection: None  Other Topics Concern   Not on file  Social History Narrative   Not on file   Social Determinants of Health   Financial Resource Strain: Not on file  Food Insecurity: No Food Insecurity (06/12/2022)   Hunger Vital Sign    Worried About Running Out of Food in the Last Year: Never true    Ran Out of Food in the Last Year: Never true  Transportation Needs: No Transportation Needs (06/12/2022)   PRAPARE - Hydrologist  (Medical): No    Lack of Transportation (Non-Medical): No  Physical Activity: Not on file  Stress: Not on file  Social Connections: Not on file  Intimate Partner Violence: Not At Risk (06/12/2022)   Humiliation, Afraid, Rape, and Kick questionnaire    Fear of Current or Ex-Partner: No    Emotionally Abused: No    Physically Abused: No    Sexually Abused: No    Past Surgical History:  Procedure Laterality Date   COLONOSCOPY  2015   JOINT REPLACEMENT Bilateral 2009   replacement and revision   KIDNEY STONE SURGERY     SHOULDER ARTHROSCOPY WITH ROTATOR CUFF REPAIR Right    UMBILICAL HERNIA REPAIR N/A 09/06/2016   Procedure: HERNIA REPAIR UMBILICAL ADULT;  Surgeon: Robert Bellow, MD;  Location: [facility];  Service: General;  Laterality: N/A;    Family History  Problem Relation Age of Onset   Alcohol abuse Father    Hypertension Father    Cirrhosis Father    Hypertension Brother    Stroke Paternal Grandmother    Stroke Paternal Grandfather    Pneumonia Mother     Allergies  Allergen Reactions   Other Other (See Comments)    Adhesive tape gave blisters   Flomax [Tamsulosin] Other (See Comments)    Sexual dysfunction       Latest Ref Rng & Units 05/09/2022    9:39 AM 11/06/2021   10:02 AM 12/26/2020   11:29 AM  CBC  WBC 3.4 - 10.8 x10E3/uL 6.9  7.8  6.8   Hemoglobin 13.0 - 17.7 g/dL 19.0  18.3  17.1   Hematocrit 37.5 - 51.0 % 52.9  51.9  49.0   Platelets 150 - 450 x10E3/uL 158  164  177       CMP     Component Value Date/Time   NA 139 05/09/2022 0939   K 4.9 05/09/2022 0939   CL 101 05/09/2022 0939   CO2 24 05/09/2022 0939   GLUCOSE 151 (H) 05/09/2022 0939   BUN 19 05/09/2022 0939   CREATININE 0.99 05/09/2022 0939   CALCIUM 9.1 05/09/2022 0939   PROT 6.3 05/09/2022 0939   ALBUMIN 4.5 05/09/2022 0939   AST 20 05/09/2022 0939   ALT 33 05/09/2022 0939   ALKPHOS 73 05/09/2022 0939   BILITOT 1.3 (H) 05/09/2022 0939   GFRNONAA 83 05/30/2020 0902   GFRAA 96  05/30/2020 0902     No results found.  Assessment & Plan:   1. Varicose veins of both lower extremities with pain Recommend  I have reviewed my previous  discussion with the patient regarding  varicose veins and why they cause symptoms. Patient will continue  wearing graduated compression stockings class 1 on a daily basis, beginning first thing in the morning and removing them in the evening.  The patient is CEAP [id number].  The patient has been wearing compression for more than 12 weeks with no or little benefit.  The patient has been exercising daily for more than 12 weeks. The patient has been elevating and taking OTC pain medications for more than 12 weeks.  None of these have have eliminated the pain related to the varicose veins and venous reflux or the discomfort regarding venous congestion.    In addition, behavioral modification including elevation during the day was again discussed and this will continue.  The patient has utilized over the counter pain medications and has been exercising.  However, at this time conservative therapy has not alleviated the patient's symptoms of leg pain and swelling  Recommend: laser ablation of the right great saphenous veins to eliminate the symptoms of pain and swelling of the lower extremities caused by the severe superficial venous reflux disease.   2. Controlled type 2 diabetes mellitus with microalbuminuria, without long-term current use of insulin (HCC) Continue hypoglycemic medications as already ordered, these medications have been reviewed and there are no changes at this time.  Hgb A1C to be monitored as already arranged by primary service   Current Outpatient Medications on File Prior to Visit  Medication Sig Dispense Refill   benazepril (LOTENSIN) 40 MG tablet Take 1 tablet (40 mg total) by mouth daily. 90 tablet 1   buPROPion (WELLBUTRIN SR) 200 MG 12 hr tablet Take 1 tablet (200 mg total) by mouth 2 (two) times daily. 180  tablet 0   diclofenac (VOLTAREN) 75 MG EC tablet Take 1 tablet (75 mg total) by mouth 2 (two) times daily. 180 tablet 3   gabapentin (NEURONTIN) 300 MG capsule TAKE 2 CAPSULES(600 MG) BY MOUTH TWICE DAILY 360 capsule 1   hydrochlorothiazide (MICROZIDE) 12.5 MG capsule Take 1 capsule (12.5 mg total) by mouth daily. 90 capsule 1   metFORMIN (GLUCOPHAGE-XR) 500 MG 24 hr tablet TAKE 2 TABLETS(1000 MG) BY MOUTH TWICE DAILY 360 tablet 1   metoprolol succinate (TOPROL-XL) 100 MG 24 hr tablet TAKE 1 TABLET BY MOUTH  DAILY WITH OR IMMEDIATELY  FOLLOWING A MEAL 90 tablet 1   sildenafil (VIAGRA) 100 MG tablet TAKE 1/2 TO 1 TABLET BY MOUTH DAILY AS NEEDED FOR ERECTILE DYSFUNCTION     tadalafil (CIALIS) 5 MG tablet TAKE 1 TABLET(5 MG) BY MOUTH DAILY 90 tablet 1   testosterone cypionate (DEPO-TESTOSTERONE) 200 MG/ML injection Inject 0.5 mLs (100 mg total) into the muscle every 14 (fourteen) days. 1 mL 5   traMADol (ULTRAM) 50 MG tablet 2 tabs in the AM and 1 tab in the PM 90 tablet 2   No current facility-administered medications on file prior to visit.    There are no Patient Instructions on file for this visit. No follow-ups on file.   Kris Hartmann, NP

## 2022-11-13 ENCOUNTER — Encounter: Payer: Self-pay | Admitting: Family Medicine

## 2022-11-13 ENCOUNTER — Ambulatory Visit (INDEPENDENT_AMBULATORY_CARE_PROVIDER_SITE_OTHER): Payer: PPO | Admitting: Family Medicine

## 2022-11-13 VITALS — BP 130/83 | HR 64 | Temp 98.9°F | Ht 67.0 in | Wt 248.6 lb

## 2022-11-13 DIAGNOSIS — N401 Enlarged prostate with lower urinary tract symptoms: Secondary | ICD-10-CM | POA: Diagnosis not present

## 2022-11-13 DIAGNOSIS — Z87891 Personal history of nicotine dependence: Secondary | ICD-10-CM | POA: Diagnosis not present

## 2022-11-13 DIAGNOSIS — E291 Testicular hypofunction: Secondary | ICD-10-CM | POA: Diagnosis not present

## 2022-11-13 DIAGNOSIS — G8929 Other chronic pain: Secondary | ICD-10-CM

## 2022-11-13 DIAGNOSIS — Z136 Encounter for screening for cardiovascular disorders: Secondary | ICD-10-CM

## 2022-11-13 DIAGNOSIS — R809 Proteinuria, unspecified: Secondary | ICD-10-CM

## 2022-11-13 DIAGNOSIS — M545 Low back pain, unspecified: Secondary | ICD-10-CM | POA: Diagnosis not present

## 2022-11-13 DIAGNOSIS — Z0000 Encounter for general adult medical examination without abnormal findings: Secondary | ICD-10-CM | POA: Diagnosis not present

## 2022-11-13 DIAGNOSIS — R3914 Feeling of incomplete bladder emptying: Secondary | ICD-10-CM | POA: Diagnosis not present

## 2022-11-13 DIAGNOSIS — E1129 Type 2 diabetes mellitus with other diabetic kidney complication: Secondary | ICD-10-CM | POA: Diagnosis not present

## 2022-11-13 DIAGNOSIS — I129 Hypertensive chronic kidney disease with stage 1 through stage 4 chronic kidney disease, or unspecified chronic kidney disease: Secondary | ICD-10-CM

## 2022-11-13 NOTE — Progress Notes (Unsigned)
BP 130/83   Pulse 64   Temp 98.9 F (37.2 C) (Oral)   Ht 5\' 7"  (1.702 m)   Wt 248 lb 9.6 oz (112.8 kg)   SpO2 97%   BMI 38.94 kg/m    Subjective:    Patient ID: Andrew [name], male    DOB: 1956/05/09, 67 y.o.   MRN: [id number]  HPI: Andrew [name] is a 67 y.o. male presenting on 11/13/2022 for comprehensive medical examination. Current medical complaints include:  HYPERTENSION / HYPERLIPIDEMIA Satisfied with current treatment? {Blank single:19197::"yes","no"} Duration of hypertension: {Blank single:19197::"chronic","months","years"} BP monitoring frequency: {Blank single:19197::"not checking","rarely","daily","weekly","monthly","a few times a day","a few times a week","a few times a month"} BP range:  BP medication side effects: {Blank single:19197::"yes","no"} Past BP meds: {Blank multiple:19196::"none","amlodipine","amlodipine/benazepril","atenolol","benazepril","benazepril/HCTZ","bisoprolol (bystolic)","carvedilol","chlorthalidone","clonidine","diltiazem","exforge HCT","HCTZ","irbesartan (avapro)","labetalol","lisinopril","lisinopril-HCTZ","losartan (cozaar)","methyldopa","nifedipine","olmesartan (benicar)","olmesartan-HCTZ","quinapril","ramipril","spironalactone","tekturna","valsartan","valsartan-HCTZ","verapamil"} Duration of hyperlipidemia: {Blank single:19197::"chronic","months","years"} Cholesterol medication side effects: {Blank single:19197::"yes","no"} Cholesterol supplements: {Blank multiple:19196::"none","fish oil","niacin","red yeast rice"} Past cholesterol medications: {Blank multiple:19196::"none","atorvastain (lipitor)","lovastatin (mevacor)","pravastatin (pravachol)","rosuvastatin (crestor)","simvastatin (zocor)","vytorin","fenofibrate (tricor)","gemfibrozil","ezetimide (zetia)","niaspan","lovaza"} Medication compliance: {Blank single:19197::"excellent compliance","good compliance","fair compliance","poor compliance"} Aspirin: {Blank single:19197::"yes","no"} Recent  stressors: {Blank single:19197::"yes","no"} Recurrent headaches: {Blank single:19197::"yes","no"} Visual changes: {Blank single:19197::"yes","no"} Palpitations: {Blank single:19197::"yes","no"} Dyspnea: {Blank single:19197::"yes","no"} Chest pain: {Blank single:19197::"yes","no"} Lower extremity edema: {Blank single:19197::"yes","no"} Dizzy/lightheaded: {Blank single:19197::"yes","no"}  DIABETES Hypoglycemic episodes:no Polydipsia/polyuria: no Visual disturbance: no Chest pain: no Paresthesias: no Glucose Monitoring: no  Accucheck frequency: Not Checking Taking Insulin?: no Blood Pressure Monitoring: not checking Retinal Examination: {Blank single:19197::"Up to Date","Not up to Date"} Foot Exam: {Blank single:19197::"Up to Date","Not up to Date"} Diabetic Education: {Blank single:19197::"Completed","Not Completed"} Pneumovax: {Blank single:19197::"Up to Date","Not up to Date","unknown"} Influenza: {Blank single:19197::"Up to Date","Not up to Date","unknown"} Aspirin: {Blank single:19197::"yes","no"}  DEPRESSION Mood status: {Blank single:19197::"controlled","uncontrolled","better","worse","exacerbated","stable"} Satisfied with current treatment?: {Blank single:19197::"yes","no"} Symptom severity: {Blank single:19197::"mild","moderate","severe"}  Duration of current treatment : {Blank single:19197::"chronic","months","years"} Side effects: {Blank single:19197::"yes","no"} Medication compliance: {Blank single:19197::"excellent compliance","good compliance","fair compliance","poor compliance"} Psychotherapy/counseling: {Blank single:19197::"yes","no"} {Blank single:19197::"current","in the past"} Previous psychiatric medications: {Blank multiple:19196::"abilify","amitryptiline","buspar","celexa","cymbalta","depakote","effexor","lamictal","lexapro","lithium","nortryptiline","paxil","prozac","pristiq (desvenlafaxine","seroquel","wellbutrin","zoloft","zyprexa"} Depressed mood: {Blank  single:19197::"yes","no"} Anxious mood: {Blank single:19197::"yes","no"} Anhedonia: {Blank single:19197::"yes","no"} Significant weight loss or gain: {Blank single:19197::"yes","no"} Insomnia: {Blank single:19197::"yes","no"} {Blank single:19197::"hard to fall asleep","hard to stay asleep"} Fatigue: {Blank single:19197::"yes","no"} Feelings of worthlessness or guilt: {Blank single:19197::"yes","no"} Impaired concentration/indecisiveness: {Blank single:19197::"yes","no"} Suicidal ideations: {Blank single:19197::"yes","no"} Hopelessness: {Blank single:19197::"yes","no"} Crying spells: {Blank single:19197::"yes","no"}    11/13/2022    8:07 AM 08/14/2022   10:52 AM 05/09/2022    9:36 AM 02/06/2022    8:32 AM 11/06/2021    9:54 AM  Depression screen PHQ 2/9  Decreased Interest 0 0 0 1 0  Down, Depressed, Hopeless 0 0 0 0 0  PHQ - 2 Score 0 0 0 1 0  Altered sleeping 0 0 1 0 0  Tired, decreased energy 0 0 0 1 0  Change in appetite 0 0 0 1 0  Feeling bad or failure about yourself  0 0 0 0 0  Trouble concentrating 0 0 0 0 0  Moving slowly or fidgety/restless 0 0 0 0 0  Suicidal thoughts 0 0 0 0 0  PHQ-9 Score 0 0 1 3 0  Difficult doing work/chores Not difficult at all Not difficult at all Not difficult at all     LOW TESTOSTERONE Duration: {Blank single:19197::"chronic","months","years"} Status: {Blank single:19197::"controlled","uncontrolled","better","worse","exacerbated","stable"}  Satisfied with current treatment:  {Blank single:19197::"yes","no"} Previous testosterone therapies: Medication side effects:  {Blank single:19197::"yes","no"} Medication compliance: {Blank single:19197::"excellent compliance","good compliance","fair compliance","poor compliance"} Decreased libido: {Blank single:19197::"yes","no"} Fatigue: {Blank single:19197::"yes","no"} Depressed mood: {Blank single:19197::"yes","no"} Muscle weakness: {Blank single:19197::"yes","no"} Erectile dysfunction: {Blank  single:19197::"yes","no"}   He currently lives  with: wife Interim Problems from his last visit: no  Depression Screen done today and results listed below:     11/13/2022    8:07 AM 08/14/2022   10:52 AM 05/09/2022    9:36 AM 02/06/2022    8:32 AM 11/06/2021    9:54 AM  Depression screen PHQ 2/9  Decreased Interest 0 0 0 1 0  Down, Depressed, Hopeless 0 0 0 0 0  PHQ - 2 Score 0 0 0 1 0  Altered sleeping 0 0 1 0 0  Tired, decreased energy 0 0 0 1 0  Change in appetite 0 0 0 1 0  Feeling bad or failure about yourself  0 0 0 0 0  Trouble concentrating 0 0 0 0 0  Moving slowly or fidgety/restless 0 0 0 0 0  Suicidal thoughts 0 0 0 0 0  PHQ-9 Score 0 0 1 3 0  Difficult doing work/chores Not difficult at all Not difficult at all Not difficult at all      Past Medical History:  Past Medical History:  Diagnosis Date   Chronic pain syndrome    Depression    Diverticulosis    DVT (deep venous thrombosis) (HCC)    bilateral, was on anticoags for 3months   Ear infection 08/2016   CLEARING UP-FINISHED ANTIBIOTIC AND PREDNISONE TAPER    Family history of adverse reaction to anesthesia    BROTHER-N/V   Hernia, umbilical    History of kidney stones    Hyperlipidemia    Hypertension    IFG (impaired fasting glucose)    Knee pain, chronic    Pes anserine bursitis     Surgical History:  Past Surgical History:  Procedure Laterality Date   COLONOSCOPY  2015   JOINT REPLACEMENT Bilateral 2009   replacement and revision   KIDNEY STONE SURGERY     SHOULDER ARTHROSCOPY WITH ROTATOR CUFF REPAIR Right    UMBILICAL HERNIA REPAIR N/A 09/06/2016   Procedure: HERNIA REPAIR UMBILICAL ADULT;  Surgeon: Earline Mayotte, MD;  Location: [facility] ORS;  Service: General;  Laterality: N/A;    Medications:  Current Outpatient Medications on File Prior to Visit  Medication Sig   benazepril (LOTENSIN) 40 MG tablet Take 1 tablet (40 mg total) by mouth daily.   buPROPion (WELLBUTRIN SR) 200 MG 12 hr  tablet Take 1 tablet (200 mg total) by mouth 2 (two) times daily.   diclofenac (VOLTAREN) 75 MG EC tablet Take 1 tablet (75 mg total) by mouth 2 (two) times daily.   gabapentin (NEURONTIN) 300 MG capsule TAKE 2 CAPSULES(600 MG) BY MOUTH TWICE DAILY   hydrochlorothiazide (MICROZIDE) 12.5 MG capsule Take 1 capsule (12.5 mg total) by mouth daily.   metFORMIN (GLUCOPHAGE-XR) 500 MG 24 hr tablet TAKE 2 TABLETS(1000 MG) BY MOUTH TWICE DAILY   metoprolol succinate (TOPROL-XL) 100 MG 24 hr tablet TAKE 1 TABLET BY MOUTH  DAILY WITH OR IMMEDIATELY  FOLLOWING A MEAL   tadalafil (CIALIS) 5 MG tablet TAKE 1 TABLET(5 MG) BY MOUTH DAILY   testosterone cypionate (DEPO-TESTOSTERONE) 200 MG/ML injection Inject 0.5 mLs (100 mg total) into the muscle every 14 (fourteen) days.   traMADol (ULTRAM) 50 MG tablet 2 tabs in the AM and 1 tab in the PM   sildenafil (VIAGRA) 100 MG tablet TAKE 1/2 TO 1 TABLET BY MOUTH DAILY AS NEEDED FOR ERECTILE DYSFUNCTION (Patient not taking: Reported on 11/13/2022)   No current facility-administered medications on file prior to visit.    Allergies:  Allergies  Allergen Reactions   Other Other (See Comments)    Adhesive tape gave blisters   Flomax [Tamsulosin] Other (See Comments)    Sexual dysfunction    Social History:  Social History   Socioeconomic History   Marital status: Married    Spouse name: Not on file   Number of children: Not on file   Years of education: Not on file   Highest education level: Not on file  Occupational History   Not on file  Tobacco Use   Smoking status: Former    Packs/day: 1.50    Years: 25.00    Total pack years: 37.50    Types: Cigarettes    Quit date: 06/28/2011    Years since quitting: 11.3   Smokeless tobacco: Never  Vaping Use   Vaping Use: Never used  Substance and Sexual Activity   Alcohol use: Yes    Comment: on occasion   Drug use: No   Sexual activity: Yes    Birth control/protection: None  Other Topics Concern   Not  on file  Social History Narrative   Not on file   Social Determinants of Health   Financial Resource Strain: Not on file  Food Insecurity: No Food Insecurity (06/12/2022)   Hunger Vital Sign    Worried About Running Out of Food in the Last Year: Never true    Ran Out of Food in the Last Year: Never true  Transportation Needs: No Transportation Needs (06/12/2022)   PRAPARE - Administrator, Civil Service (Medical): No    Lack of Transportation (Non-Medical): No  Physical Activity: Not on file  Stress: Not on file  Social Connections: Not on file  Intimate Partner Violence: Not At Risk (06/12/2022)   Humiliation, Afraid, Rape, and Kick questionnaire    Fear of Current or Ex-Partner: No    Emotionally Abused: No    Physically Abused: No    Sexually Abused: No   Social History   Tobacco Use  Smoking Status Former   Packs/day: 1.50   Years: 25.00   Total pack years: 37.50   Types: Cigarettes   Quit date: 06/28/2011   Years since quitting: 11.3  Smokeless Tobacco Never   Social History   Substance and Sexual Activity  Alcohol Use Yes   Comment: on occasion    Family History:  Family History  Problem Relation Age of Onset   Alcohol abuse Father    Hypertension Father    Cirrhosis Father    Hypertension Brother    Stroke Paternal Grandmother    Stroke Paternal Grandfather    Pneumonia Mother     Past medical history, surgical history, medications, allergies, family history and social history reviewed with patient today and changes made to appropriate areas of the chart.   Review of Systems  Constitutional: Negative.   HENT:  Positive for tinnitus (both). Negative for congestion, ear discharge, ear pain, hearing loss, nosebleeds, sinus pain and sore throat.   Eyes: Negative.   Respiratory: Negative.  Negative for stridor.   Cardiovascular: Negative.   Gastrointestinal: Negative.   Genitourinary: Negative.   Musculoskeletal: Negative.   Skin: Negative.    Neurological: Negative.   Endo/Heme/Allergies:  Negative for environmental allergies and polydipsia. Bruises/bleeds easily.  Psychiatric/Behavioral: Negative.     All other ROS negative except what is listed above and in the HPI.      Objective:    BP 130/83   Pulse 64   Temp 98.9 F (  37.2 C) (Oral)   Ht 5\' 7"  (1.702 m)   Wt 248 lb 9.6 oz (112.8 kg)   SpO2 97%   BMI 38.94 kg/m   Wt Readings from Last 3 Encounters:  11/13/22 248 lb 9.6 oz (112.8 kg)  10/04/22 249 lb (112.9 kg)  08/14/22 252 lb 4.8 oz (114.4 kg)    Physical Exam  Results for orders placed or performed in visit on 08/14/22  Bayer DCA Hb A1c Waived  Result Value Ref Range   HB A1C (BAYER DCA - WAIVED) 7.5 (H) 4.8 - 5.6 %  Testosterone, free, total([facility])  Result Value Ref Range   Testosterone 500 264 - 916 ng/dL   Testosterone, Free 8.5 6.6 - 18.1 pg/mL   Sex Hormone Binding 30.0 19.3 - 76.4 nmol/L      Assessment & Plan:   Problem List Items Addressed This Visit   None    Discussed aspirin prophylaxis for myocardial infarction prevention and decision was {Blank single:19197::"it was not indicated","made to continue ASA","made to start ASA","made to stop ASA","that we recommended ASA, and patient refused"}  LABORATORY TESTING:  Health maintenance labs ordered today as discussed above.   The natural history of prostate cancer and ongoing controversy regarding screening and potential treatment outcomes of prostate cancer has been discussed with the patient. The meaning of a false positive PSA and a false negative PSA has been discussed. He indicates understanding of the limitations of this screening test and wishes to proceed with screening PSA testing.   IMMUNIZATIONS:   - Tdap: Tetanus vaccination status reviewed: {tetanus status:315746}. - Influenza: {Blank single:19197::"Up to date","Administered today","Postponed to flu season","Refused","Given elsewhere"} - Pneumovax: {Blank  single:19197::"Up to date","Administered today","Not applicable","Refused","Given elsewhere"} - Prevnar: {Blank single:19197::"Up to date","Administered today","Not applicable","Refused","Given elsewhere"} - COVID: {Blank single:19197::"Up to date","Administered today","Not applicable","Refused","Given elsewhere"} - HPV: {Blank single:19197::"Up to date","Administered today","Not applicable","Refused","Given elsewhere"} - Shingrix vaccine: {Blank single:19197::"Up to date","Administered today","Not applicable","Refused","Given elsewhere"}  SCREENING: - Colonoscopy: {Blank single:19197::"Up to date","Ordered today","Not applicable","Refused","Done elsewhere"}  Discussed with patient purpose of the colonoscopy is to detect colon cancer at curable precancerous or early stages   PATIENT COUNSELING:    Sexuality: Discussed sexually transmitted diseases, partner selection, use of condoms, avoidance of unintended pregnancy  and contraceptive alternatives.   Advised to avoid cigarette smoking.  I discussed with the patient that most people either abstain from alcohol or drink within safe limits (<=14/week and <=4 drinks/occasion for males, <=7/weeks and <= 3 drinks/occasion for females) and that the risk for alcohol disorders and other health effects rises proportionally with the number of drinks per week and how often a drinker exceeds daily limits.  Discussed cessation/primary prevention of drug use and availability of treatment for abuse.   Diet: Encouraged to adjust caloric intake to maintain  or achieve ideal body weight, to reduce intake of dietary saturated fat and total fat, to limit sodium intake by avoiding high sodium foods and not adding table salt, and to maintain adequate dietary potassium and calcium preferably from fresh fruits, vegetables, and low-fat dairy products.    stressed the importance of regular exercise  Injury prevention: Discussed safety belts, safety helmets, smoke  detector, smoking near bedding or upholstery.   Dental health: Discussed importance of regular tooth brushing, flossing, and dental visits.   Follow up plan: NEXT PREVENTATIVE PHYSICAL DUE IN 1 YEAR. No follow-ups on file.

## 2022-11-13 NOTE — Progress Notes (Unsigned)
BP 130/83   Pulse 64   Temp 98.9 F (37.2 C) (Oral)   Ht 5' 7"$  (1.702 m)   Wt 248 lb 9.6 oz (112.8 kg)   SpO2 97%   BMI 38.94 kg/m    Subjective:    Patient ID: [name], male    DOB: Feb 08, 1956, 67 y.o.   MRN: [id number]  HPI: [name] is a 67 y.o. male presenting on 11/13/2022 for comprehensive medical examination. Current medical complaints include:  HYPERTENSION / HYPERLIPIDEMIA Satisfied with current treatment? {Blank single:19197::"yes","no"} Duration of hypertension: {Blank single:19197::"chronic","months","years"} BP monitoring frequency: {Blank single:19197::"not checking","rarely","daily","weekly","monthly","a few times a day","a few times a week","a few times a month"} BP range:  BP medication side effects: {Blank single:19197::"yes","no"} Past BP meds: {Blank A999333 (bystolic)","carvedilol","chlorthalidone","clonidine","diltiazem","exforge HCT","HCTZ","irbesartan (avapro)","labetalol","lisinopril","lisinopril-HCTZ","losartan (cozaar)","methyldopa","nifedipine","olmesartan (benicar)","olmesartan-HCTZ","quinapril","ramipril","spironalactone","tekturna","valsartan","valsartan-HCTZ","verapamil"} Duration of hyperlipidemia: {Blank single:19197::"chronic","months","years"} Cholesterol medication side effects: {Blank single:19197::"yes","no"} Cholesterol supplements: {Blank multiple:19196::"none","fish oil","niacin","red yeast rice"} Past cholesterol medications: {Blank multiple:19196::"none","atorvastain (lipitor)","lovastatin (mevacor)","pravastatin (pravachol)","rosuvastatin (crestor)","simvastatin (zocor)","vytorin","fenofibrate (tricor)","gemfibrozil","ezetimide (zetia)","niaspan","lovaza"} Medication compliance: {Blank single:19197::"excellent compliance","good compliance","fair compliance","poor compliance"} Aspirin: {Blank single:19197::"yes","no"} Recent  stressors: {Blank single:19197::"yes","no"} Recurrent headaches: {Blank single:19197::"yes","no"} Visual changes: {Blank single:19197::"yes","no"} Palpitations: {Blank single:19197::"yes","no"} Dyspnea: {Blank single:19197::"yes","no"} Chest pain: {Blank single:19197::"yes","no"} Lower extremity edema: {Blank single:19197::"yes","no"} Dizzy/lightheaded: {Blank single:19197::"yes","no"}  DIABETES Hypoglycemic episodes:{Blank single:19197::"yes","no"} Polydipsia/polyuria: {Blank single:19197::"yes","no"} Visual disturbance: {Blank single:19197::"yes","no"} Chest pain: {Blank single:19197::"yes","no"} Paresthesias: {Blank single:19197::"yes","no"} Glucose Monitoring: {Blank single:19197::"yes","no"}  Accucheck frequency: {Blank single:19197::"Not Checking","Daily","BID","TID"}  Fasting glucose:  Post prandial:  Evening:  Before meals: Taking Insulin?: {Blank single:19197::"yes","no"}  Long acting insulin:  Short acting insulin: Blood Pressure Monitoring: {Blank single:19197::"not checking","rarely","daily","weekly","monthly","a few times a day","a few times a week","a few times a month"} Retinal Examination: {Blank single:19197::"Up to Date","Not up to Date"} Foot Exam: {Blank single:19197::"Up to Date","Not up to Date"} Diabetic Education: {Blank single:19197::"Completed","Not Completed"} Pneumovax: {Blank single:19197::"Up to Date","Not up to Date","unknown"} Influenza: {Blank single:19197::"Up to Date","Not up to Date","unknown"} Aspirin: {Blank single:19197::"yes","no"}  DEPRESSION Mood status: {Blank single:19197::"controlled","uncontrolled","better","worse","exacerbated","stable"} Satisfied with current treatment?: {Blank single:19197::"yes","no"} Symptom severity: {Blank single:19197::"mild","moderate","severe"}  Duration of current treatment : {Blank single:19197::"chronic","months","years"} Side effects: {Blank single:19197::"yes","no"} Medication compliance: {Blank  single:19197::"excellent compliance","good compliance","fair compliance","poor compliance"} Psychotherapy/counseling: {Blank single:19197::"yes","no"} {Blank single:19197::"current","in the past"} Previous psychiatric medications: {Blank multiple:19196::"abilify","amitryptiline","buspar","celexa","cymbalta","depakote","effexor","lamictal","lexapro","lithium","nortryptiline","paxil","prozac","pristiq (desvenlafaxine","seroquel","wellbutrin","zoloft","zyprexa"} Depressed mood: {Blank single:19197::"yes","no"} Anxious mood: {Blank single:19197::"yes","no"} Anhedonia: {Blank single:19197::"yes","no"} Significant weight loss or gain: {Blank single:19197::"yes","no"} Insomnia: {Blank single:19197::"yes","no"} {Blank single:19197::"hard to fall asleep","hard to stay asleep"} Fatigue: {Blank single:19197::"yes","no"} Feelings of worthlessness or guilt: {Blank single:19197::"yes","no"} Impaired concentration/indecisiveness: {Blank single:19197::"yes","no"} Suicidal ideations: {Blank single:19197::"yes","no"} Hopelessness: {Blank single:19197::"yes","no"} Crying spells: {Blank single:19197::"yes","no"}    11/13/2022    8:07 AM 08/14/2022   10:52 AM 05/09/2022    9:36 AM 02/06/2022    8:32 AM 11/06/2021    9:54 AM  Depression screen PHQ 2/9  Decreased Interest 0 0 0 1 0  Down, Depressed, Hopeless 0 0 0 0 0  PHQ - 2 Score 0 0 0 1 0  Altered sleeping 0 0 1 0 0  Tired, decreased energy 0 0 0 1 0  Change in appetite 0 0 0 1 0  Feeling bad or failure about yourself  0 0 0 0 0  Trouble concentrating 0 0 0 0 0  Moving slowly or fidgety/restless 0 0 0 0 0  Suicidal thoughts 0 0 0 0 0  PHQ-9 Score 0 0 1 3 0  Difficult doing work/chores Not difficult at all Not difficult at all Not difficult at all     LOW TESTOSTERONE Duration: {Blank single:19197::"chronic","months","years"} Status: {Blank single:19197::"controlled","uncontrolled","better","worse","exacerbated","stable"}  Satisfied with current  treatment:  {Blank single:19197::"yes","no"} Previous  testosterone therapies: Medication side effects:  {Blank single:19197::"yes","no"} Medication compliance: {Blank single:19197::"excellent compliance","good compliance","fair compliance","poor compliance"} Decreased libido: {Blank single:19197::"yes","no"} Fatigue: {Blank single:19197::"yes","no"} Depressed mood: {Blank single:19197::"yes","no"} Muscle weakness: {Blank single:19197::"yes","no"} Erectile dysfunction: {Blank single:19197::"yes","no"}   He currently lives with: wife Interim Problems from his last visit: no  Functional Status Survey: Is the patient deaf or have difficulty hearing?: Yes Does the patient have difficulty seeing, even when wearing glasses/contacts?: No Does the patient have difficulty concentrating, remembering, or making decisions?: No Does the patient have difficulty walking or climbing stairs?: Yes Does the patient have difficulty dressing or bathing?: No Does the patient have difficulty doing errands alone such as visiting a doctor's office or shopping?: No  FALL RISK:    11/13/2022    8:08 AM 08/14/2022   10:52 AM 06/30/2021   11:26 AM 05/30/2020    8:21 AM 10/08/2019    2:33 PM  [place] in the past year? 0 0 0 1 0  Number falls in past yr: 0 0 0 0 0  Injury with Fall? 0 0 0 1 0  Risk for fall due to : No Fall Risks No Fall Risks No Fall Risks    Follow up Falls evaluation completed Falls evaluation completed Falls evaluation completed      Depression Screen    11/13/2022    8:07 AM 08/14/2022   10:52 AM 05/09/2022    9:36 AM 02/06/2022    8:32 AM 11/06/2021    9:54 AM  Depression screen PHQ 2/9  Decreased Interest 0 0 0 1 0  Down, Depressed, Hopeless 0 0 0 0 0  PHQ - 2 Score 0 0 0 1 0  Altered sleeping 0 0 1 0 0  Tired, decreased energy 0 0 0 1 0  Change in appetite 0 0 0 1 0  Feeling bad or failure about yourself  0 0 0 0 0  Trouble concentrating 0 0 0 0 0  Moving slowly or  fidgety/restless 0 0 0 0 0  Suicidal thoughts 0 0 0 0 0  PHQ-9 Score 0 0 1 3 0  Difficult doing work/chores Not difficult at all Not difficult at all Not difficult at all      Advanced Directives Does patient have a HCPOA?    no Does patient have a living will or MOST form?  no  Past Medical History:  Past Medical History:  Diagnosis Date   Chronic pain syndrome    Depression    Diverticulosis    DVT (deep venous thrombosis) ([place])    bilateral, was on anticoags for 53month   Ear infection 08/2016   CLEARING UP-FINISHED ANTIBIOTIC AND PREDNISONE TAPER    Family history of adverse reaction to anesthesia    BROTHER-N/V   Hernia, umbilical    History of kidney stones    Hyperlipidemia    Hypertension    IFG (impaired fasting glucose)    Knee pain, chronic    Pes anserine bursitis     Surgical History:  Past Surgical History:  Procedure Laterality Date   COLONOSCOPY  2015   JOINT REPLACEMENT Bilateral 2009   replacement and revision   KIDNEY STONE SURGERY     SHOULDER ARTHROSCOPY WITH ROTATOR CUFF REPAIR Right    UMBILICAL HERNIA REPAIR N/A 09/06/2016   Procedure: HERNIA REPAIR UMBILICAL ADULT;  Surgeon: BRobert Bellow MD;  Location: [facility] ORS;  Service: General;  Laterality: N/A;    Medications:  Current Outpatient Medications on File  Prior to Visit  Medication Sig   benazepril (LOTENSIN) 40 MG tablet Take 1 tablet (40 mg total) by mouth daily.   buPROPion (WELLBUTRIN SR) 200 MG 12 hr tablet Take 1 tablet (200 mg total) by mouth 2 (two) times daily.   diclofenac (VOLTAREN) 75 MG EC tablet Take 1 tablet (75 mg total) by mouth 2 (two) times daily.   gabapentin (NEURONTIN) 300 MG capsule TAKE 2 CAPSULES(600 MG) BY MOUTH TWICE DAILY   hydrochlorothiazide (MICROZIDE) 12.5 MG capsule Take 1 capsule (12.5 mg total) by mouth daily.   metFORMIN (GLUCOPHAGE-XR) 500 MG 24 hr tablet TAKE 2 TABLETS(1000 MG) BY MOUTH TWICE DAILY   metoprolol succinate (TOPROL-XL) 100 MG 24 hr  tablet TAKE 1 TABLET BY MOUTH  DAILY WITH OR IMMEDIATELY  FOLLOWING A MEAL   tadalafil (CIALIS) 5 MG tablet TAKE 1 TABLET(5 MG) BY MOUTH DAILY   testosterone cypionate (DEPO-TESTOSTERONE) 200 MG/ML injection Inject 0.5 mLs (100 mg total) into the muscle every 14 (fourteen) days.   traMADol (ULTRAM) 50 MG tablet 2 tabs in the AM and 1 tab in the PM   sildenafil (VIAGRA) 100 MG tablet TAKE 1/2 TO 1 TABLET BY MOUTH DAILY AS NEEDED FOR ERECTILE DYSFUNCTION (Patient not taking: Reported on 11/13/2022)   No current facility-administered medications on file prior to visit.    Allergies:  Allergies  Allergen Reactions   Other Other (See Comments)    Adhesive tape gave blisters   Flomax [Tamsulosin] Other (See Comments)    Sexual dysfunction    Social History:  Social History   Socioeconomic History   Marital status: Married    Spouse name: Not on file   Number of children: Not on file   Years of education: Not on file   Highest education level: Not on file  Occupational History   Not on file  Tobacco Use   Smoking status: Former    Packs/day: 1.50    Years: 25.00    Total pack years: 37.50    Types: Cigarettes    Quit date: 06/28/2011    Years since quitting: 11.3   Smokeless tobacco: Never  Vaping Use   Vaping Use: Never used  Substance and Sexual Activity   Alcohol use: Yes    Comment: on occasion   Drug use: No   Sexual activity: Yes    Birth control/protection: None  Other Topics Concern   Not on file  Social History Narrative   Not on file   Social Determinants of Health   Financial Resource Strain: Not on file  Food Insecurity: No Food Insecurity (06/12/2022)   Hunger Vital Sign    Worried About Running Out of Food in the Last Year: Never true    Ran Out of Food in the Last Year: Never true  Transportation Needs: No Transportation Needs (06/12/2022)   PRAPARE - Hydrologist (Medical): No    Lack of Transportation (Non-Medical): No   Physical Activity: Not on file  Stress: Not on file  Social Connections: Not on file  Intimate Partner Violence: Not At Risk (06/12/2022)   Humiliation, Afraid, Rape, and Kick questionnaire    Fear of Current or Ex-Partner: No    Emotionally Abused: No    Physically Abused: No    Sexually Abused: No   Social History   Tobacco Use  Smoking Status Former   Packs/day: 1.50   Years: 25.00   Total pack years: 37.50   Types: Cigarettes  Quit date: 06/28/2011   Years since quitting: 11.3  Smokeless Tobacco Never   Social History   Substance and Sexual Activity  Alcohol Use Yes   Comment: on occasion    Family History:  Family History  Problem Relation Age of Onset   Alcohol abuse Father    Hypertension Father    Cirrhosis Father    Hypertension Brother    Stroke Paternal Grandmother    Stroke Paternal Grandfather    Pneumonia Mother     Past medical history, surgical history, medications, allergies, family history and social history reviewed with patient today and changes made to appropriate areas of the chart.   Review of Systems  Constitutional: Negative.   HENT:  Positive for tinnitus (both). Negative for congestion, ear discharge, ear pain, hearing loss, nosebleeds, sinus pain and sore throat.   Eyes: Negative.   Respiratory: Negative.  Negative for stridor.   Cardiovascular: Negative.   Gastrointestinal: Negative.   Genitourinary: Negative.   Musculoskeletal: Negative.   Skin: Negative.   Neurological: Negative.   Endo/Heme/Allergies:  Negative for environmental allergies and polydipsia. Bruises/bleeds easily.  Psychiatric/Behavioral: Negative.     All other ROS negative except what is listed above and in the HPI.      Objective:    BP 130/83   Pulse 64   Temp 98.9 F (37.2 C) (Oral)   Ht 5' 7"$  (1.702 m)   Wt 248 lb 9.6 oz (112.8 kg)   SpO2 97%   BMI 38.94 kg/m   Wt Readings from Last 3 Encounters:  11/13/22 248 lb 9.6 oz (112.8 kg)  10/04/22  249 lb (112.9 kg)  08/14/22 252 lb 4.8 oz (114.4 kg)    Vision Screening   Right eye Left eye Both eyes  Without correction     With correction 20/25 20/15 20/15 $    Physical Exam     11/13/2022    8:51 AM  6CIT Screen  What Year? 0 points  What month? 0 points  What time? 0 points  Count back from 20 0 points  Months in reverse 0 points  Repeat phrase 2 points  Total Score 2 points    Results for orders placed or performed in visit on 08/14/22  Bayer DCA Hb A1c Waived  Result Value Ref Range   HB A1C (BAYER DCA - WAIVED) 7.5 (H) 4.8 - 5.6 %  Testosterone, free, total([facility])  Result Value Ref Range   Testosterone 500 264 - 916 ng/dL   Testosterone, Free 8.5 6.6 - 18.1 pg/mL   Sex Hormone Binding 30.0 19.3 - 76.4 nmol/L      Assessment & Plan:   Problem List Items Addressed This Visit       Endocrine   Hypogonadism in male   Relevant Orders   Comprehensive metabolic panel   CBC with Differential/Platelet   PSA   Testosterone, free, total([facility])   Controlled type 2 diabetes mellitus with microalbuminuria ([place]) - Primary   Relevant Orders   Comprehensive metabolic panel   CBC with Differential/Platelet   Lipid Panel w/o Chol/HDL Ratio   Urinalysis, Routine w reflex microscopic   Hgb A1c w/o eAG   Urine Microalbumin w/creat. ratio     Genitourinary   Benign hypertensive renal disease   Relevant Orders   Comprehensive metabolic panel   CBC with Differential/Platelet   TSH   Urine Microalbumin w/creat. ratio   Benign prostatic hyperplasia with incomplete bladder emptying   Relevant Orders   PSA   Other  Visit Diagnoses     Screening for cardiovascular condition       Relevant Orders   EKG 12-Lead (Completed)        Preventative Services:  AAA Screening: Ordered today Health Risk Assessment and Personalized Prevention Plan: Done today Bone Mass Measurements: N/A CVD Screening: Done today Colon Cancer Screening: Up to  date Depression Screening: Done today Diabetes Screening: Done today Glaucoma Screening: See your eye doctor Hepatitis B vaccine: N/A Hepatitis C screening: Up to date HIV Screening: Up to date Flu Vaccine: Up to date Lung cancer Screening: Ordered- encouraged to schedule Obesity Screening: Done today Pneumonia Vaccines: Get next visit STI Screening: N/A PSA screening: Done today  Discussed aspirin prophylaxis for myocardial infarction prevention and decision was it was not indicated  LABORATORY TESTING:  Health maintenance labs ordered today as discussed above.   The natural history of prostate cancer and ongoing controversy regarding screening and potential treatment outcomes of prostate cancer has been discussed with the patient. The meaning of a false positive PSA and a false negative PSA has been discussed. He indicates understanding of the limitations of this screening test and wishes to proceed with screening PSA testing.   IMMUNIZATIONS:   - Tdap: Tetanus vaccination status reviewed: last tetanus booster within 10 years. - Influenza: Up to date - Pneumovax: Up to date - Prevnar: Not applicable - Zostavax vaccine: Given elsewhere  SCREENING: - Colonoscopy: Up to date  Discussed with patient purpose of the colonoscopy is to detect colon cancer at curable precancerous or early stages   - AAA Screening: Up to date  -Hearing Test: Ordered today   PATIENT COUNSELING:    Sexuality: Discussed sexually transmitted diseases, partner selection, use of condoms, avoidance of unintended pregnancy  and contraceptive alternatives.   Advised to avoid cigarette smoking.  I discussed with the patient that most people either abstain from alcohol or drink within safe limits (<=14/week and <=4 drinks/occasion for males, <=7/weeks and <= 3 drinks/occasion for females) and that the risk for alcohol disorders and other health effects rises proportionally with the number of drinks per week  and how often a drinker exceeds daily limits.  Discussed cessation/primary prevention of drug use and availability of treatment for abuse.   Diet: Encouraged to adjust caloric intake to maintain  or achieve ideal body weight, to reduce intake of dietary saturated fat and total fat, to limit sodium intake by avoiding high sodium foods and not adding table salt, and to maintain adequate dietary potassium and calcium preferably from fresh fruits, vegetables, and low-fat dairy products.    stressed the importance of regular exercise  Injury prevention: Discussed safety belts, safety helmets, smoke detector, smoking near bedding or upholstery.   Dental health: Discussed importance of regular tooth brushing, flossing, and dental visits.   Follow up plan: NEXT PREVENTATIVE PHYSICAL DUE IN 1 YEAR. No follow-ups on file.

## 2022-11-13 NOTE — Patient Instructions (Signed)
Preventative Services:  AAA Screening: Ordered today Health Risk Assessment and Personalized Prevention Plan: Done today Bone Mass Measurements: N/A CVD Screening: Done today Colon Cancer Screening: Up to date Depression Screening: Done today Diabetes Screening: Done today Glaucoma Screening: See your eye doctor Hepatitis B vaccine: N/A Hepatitis C screening: Up to date HIV Screening: Up to date Flu Vaccine: Up to date Lung cancer Screening: Ordered- encouraged to schedule Obesity Screening: Done today Pneumonia Vaccines: Get next visit STI Screening: N/A PSA screening: Done today

## 2022-11-14 LAB — MICROSCOPIC EXAMINATION
Bacteria, UA: NONE SEEN
Casts: NONE SEEN /lpf
RBC, Urine: NONE SEEN /hpf (ref 0–2)

## 2022-11-14 LAB — SPECIMEN STATUS REPORT

## 2022-11-14 LAB — URINALYSIS, ROUTINE W REFLEX MICROSCOPIC
Leukocytes,UA: NEGATIVE
Nitrite, UA: NEGATIVE
RBC, UA: NEGATIVE
Specific Gravity, UA: 1.026 (ref 1.005–1.030)
Urobilinogen, Ur: 0.2 mg/dL (ref 0.2–1.0)
pH, UA: 5 (ref 5.0–7.5)

## 2022-11-14 LAB — MICROALBUMIN / CREATININE URINE RATIO
Creatinine, Urine: 303.4 mg/dL
Microalb/Creat Ratio: 165 mg/g creat — ABNORMAL HIGH (ref 0–29)
Microalbumin, Urine: 502 ug/mL

## 2022-11-15 ENCOUNTER — Encounter: Payer: Self-pay | Admitting: Family Medicine

## 2022-11-15 ENCOUNTER — Other Ambulatory Visit: Payer: Self-pay

## 2022-11-15 ENCOUNTER — Other Ambulatory Visit: Payer: Self-pay | Admitting: Family Medicine

## 2022-11-15 DIAGNOSIS — M533 Sacrococcygeal disorders, not elsewhere classified: Secondary | ICD-10-CM | POA: Diagnosis not present

## 2022-11-15 MED ORDER — TRAMADOL HCL 50 MG PO TABS: ORAL_TABLET | ORAL | 2 refills | Status: DC

## 2022-11-15 MED ORDER — BUPROPION HCL ER (SR) 200 MG PO TB12: 200.0000 mg | ORAL_TABLET | Freq: Two times a day (BID) | ORAL | 0 refills | Status: DC

## 2022-11-15 MED ORDER — METOPROLOL SUCCINATE ER 100 MG PO TB24: ORAL_TABLET | ORAL | 1 refills | Status: DC

## 2022-11-15 MED ORDER — METFORMIN HCL ER 500 MG PO TB24: ORAL_TABLET | ORAL | 1 refills | Status: DC

## 2022-11-15 MED ORDER — TESTOSTERONE CYPIONATE 100 MG/ML IM SOLN: 75.0000 mg | INTRAMUSCULAR | 5 refills | Status: DC

## 2022-11-15 MED ORDER — DICLOFENAC SODIUM 75 MG PO TBEC: 75.0000 mg | DELAYED_RELEASE_TABLET | Freq: Two times a day (BID) | ORAL | 3 refills | Status: DC

## 2022-11-15 MED ORDER — BENAZEPRIL HCL 40 MG PO TABS: 40.0000 mg | ORAL_TABLET | Freq: Every day | ORAL | 1 refills | Status: DC

## 2022-11-15 MED ORDER — GABAPENTIN 300 MG PO CAPS: ORAL_CAPSULE | ORAL | 1 refills | Status: DC

## 2022-11-15 MED ORDER — HYDROCHLOROTHIAZIDE 12.5 MG PO CAPS: 12.5000 mg | ORAL_CAPSULE | Freq: Every day | ORAL | 1 refills | Status: DC

## 2022-11-15 NOTE — Assessment & Plan Note (Signed)
Rechecking labs today. Await results. Treat as needed.  °

## 2022-11-15 NOTE — Assessment & Plan Note (Signed)
Rechecking labs today. Await results. Treat as needed. Refills given.

## 2022-11-15 NOTE — Assessment & Plan Note (Signed)
Under good control on current regimen. Continue current regimen. Continue to monitor. Call with any concerns. Refills given. Labs drawn today.   

## 2022-11-15 NOTE — Assessment & Plan Note (Signed)
Under good control on current regimen. Continue current regimen. Continue to monitor. Call with any concerns. Refills given for 3 months. Follow up 3 months.    

## 2022-11-19 ENCOUNTER — Encounter: Payer: Self-pay | Admitting: Family Medicine

## 2022-11-21 LAB — CBC WITH DIFFERENTIAL/PLATELET
Basophils Absolute: 0.1 10*3/uL (ref 0.0–0.2)
Basos: 1 %
EOS (ABSOLUTE): 0.3 10*3/uL (ref 0.0–0.4)
Eos: 3 %
Hematocrit: 54.1 % — ABNORMAL HIGH (ref 37.5–51.0)
Hemoglobin: 18.3 g/dL — ABNORMAL HIGH (ref 13.0–17.7)
Immature Grans (Abs): 0 10*3/uL (ref 0.0–0.1)
Immature Granulocytes: 1 %
Lymphocytes Absolute: 1.7 10*3/uL (ref 0.7–3.1)
Lymphs: 20 %
MCH: 31.9 pg (ref 26.6–33.0)
MCHC: 33.8 g/dL (ref 31.5–35.7)
MCV: 94 fL (ref 79–97)
Monocytes Absolute: 0.7 10*3/uL (ref 0.1–0.9)
Monocytes: 9 %
Neutrophils Absolute: 5.5 10*3/uL (ref 1.4–7.0)
Neutrophils: 66 %
Platelets: 155 10*3/uL (ref 150–450)
RBC: 5.74 x10E6/uL (ref 4.14–5.80)
RDW: 13.7 % (ref 11.6–15.4)
WBC: 8.3 10*3/uL (ref 3.4–10.8)

## 2022-11-21 LAB — COMPREHENSIVE METABOLIC PANEL
ALT: 43 IU/L (ref 0–44)
AST: 27 IU/L (ref 0–40)
Albumin/Globulin Ratio: 2.8 — ABNORMAL HIGH (ref 1.2–2.2)
Albumin: 4.7 g/dL (ref 3.9–4.9)
Alkaline Phosphatase: 99 IU/L (ref 44–121)
BUN/Creatinine Ratio: 16 (ref 10–24)
BUN: 17 mg/dL (ref 8–27)
Bilirubin Total: 0.9 mg/dL (ref 0.0–1.2)
CO2: 22 mmol/L (ref 20–29)
Calcium: 9.5 mg/dL (ref 8.6–10.2)
Chloride: 98 mmol/L (ref 96–106)
Creatinine, Ser: 1.07 mg/dL (ref 0.76–1.27)
Globulin, Total: 1.7 g/dL (ref 1.5–4.5)
Glucose: 250 mg/dL — ABNORMAL HIGH (ref 70–99)
Potassium: 4.8 mmol/L (ref 3.5–5.2)
Sodium: 138 mmol/L (ref 134–144)
Total Protein: 6.4 g/dL (ref 6.0–8.5)
eGFR: 77 mL/min/{1.73_m2} (ref >59)

## 2022-11-21 LAB — LIPID PANEL W/O CHOL/HDL RATIO
Cholesterol, Total: 193 mg/dL (ref 100–199)
HDL: 31 mg/dL — ABNORMAL LOW (ref >39)
LDL Chol Calc (NIH): 128 mg/dL — ABNORMAL HIGH (ref 0–99)
Triglycerides: 191 mg/dL — ABNORMAL HIGH (ref 0–149)
VLDL Cholesterol Cal: 34 mg/dL (ref 5–40)

## 2022-11-21 LAB — TESTOSTERONE, FREE, TOTAL, SHBG
Sex Hormone Binding: 25.5 nmol/L (ref 19.3–76.4)
Testosterone, Free: 21.8 pg/mL — ABNORMAL HIGH (ref 6.6–18.1)
Testosterone: 1002 ng/dL — ABNORMAL HIGH (ref 264–916)

## 2022-11-21 LAB — TSH: TSH: 4.21 u[IU]/mL (ref 0.450–4.500)

## 2022-11-21 LAB — HGB A1C W/O EAG: Hgb A1c MFr Bld: 8 % — ABNORMAL HIGH (ref 4.8–5.6)

## 2022-11-21 LAB — PSA: Prostate Specific Ag, Serum: 1.1 ng/mL (ref 0.0–4.0)

## 2022-11-22 IMAGING — MR MR LUMBAR SPINE W/O CM
5 series · 30 of 48 positions shown · non-contrast
Comparison: Lumbar spine radiographs 09/18/2019

CLINICAL DATA: Low back pain. Bilateral hip pain.

EXAM:
MRI LUMBAR SPINE WITHOUT CONTRAST
TECHNIQUE: Multiplanar, multisequence MR imaging of the lumbar spine was
performed. No intravenous contrast was administered.

[Series 5: T2 · sagittal · 4.0mm · 0.81mm/px · 6 of 17 slices shown (1 of 2)]
[im 1/17]
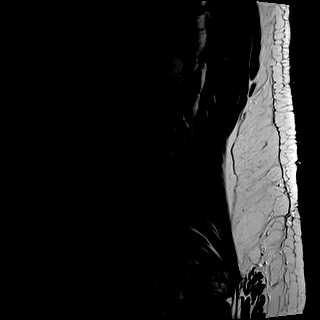
[im 4/17]
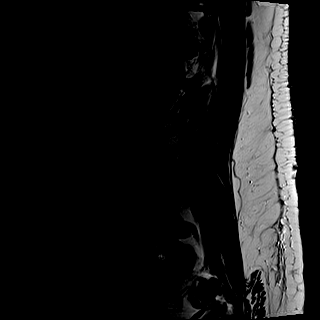
[im 7/17]
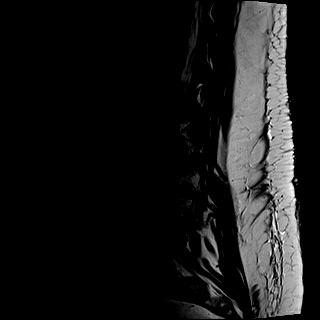
[im 10/17]
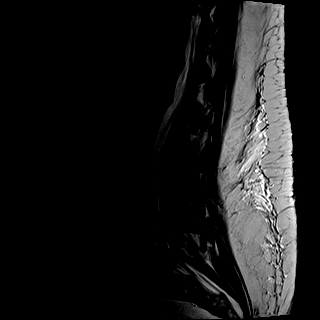
[im 13/17]
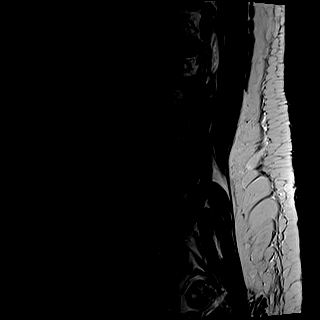
[im 17/17]
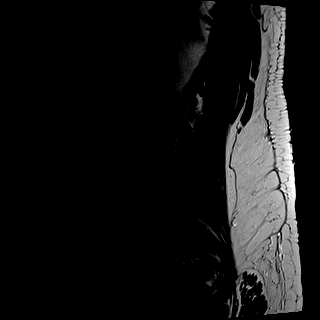

[Series 6: T1 · sagittal · 4.0mm · 0.81mm/px · 7 of 17 slices shown (1 of 2)]
[im 1/17]
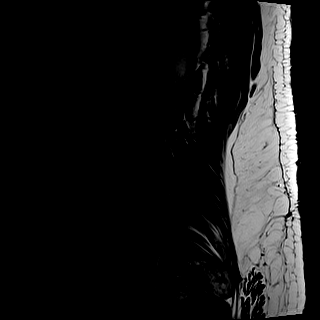
[im 3/17]
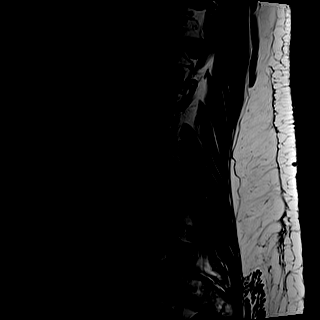
[im 6/17]
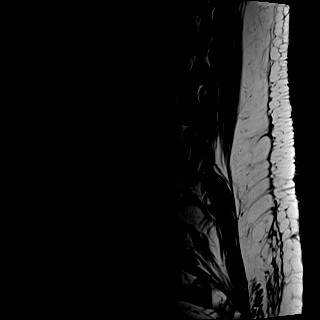
[im 9/17]
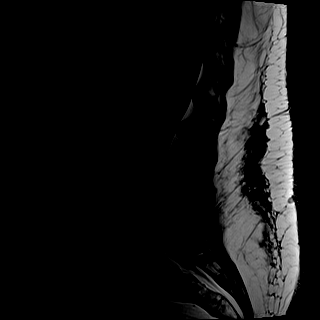
[im 11/17]
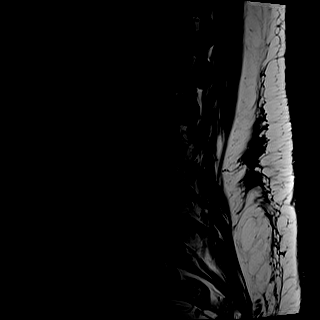
[im 14/17]
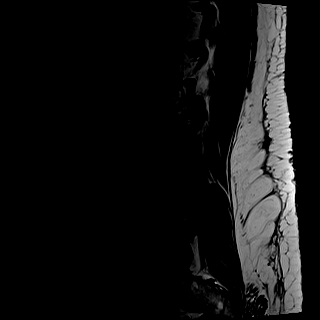
[im 17/17]
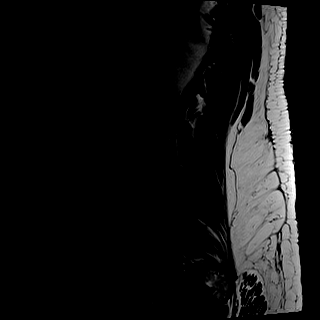

[Series 7: STIR · sagittal · 4.0mm · 0.41mm/px · 1 of 17 slices shown]
[im 1/17]
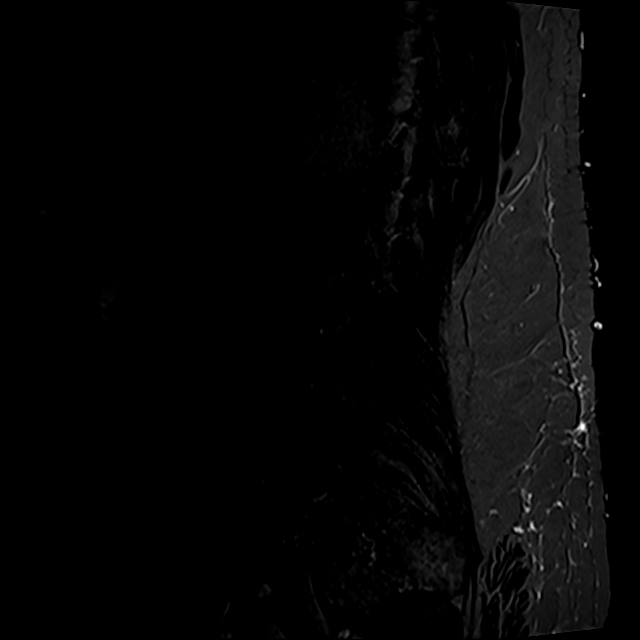

[Series 8: T2 · axial · 4.0mm · 0.78mm/px · z∈[-136,+83]mm · 8 of 36 slices shown (2 of 2)]
[im 1/36]
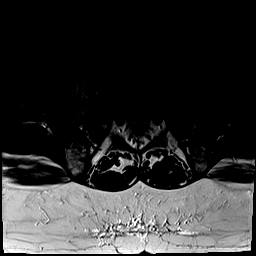
[im 6/36]
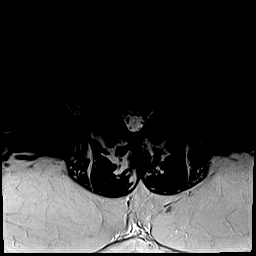
[im 11/36]
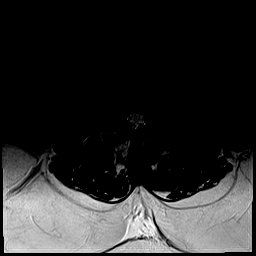
[im 17/36]
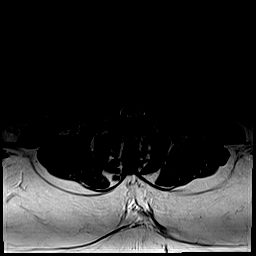
[im 19/36]
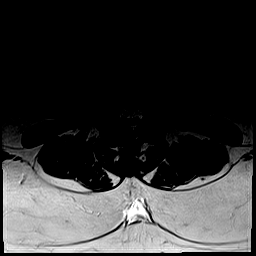
[im 25/36]
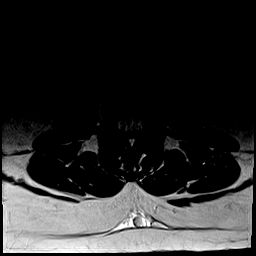
[im 30/36]
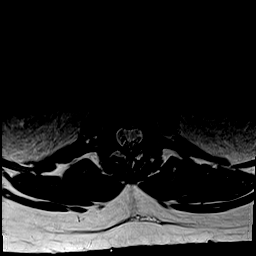
[im 36/36]
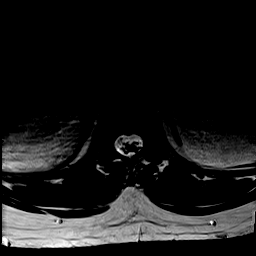

[Series 9: T1 · axial · 4.0mm · 0.39mm/px · z∈[-136,+83]mm · 8 of 36 slices shown (2 of 2)]
[im 1/36]
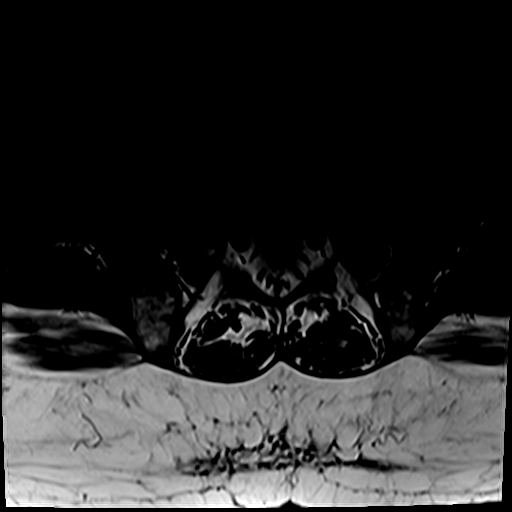
[im 6/36]
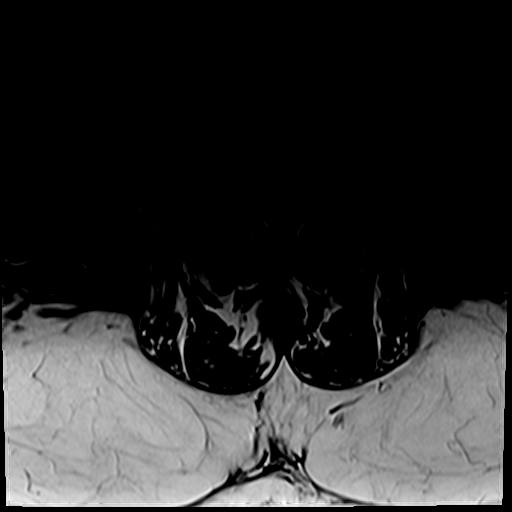
[im 11/36]
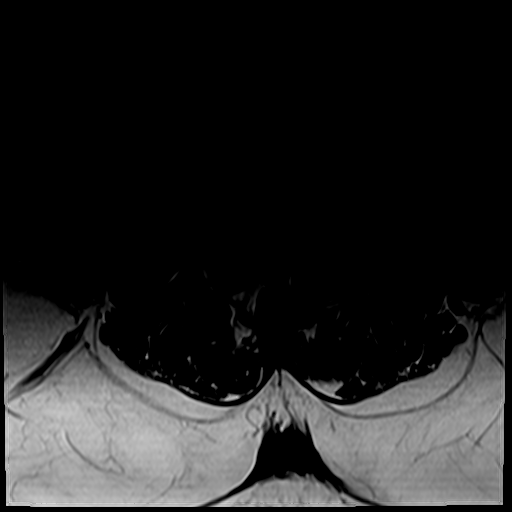
[im 17/36]
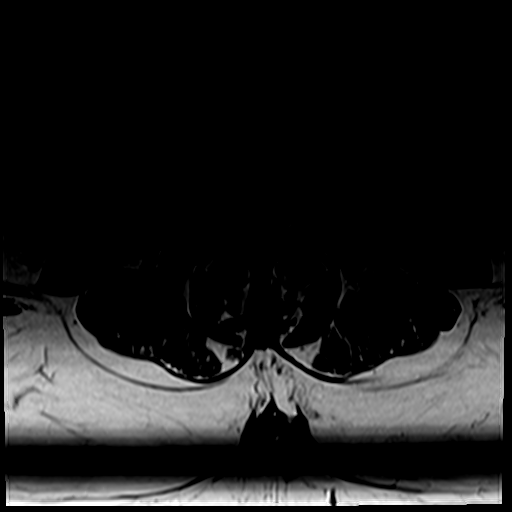
[im 19/36]
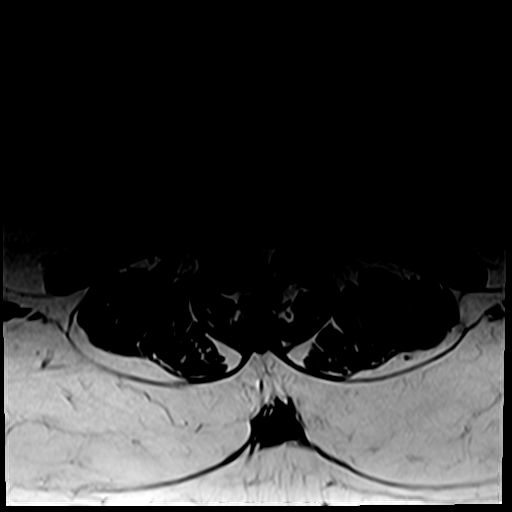
[im 25/36]
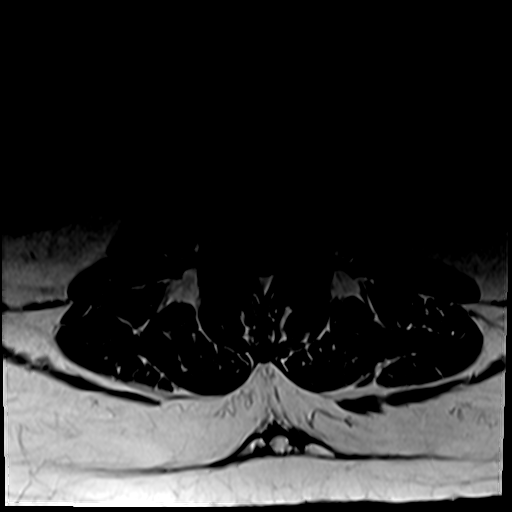
[im 30/36]
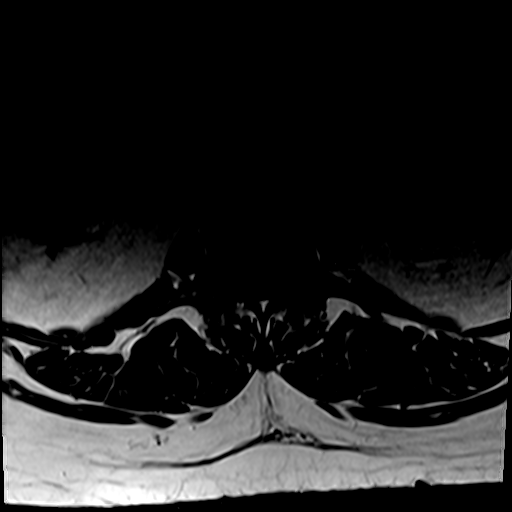
[im 36/36]
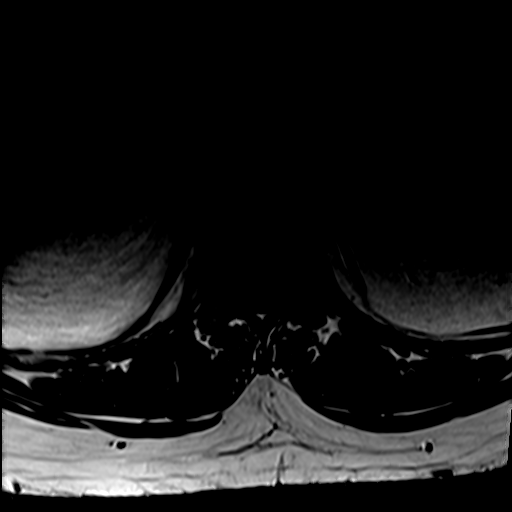

[30 of 48 positions shown; findings below may reference images not displayed]

FINDINGS: Segmentation: 5 non rib-bearing lumbar type vertebral bodies are
present. The lowest fully formed vertebral body is L5.

Alignment: Slight anterolisthesis scratched at slight retrolisthesis
is present at L1-2. No other significant listhesis is present. Mild
leftward curvature is centered at L3-4.

Vertebrae: Small hemangiomas are present at T12, L2, and L5. There
is scattered scratched at there is scattered fatty infiltration. No
other discrete lesions are present. Marrow signal and vertebral body
heights are otherwise normal.

Conus medullaris and cauda equina: Conus extends to the L1 level.
Conus and cauda equina appear normal.

Paraspinal and other soft tissues: 2.2 cm cyst is present
posteriorly in the right kidney. No other focal lesions are present.
Visualized abdomen is otherwise unremarkable.

Disc levels:

T12-L1: Mild broad-based disc bulge is present. No significant
stenosis is present.

L1-2: Mild disc bulging and facet hypertrophy is present. Mild
bilateral foraminal narrowing is worse on the left.

L2-3: Negative.

L3-4: Disc bulges into the foramina bilaterally with mild bilateral
foraminal narrowing. Mild facet hypertrophy contributes.

L4-5: A broad-based disc protrusion is asymmetric to the left.
Asymmetric left-sided facet spurring is present as well. This leads
to moderate foraminal narrowing bilaterally, worse on the left. Mild
left subarticular narrowing is present as well.

L5-S1: Moderate facet hypertrophy is present. No significant focal
disc protrusion or stenosis is present.
IMPRESSION: 1. Multilevel spondylosis of the lumbar spine as described.
2. Mild bilateral foraminal narrowing at L1-2 and L3-4.
3. Moderate foraminal stenosis bilaterally at L4-5 is worse on the
left, secondary to a leftward disc protrusion and bilateral facet
hypertrophy.
4. Mild left subarticular narrowing at L4-5.
5. Moderate facet hypertrophy at L5-S1 without significant stenosis.

## 2022-11-26 ENCOUNTER — Encounter: Payer: PPO | Admitting: Family Medicine

## 2022-11-28 ENCOUNTER — Ambulatory Visit: Payer: PPO

## 2022-11-29 ENCOUNTER — Other Ambulatory Visit: Payer: Self-pay

## 2022-11-29 ENCOUNTER — Emergency Department: Payer: PPO

## 2022-11-29 ENCOUNTER — Emergency Department
Admission: EM | Admit: 2022-11-29 | Discharge: 2022-11-29 | Disposition: A | Discharge status: Home / Self Care | Payer: PPO | Attending: Emergency Medicine | Admitting: Emergency Medicine

## 2022-11-29 DIAGNOSIS — E119 Type 2 diabetes mellitus without complications: Secondary | ICD-10-CM | POA: Insufficient documentation

## 2022-11-29 DIAGNOSIS — H538 Other visual disturbances: Secondary | ICD-10-CM

## 2022-11-29 DIAGNOSIS — R29818 Other symptoms and signs involving the nervous system: Secondary | ICD-10-CM | POA: Diagnosis not present

## 2022-11-29 DIAGNOSIS — I10 Essential (primary) hypertension: Secondary | ICD-10-CM | POA: Diagnosis not present

## 2022-11-29 DIAGNOSIS — I160 Hypertensive urgency: Secondary | ICD-10-CM | POA: Diagnosis not present

## 2022-11-29 DIAGNOSIS — R42 Dizziness and giddiness: Secondary | ICD-10-CM | POA: Diagnosis not present

## 2022-11-29 LAB — CBC
HCT: 53.8 % — ABNORMAL HIGH (ref 39.0–52.0)
Hemoglobin: 18.3 g/dL — ABNORMAL HIGH (ref 13.0–17.0)
MCH: 31.4 pg (ref 26.0–34.0)
MCHC: 34 g/dL (ref 30.0–36.0)
MCV: 92.3 fL (ref 80.0–100.0)
Platelets: 148 10*3/uL — ABNORMAL LOW (ref 150–400)
RBC: 5.83 MIL/uL — ABNORMAL HIGH (ref 4.22–5.81)
RDW: 14.2 % (ref 11.5–15.5)
WBC: 7.9 10*3/uL (ref 4.0–10.5)
nRBC: 0 % (ref 0.0–0.2)

## 2022-11-29 LAB — COMPREHENSIVE METABOLIC PANEL
ALT: 34 U/L (ref 0–44)
AST: 27 U/L (ref 15–41)
Albumin: 4.3 g/dL (ref 3.5–5.0)
Alkaline Phosphatase: 69 U/L (ref 38–126)
Anion gap: 9 (ref 5–15)
BUN: 21 mg/dL (ref 8–23)
CO2: 26 mmol/L (ref 22–32)
Calcium: 9 mg/dL (ref 8.9–10.3)
Chloride: 100 mmol/L (ref 98–111)
Creatinine, Ser: 1.04 mg/dL (ref 0.61–1.24)
GFR, Estimated: >60 mL/min (ref >60)
Glucose, Bld: 169 mg/dL — ABNORMAL HIGH (ref 70–99)
Potassium: 4.6 mmol/L (ref 3.5–5.1)
Sodium: 135 mmol/L (ref 135–145)
Total Bilirubin: 1.5 mg/dL — ABNORMAL HIGH (ref 0.3–1.2)
Total Protein: 6.9 g/dL (ref 6.5–8.1)

## 2022-11-29 LAB — DIFFERENTIAL
Abs Immature Granulocytes: 0.05 10*3/uL (ref 0.00–0.07)
Basophils Absolute: 0.1 10*3/uL (ref 0.0–0.1)
Basophils Relative: 1 %
Eosinophils Absolute: 0.1 10*3/uL (ref 0.0–0.5)
Eosinophils Relative: 1 %
Immature Granulocytes: 1 %
Lymphocytes Relative: 17 %
Lymphs Abs: 1.3 10*3/uL (ref 0.7–4.0)
Monocytes Absolute: 0.5 10*3/uL (ref 0.1–1.0)
Monocytes Relative: 6 %
Neutro Abs: 5.9 10*3/uL (ref 1.7–7.7)
Neutrophils Relative %: 74 %

## 2022-11-29 LAB — PROTIME-INR
INR: 1.1 (ref 0.8–1.2)
Prothrombin Time: 13.7 seconds (ref 11.4–15.2)

## 2022-11-29 LAB — CBG MONITORING, ED: Glucose-Capillary: 163 mg/dL — ABNORMAL HIGH (ref 70–99)

## 2022-11-29 LAB — ETHANOL: Alcohol, Ethyl (B): <10 mg/dL (ref <10)

## 2022-11-29 LAB — APTT: aPTT: 25 seconds (ref 24–36)

## 2022-11-29 MED ORDER — SODIUM CHLORIDE 0.9% FLUSH: 3.0000 mL | Freq: Once | INTRAVENOUS | Status: DC

## 2022-11-29 MED ORDER — HYDRALAZINE HCL 20 MG/ML IJ SOLN
5.0000 mg | Freq: Once | INTRAMUSCULAR | Status: DC
  Filled 2022-11-29: qty 1

## 2022-11-29 NOTE — ED Notes (Signed)
Pt transported to MRI - will administer medication upon return to room.

## 2022-11-29 NOTE — ED Provider Notes (Signed)
[facility] Provider Note    Event Date/Time   First MD Initiated Contact with Patient 11/29/22 1345     (approximate)   History   Code Stroke   HPI  Andrew [name] is a 67 y.o. male with history of diabetes, hypogonadism, and chronic low back pain who presents with acute onset of dizziness and around 8:30 AM and subsequent blurred vision.  The patient states that he had already been up for a while when he started to feel dizzy.  He cannot characterize the dizziness as either lightheadedness, near-syncope, or vertigo, but states that it waxed and waned for a few hours then he noticed he had blurred vision.  He denies any associated vision changes, difficulty speaking, weakness or numbness, or any chest pain, difficulty breathing, or GI symptoms.  The patient states he is compliant with his blood pressure medications and that his blood pressure has been under good control recently.  He recently increased his dose of metformin after an elevated hemoglobin A1c.  I reviewed the past medical records.  The patient's most recent outpatient encounter was with family medicine on 2/13 for routine medical examination.  The patient has had no recent ED visits or admissions.   Physical Exam   Triage Vital Signs: ED Triage Vitals  Enc Vitals Group     BP 11/29/22 1342 (!) 182/92     Pulse Rate 11/29/22 1342 64     Resp 11/29/22 1342 19     Temp 11/29/22 1342 98 F (36.7 C)     Temp src --      SpO2 11/29/22 1342 96 %     Weight --      Height --      Head Circumference --      Peak Flow --      Pain Score 11/29/22 1341 0     Pain Loc --      Pain Edu? --      Excl. in [place]? --     Most recent vital signs: Vitals:   11/29/22 1500 11/29/22 1530  BP: (!) 140/74 (!) 142/68  Pulse: 60 (!) 59  Resp: 19 16  Temp:    SpO2: 92% 95%     General: Awake, no distress.  CV:  Good peripheral perfusion.  Resp:  Normal effort.  Abd:  No distention.  Other:  EOMI.   PERRLA.  No facial droop.  Motor and sensory intact in all extremities.  No ataxia.  NIHSS 0.   ED Results / Procedures / Treatments   Labs (all labs ordered are listed, but only abnormal results are displayed) Labs Reviewed  CBC - Abnormal; Notable for the following components:      Result Value   RBC 5.83 (*)    Hemoglobin 18.3 (*)    HCT 53.8 (*)    Platelets 148 (*)    All other components within normal limits  COMPREHENSIVE METABOLIC PANEL - Abnormal; Notable for the following components:   Glucose, Bld 169 (*)    Total Bilirubin 1.5 (*)    All other components within normal limits  CBG MONITORING, ED - Abnormal; Notable for the following components:   Glucose-Capillary 163 (*)    All other components within normal limits  PROTIME-INR  APTT  DIFFERENTIAL  ETHANOL  CBG MONITORING, ED  I-STAT CREATININE, ED     EKG  ED ECG REPORT I, Arta Silence, the attending physician, personally viewed and interpreted this ECG.  Date: 11/29/2022 EKG Time: 1344 Rate: 61 Rhythm: normal sinus rhythm QRS Axis: normal Intervals: normal ST/T Wave abnormalities: normal Narrative Interpretation: no evidence of acute ischemia    RADIOLOGY  CT head: I independently viewed and interpreted the images; is no ICH.  Radiology report indicates no acute abnormality.  MR brain:  IMPRESSION:  1. No acute intracranial abnormality.  2. Minimal chronic small vessel ischemic disease.    PROCEDURES:  Critical Care performed: Yes, see critical care procedure note(s)  .Critical Care  Performed by: Arta Silence, MD Authorized by: Arta Silence, MD   Critical care provider statement:    Critical care time (minutes):  30   Critical care time was exclusive of:  Separately billable procedures and treating other patients   Critical care was necessary to treat or prevent imminent or life-threatening deterioration of the following conditions:  CNS failure or compromise    Critical care was time spent personally by me on the following activities:  Development of treatment plan with patient or surrogate, discussions with consultants, evaluation of patient's response to treatment, examination of patient, ordering and review of laboratory studies, ordering and review of radiographic studies, ordering and performing treatments and interventions, pulse oximetry, re-evaluation of patient's condition, review of old charts and obtaining history from patient or surrogate    MEDICATIONS ORDERED IN ED: Medications  sodium chloride flush (NS) 0.9 % injection 3 mL ( Intravenous Canceled Entry 11/29/22 1400)  hydrALAZINE (APRESOLINE) injection 5 mg (5 mg Intravenous Not Given 11/29/22 1455)     IMPRESSION / MDM / [place] / ED COURSE  I reviewed the triage vital signs and the nursing notes.  67 year old male with PMH as noted above presents with acute onset of dizziness around 830 with subsequent blurred vision.  The symptoms are now improving.  On exam the patient is hypertensive with otherwise normal vital signs.  Neurologic exam is currently normal.  NIHSS is 0.  Differential diagnosis includes, but is not limited to, CVA, TIA, hypertensive urgency.  There is no evidence of cardiac etiology.  The patient has no chest pain or difficulty breathing and EKG is normal.  CT head is negative.  I consulted and discussed the case with Dr. Leonel Ramsay from neurology who has evaluated the patient.  He recommends MRI for further evaluation and blood pressure control.  Patient's presentation is most consistent with acute presentation with potential threat to life or bodily function.  The patient is on the cardiac monitor to evaluate for evidence of arrhythmia and/or significant heart rate changes.   ----------------------------------------- 4:07 PM on 11/29/2022 -----------------------------------------  MRI of the brain is negative.  Blood pressure improved even before  hydralazine was given, so the patient has not needed any medication.  His symptoms have completely resolved.  I did consider whether the patient may require admission, however I consulted again with Dr. Leonel Ramsay who feels that the symptoms are likely hypertension related.  There is no evidence of acute stroke.  He does not recommend admission for further workup.  Cardiac enzymes were not ordered with the initial stroke panel.  However, the patient has not had any chest pain, shortness of breath, or other symptoms concerning for ACS or other cardiac etiology, and his EKG is normal.  There is no indication for cardiac enzymes at this time.  On reassessment the patient feels well and is comfortable going home.  I counseled him on the results of the workup and return precautions; he expressed understanding.  He agrees  to follow-up with his regular doctor.   FINAL CLINICAL IMPRESSION(S) / ED DIAGNOSES   Final diagnoses:  Hypertensive urgency     Rx / DC Orders   ED Discharge Orders     None        Note:  This document was prepared using Dragon voice recognition software and may include unintentional dictation errors.    Arta Silence, MD 11/29/22 [phone]

## 2022-11-29 NOTE — ED Notes (Signed)
Pt taken to CT 1 at this time.

## 2022-11-29 NOTE — ED Triage Notes (Signed)
Pt comes with c/o dizziness, HTN and blurry vision. Pt states this all started at 11am today. Pt states still having blurry vision. Pt denies any numbness.   Pt is not on thinners. Pt does take BP. Pt did take today at 0800.

## 2022-11-29 NOTE — Discharge Instructions (Signed)
We suspect that your dizziness and blurred vision were related to elevated blood pressure.  Your blood pressure is improved now.  Continue blood pressure medications as prescribed and keep a log of your blood pressures over the next few days.  Return to the ER for new, worsening, or persistent severe dizziness, blurred vision, severe headache, vomiting, chest pain, difficulty breathing, persistently elevated blood pressure readings especially over 200 on the top number or 120 on the bottom number, or any other new or worsening symptoms that concern you.

## 2022-11-29 NOTE — Progress Notes (Signed)
This Chap responded to Code STEMI, Wife in the room Pt in scan. Supplied Support to Ms. Andrew [name] by active listening to their life reflections. Made her aware she is not alone - Melven Sartorius is part of her husband care team. Kindly reach out as need arise.   11/29/22 1400  Spiritual Encounters  Type of Visit Initial  Care provided to: Family  Conversation partners present during encounter Nurse  Referral source Code page  Reason for visit Code  OnCall Visit Yes  Spiritual Framework  Presenting Themes Impactful experiences and emotions;Courage hope and growth  Community/Connection Family  Interventions  Spiritual Care Interventions Made Established relationship of care and support;Mindfulness intervention  Intervention Outcomes  Outcomes Reduced isolation;Connection to spiritual care;Awareness of support  Spiritual Care Plan  Spiritual Care Issues Still Outstanding No further spiritual care needs at this time (see row info)

## 2022-11-29 NOTE — Consult Note (Signed)
Neurology Consultation Reason for Consult: Blurred vision Referring Physician: Kennith Gain  CC: Blurry vision  History is obtained from: Patient  HPI: Andrew [name] is a 67 y.o. male who first noticed that he was mildly dizzy around 8:30 this morning.  This has been present to some degree, waxing and waning in intensity since that time, but then around 11 AM he noticed that his vision was blurry.  Because of the blurry vision, he sought care at his doctor's office who referred him to the emergency department given the acute onset of neurological symptoms.   LKW: 8:30 AM tnk given?: no, out of window Past Medical History:  Diagnosis Date   Chronic pain syndrome    Depression    Diverticulosis    DVT (deep venous thrombosis) (HCC)    bilateral, was on anticoags for 67month   Ear infection 08/2016   CLEARING UP-FINISHED ANTIBIOTIC AND PREDNISONE TAPER    Family history of adverse reaction to anesthesia    BROTHER-N/V   Hernia, umbilical    History of kidney stones    Hyperlipidemia    Hypertension    IFG (impaired fasting glucose)    Knee pain, chronic    Pes anserine bursitis      Family History  Problem Relation Age of Onset   Alcohol abuse Father    Hypertension Father    Cirrhosis Father    Hypertension Brother    Stroke Paternal Grandmother    Stroke Paternal Grandfather    Pneumonia Mother      Social History:  reports that he quit smoking about 11 years ago. His smoking use included cigarettes. He has a 37.50 pack-year smoking history. He has never used smokeless tobacco. He reports current alcohol use. He reports that he does not use drugs.   Exam: Current vital signs: BP (!) 182/92   Pulse 64   Temp 98 F (36.7 C)   Resp 19   SpO2 96%  Vital signs in last 24 hours: Temp:  [98 F (36.7 C)] 98 F (36.7 C) (02/29 1342) Pulse Rate:  [64] 64 (02/29 1342) Resp:  [19] 19 (02/29 1342) BP: (182)/(92) 182/92 (02/29 1342) SpO2:  [96 %] 96 % (02/29  1342)   Physical Exam  Appears well-developed and well-nourished.   Neuro: Mental Status: Patient is awake, alert, oriented to person, place, month, year, and situation. Patient is able to give a clear and coherent history. No signs of aphasia or neglect Cranial Nerves: II: Visual Fields are full. Pupils are equal, round, and reactive to light.   III,IV, VI: EOMI without ptosis or diploplia.  V: Facial sensation is symmetric to temperature VII: Facial movement is symmetric.  VIII: hearing is intact to voice X: Uvula elevates symmetrically XI: Shoulder shrug is symmetric. XII: tongue is midline without atrophy or fasciculations.  Motor: Tone is normal. Bulk is normal. 5/5 strength was present in all four extremities.  Sensory: Sensation is symmetric to light touch and temperature in the arms and legs. Deep Tendon Reflexes: 2+ and symmetric in the biceps and patellae.  Plantars: Toes are downgoing bilaterally.  Cerebellar: FNF and HKS are intact bilaterally      I have reviewed labs in epic and the results pertinent to this consultation are: BG 163  I have reviewed the images obtained: CT head - negative  Impression: 67year old male with nonspecific dizziness and binocular blurred vision in the setting of severe hypertension.  The symptoms are common during hypertensive urgency, but I  would favor getting an MRI to rule out the possibility of a small stroke causing this both symptoms as well as his hypertension response.  If MRI is negative, then I would favor addressing this is a hypertensive urgency  Recommendations: 1) MRI brain 2) if negative, would treat as acute hypertension, with no further neurological workup unless other symptoms were to occur.   Roland Rack, MD [facility] [phone]  If 7pm- 7am, please page neurology on call as listed in [place].

## 2022-11-29 NOTE — ED Notes (Signed)
Code stroke called to [place] per Sam RN with blurred vision, dizzy, htn since 11A

## 2022-11-29 NOTE — Code Documentation (Signed)
Stroke Response Nurse Documentation Code Documentation  Andrew [name] is a 67 y.o. male arriving to [facility] via [employer] on 11/29/2022 with past medical hx of HLD, HTN. On No antithrombotic. Code stroke was activated by ED.   Patient from [place] [facility] where he was LKW at [facility] and now complaining of onset blurry vision, dizziness, and high blood pressure. Patient reports feeling dizzy this morning around 0830 that has continued since. Around 1100 he noticed he had blurry vision and sought care at [facility] for reported symptoms. He was subsequently referred to the ED for eval where a code stroke was called.   Stroke team meets patient in CT.  Patient to CT with team. NIHSS 0, see documentation for details and code stroke times. Patient with NIHSS 0 on exam. The following imaging was completed:  CT Head. Patient is not a candidate for IV Thrombolytic due to outside window, per MD. Patient is not a candidate for IR due to LVO not suspected, per MD.   Care Plan: q 2 h NIHSS and vital signs, stroke swallow screen per protocol.   Bedside handoff with ED RN Claiborne Billings.    Charise Carwin  Stroke Response RN

## 2022-11-29 NOTE — ED Notes (Signed)
Code stroke called to Charge, Network engineer and CT at this time.

## 2022-11-29 NOTE — ED Notes (Signed)
Pt back in room from CT 

## 2022-11-30 ENCOUNTER — Ambulatory Visit: Payer: Self-pay | Admitting: *Deleted

## 2022-11-30 ENCOUNTER — Encounter: Payer: Self-pay | Admitting: Physician Assistant

## 2022-11-30 ENCOUNTER — Other Ambulatory Visit: Payer: Self-pay | Admitting: Physician Assistant

## 2022-11-30 ENCOUNTER — Ambulatory Visit (INDEPENDENT_AMBULATORY_CARE_PROVIDER_SITE_OTHER): Payer: PPO | Admitting: Physician Assistant

## 2022-11-30 VITALS — BP 143/79 | HR 62 | Temp 98.3°F | Wt 244.7 lb

## 2022-11-30 DIAGNOSIS — I10 Essential (primary) hypertension: Secondary | ICD-10-CM

## 2022-11-30 MED ORDER — AMLODIPINE BESYLATE 2.5 MG PO TABS: 2.5000 mg | ORAL_TABLET | Freq: Every day | ORAL | 1 refills | Status: DC

## 2022-11-30 NOTE — Telephone Encounter (Signed)
  Chief Complaint: elevated BP Symptoms: elevated BP- was seen at ED yesterday, nausea, jittery feeling, soft stool Frequency: started yesterday Pertinent Negatives: Patient denies blurred vision, chest pain, difficulty breathing, headache, weakness Disposition: '[]'$ ED /'[]'$ Urgent Care (no appt availability in office) / '[x]'$ Appointment(In office/virtual)/ '[]'$  [place] [facility]/ '[]'$ Home Care/ '[]'$ Refused Recommended Disposition /'[]'$ [place] [facility]/ '[]'$  Follow-up with PCP Additional Notes: Call to office- request if they have cancellation- please let patient know so he can come in sooner. Patient advised ED if he starts having symptoms.

## 2022-11-30 NOTE — Assessment & Plan Note (Signed)
Chronic, historic conditions

## 2022-11-30 NOTE — Progress Notes (Unsigned)
Acute Office Visit   Patient: Andrew [name]   DOB: 1956/08/26   67 y.o. Male  MRN: [id number] Visit Date: 11/30/2022  Today's healthcare provider: Dani Gobble Venola Castello, PA-C  Introduced myself to the patient as a Journalist, newspaper and provided education on APPs in clinical practice.    Chief Complaint  Patient presents with   Hypertension    Pt states he started having issues with elevated BP yesterday. States he stopped by a local fire department and BP was over 200. Went straight to the ER for evaluation. States his BP came down some, has kept readings since the ER visit.    Hyperglycemia    Pt states that his BS has been running a little high, 244 this morning fasting    Subjective    Hypertension Associated symptoms include palpitations (states he can feel his heart thumping). Pertinent negatives include no chest pain or headaches.  Hyperglycemia Pertinent negatives include no chest pain or headaches.   HPI     Hypertension    Additional comments: Pt states he started having issues with elevated BP yesterday. States he stopped by a local fire department and BP was over 200. Went straight to the ER for evaluation. States his BP came down some, has kept readings since the ER visit.         Hyperglycemia    Additional comments: Pt states that his BS has been running a little high, 244 this morning fasting       Last edited by Georgina Peer, CMA on 11/30/2022  1:15 PM.        Concern for elevated BP   Onset: sudden  Duration: yesterday  States he woke up and he felt dizzy and noted his BP was elevated  Reports a few days before this he noted he was very fatigued after doing just a few hours of minor work around house which is not typical for him. He states he can usually feel pretty normal and non-winded doing similar activity in the past   Patient was evaluated in ED on 11/29/22- EKG, CT head and MRI head were normal, Reviewed labs which were overall reassuring  He  reports he had dizziness and blurry vision during initial episode  Reports today he is feeling a bit better but feels like his heart is racing a bit   Thinks he is having a bit of anxiety  He states he has had very little appetite the past 2 days   He is still taking his Diclofenac daily  He reports his blood glucose has been elevated- today was 244 this AM, fasting measure  He denies recent increased salt intake and has been trying to avoid pork, salt and saturated fats  Medications: Outpatient Medications Prior to Visit  Medication Sig   benazepril (LOTENSIN) 40 MG tablet Take 1 tablet (40 mg total) by mouth daily.   buPROPion (WELLBUTRIN SR) 200 MG 12 hr tablet Take 1 tablet (200 mg total) by mouth 2 (two) times daily.   diclofenac (VOLTAREN) 75 MG EC tablet Take 1 tablet (75 mg total) by mouth 2 (two) times daily.   gabapentin (NEURONTIN) 300 MG capsule TAKE 2 CAPSULES(600 MG) BY MOUTH TWICE DAILY   hydrochlorothiazide (MICROZIDE) 12.5 MG capsule Take 1 capsule (12.5 mg total) by mouth daily.   metFORMIN (GLUCOPHAGE-XR) 500 MG 24 hr tablet TAKE 2 TABLETS(1000 MG) BY MOUTH TWICE DAILY   metoprolol succinate (TOPROL-XL) 100 MG 24  hr tablet TAKE 1 TABLET BY MOUTH  DAILY WITH OR IMMEDIATELY  FOLLOWING A MEAL   sildenafil (VIAGRA) 100 MG tablet    tadalafil (CIALIS) 5 MG tablet TAKE 1 TABLET(5 MG) BY MOUTH DAILY   testosterone cypionate (DEPOTESTOTERONE CYPIONATE) 100 MG/ML injection Inject 0.75 mLs (75 mg total) into the muscle every 14 (fourteen) days. For IM use only   traMADol (ULTRAM) 50 MG tablet 2 tabs in the AM and 1 tab in the PM   No facility-administered medications prior to visit.    Review of Systems  Eyes:  Negative for visual disturbance.  Respiratory:  Negative for chest tightness.   Cardiovascular:  Positive for palpitations (states he can feel his heart thumping). Negative for chest pain and leg swelling.  Neurological:  Negative for dizziness, light-headedness and  headaches.    {Labs  Heme  Chem  Endocrine  Serology  Results Review (optional):23779}   Objective    BP (!) 143/79 (BP Location: Left Arm, Cuff Size: Normal)   Pulse 62   Temp 98.3 F (36.8 C) (Oral)   Wt 244 lb 11.2 oz (111 kg)   SpO2 95%   BMI 38.33 kg/m  {Show previous vital signs (optional):23777}  Physical Exam Vitals reviewed.  Constitutional:      General: He is awake.     Appearance: Normal appearance. He is well-developed and well-groomed.  HENT:     Head: Normocephalic and atraumatic.  Cardiovascular:     Rate and Rhythm: Normal rate and regular rhythm.     Pulses: Normal pulses.          Radial pulses are 2+ on the right side and 2+ on the left side.     Heart sounds: Normal heart sounds. No murmur heard.    No friction rub. No gallop.  Pulmonary:     Effort: Pulmonary effort is normal.     Breath sounds: Normal breath sounds. No decreased air movement. No decreased breath sounds, wheezing, rhonchi or rales.  Musculoskeletal:     Cervical back: Normal range of motion.     Right lower leg: No edema.     Left lower leg: No edema.  Skin:    General: Skin is warm and dry.  Neurological:     General: No focal deficit present.     Mental Status: He is alert and oriented to person, place, and time. Mental status is at baseline.  Psychiatric:        Mood and Affect: Mood normal.        Behavior: Behavior normal. Behavior is cooperative.        Thought Content: Thought content normal.        Judgment: Judgment normal.       No results found for any visits on 11/30/22.  Assessment & Plan      No follow-ups on file.      Problem List Items Addressed This Visit       Cardiovascular and Mediastinum   Primary hypertension - Primary   Relevant Medications   amLODipine (NORVASC) 2.5 MG tablet   Other Relevant Orders   Ambulatory referral to Cardiology     Return in about 4 weeks (around 12/28/2022) for HTN, BP medication change.   I, Lafe Clerk E  Areyanna Figeroa, PA-C, have reviewed all documentation for this visit. The documentation on 11/30/22 for the exam, diagnosis, procedures, and orders are all accurate and complete.   Talitha Givens, MHS, PA-C [facility]

## 2022-11-30 NOTE — Patient Instructions (Signed)
Please get a new BP cuff and continue to check your BP daily and record it so we can review at your follow up  I am starting you on a medication called Amlodipine 2.5 mg for you to take by mouth once per day - please start this and take with your other medications  We would like to see you back in about 4 weeks to discuss how you are responding to the changes  The referral team should reach out to help schedule your apt so be on the lookout for their call.

## 2022-11-30 NOTE — Telephone Encounter (Signed)
Reason for Disposition . [id number] Systolic BP  >= [id number] OR Diastolic >= 123456 AND [id number] having NO cardiac or neurologic symptoms  Answer Assessment - Initial Assessment Questions 1. BLOOD PRESSURE: "What is the blood pressure?" "Did you take at least two measurements 5 minutes apart?"     214/108- yesterday- went to ED, 173/92 2. ONSET: "When did you take your blood pressure?"     Yesterday am, 8:00 3. HOW: "How did you take your blood pressure?" (e.g., automatic home BP monitor, visiting nurse)     Automatic cuff- arm 4. HISTORY: "Do you have a history of high blood pressure?"     yes 5. MEDICINES: "Are you taking any medicines for blood pressure?" "Have you missed any doses recently?"     no 6. OTHER SYMPTOMS: "Do you have any symptoms?" (e.g., blurred vision, chest pain, difficulty breathing, headache, weakness)      Yesterday-Dizziness,blurred vision- not today, nausea, jittery, soft stool  Protocols used: Blood Pressure - High-A-AH

## 2022-12-03 NOTE — Telephone Encounter (Signed)
Requested Prescriptions  Pending Prescriptions Disp Refills   amLODipine (NORVASC) 2.5 MG tablet [Pharmacy Med Name: AMLODIPINE BESYLATE 2.'5MG'$  TABLETS] 90 tablet 0    Sig: TAKE 1 TABLET(2.5 MG) BY MOUTH DAILY     Cardiovascular: Calcium Channel Blockers 2 Failed - 11/30/2022  1:57 PM      Failed - Last BP in normal range    BP Readings from Last 1 Encounters:  11/30/22 (!) 143/79         Passed - Last Heart Rate in normal range    Pulse Readings from Last 1 Encounters:  11/30/22 62         Passed - Valid encounter within last 6 months    Recent Outpatient Visits           3 days ago Primary hypertension   [facility] [place], Dani Gobble, PA-C   2 weeks ago [facility] to [employer] preventive visit   [place], [place], DO   3 months ago Hypogonadism in male   [place], [place], DO   6 months ago Benign hypertensive renal disease   [place], Megan P, DO   10 months ago Controlled type 2 diabetes mellitus with microalbuminuria, without long-term current use of insulin [facility])   [place], Barb Merino, DO       Future Appointments             In 2 weeks [place], Ander Slade, MD [place] [facility]   In 1 month Wynetta Emery, Barb Merino, DO [place], [place]   In 2 months Wynetta Emery, Barb Merino, DO [place], [facility]

## 2022-12-17 NOTE — Progress Notes (Unsigned)
Cardiology Office Note:    Date:  12/19/2022   ID:  Andrew [name], DOB [birth year], MRN [id number]  PCP:  Valerie Roys, DO   [place] Providers Cardiologist:  None     Referring MD: Valerie Roys, DO   Chief Complaint  Patient presents with   Shortness of Breath    History of Present Illness:    Andrew [name] is a 67 y.o. male seen at the request of Erin Mecum PA-C  for evaluation of SOB and palpitations.  He has a history of HTN,  HLD, remote DVT. Was seen recently in ED on 2/29 for severe HTN associated with dizziness. BP was over [id number] systolic at [employer]. 182/92 at ED. Labs OK. Cranial CT and MRI negative. Ecg normal. Was on Benazepril, HCT and metoprolol. When seen back by PCP was started on amlodipine.   Since amlodipine added BP has steadily come down. He notes when his BP was really high he did have some SOB and few palpitations. This has resolved. No chest pain. He did have a stress nuclear test in 2015 that was apparently normal. He has been following a good diet and has lost 5 lbs. Last A1c elevated to 8%. Cholesterol and triglycerides elevated as well.   Past Medical History:  Diagnosis Date   Chronic pain syndrome    Depression    Diverticulosis    DVT (deep venous thrombosis) ([place])    bilateral, was on anticoags for 51months   Ear infection 08/2016   CLEARING UP-FINISHED ANTIBIOTIC AND PREDNISONE TAPER    Family history of adverse reaction to anesthesia    BROTHER-N/V   Hernia, umbilical    History of kidney stones    Hyperlipidemia    Hypertension    IFG (impaired fasting glucose)    Knee pain, chronic    Pes anserine bursitis     Past Surgical History:  Procedure Laterality Date   COLONOSCOPY  2015   JOINT REPLACEMENT Bilateral 2009   replacement and revision knee   KIDNEY STONE SURGERY     SHOULDER ARTHROSCOPY WITH ROTATOR CUFF REPAIR Right    UMBILICAL HERNIA REPAIR N/A 09/06/2016   Procedure: HERNIA REPAIR UMBILICAL ADULT;   Surgeon: Robert Bellow, MD;  Location: [facility] ORS;  Service: General;  Laterality: N/A;    Current Medications: Current Meds  Medication Sig   amLODipine (NORVASC) 2.5 MG tablet TAKE 1 TABLET(2.5 MG) BY MOUTH DAILY   aspirin EC 81 MG tablet Take 1 tablet (81 mg total) by mouth daily. Swallow whole.   benazepril (LOTENSIN) 40 MG tablet Take 1 tablet (40 mg total) by mouth daily.   buPROPion (WELLBUTRIN SR) 200 MG 12 hr tablet Take 1 tablet (200 mg total) by mouth 2 (two) times daily.   diclofenac (VOLTAREN) 75 MG EC tablet Take 1 tablet (75 mg total) by mouth 2 (two) times daily.   gabapentin (NEURONTIN) 300 MG capsule TAKE 2 CAPSULES(600 MG) BY MOUTH TWICE DAILY   hydrochlorothiazide (MICROZIDE) 12.5 MG capsule Take 1 capsule (12.5 mg total) by mouth daily.   metFORMIN (GLUCOPHAGE-XR) 500 MG 24 hr tablet TAKE 2 TABLETS(1000 MG) BY MOUTH TWICE DAILY   metoprolol succinate (TOPROL-XL) 100 MG 24 hr tablet TAKE 1 TABLET BY MOUTH  DAILY WITH OR IMMEDIATELY  FOLLOWING A MEAL   rosuvastatin (CRESTOR) 20 MG tablet Take 1 tablet (20 mg total) by mouth daily.   tadalafil (CIALIS) 5 MG tablet TAKE 1 TABLET(5 MG) BY MOUTH DAILY  testosterone cypionate (DEPOTESTOTERONE CYPIONATE) 100 MG/ML injection Inject 0.75 mLs (75 mg total) into the muscle every 14 (fourteen) days. For IM use only   traMADol (ULTRAM) 50 MG tablet 2 tabs in the AM and 1 tab in the PM     Allergies:   Other and Flomax [tamsulosin]   Social History   Socioeconomic History   Marital status: Married    Spouse name: Not on file   Number of children: 2   Years of education: Not on file   Highest education level: Not on file  Occupational History   Not on file  Tobacco Use   Smoking status: Former    Packs/day: 1.50    Years: 25.00    Additional pack years: 0.00    Total pack years: 37.50    Types: Cigarettes    Quit date: 06/28/2011    Years since quitting: 11.4   Smokeless tobacco: Never  Vaping Use   Vaping Use:  Never used  Substance and Sexual Activity   Alcohol use: Yes    Comment: on occasion   Drug use: No   Sexual activity: Yes    Birth control/protection: None  Other Topics Concern   Not on file  Social History Narrative   Not on file   Social Determinants of Health   Financial Resource Strain: Not on file  Food Insecurity: No Food Insecurity (06/12/2022)   Hunger Vital Sign    Worried About Running Out of Food in the Last Year: Never true    Ran Out of Food in the Last Year: Never true  Transportation Needs: No Transportation Needs (06/12/2022)   PRAPARE - Hydrologist (Medical): No    Lack of Transportation (Non-Medical): No  Physical Activity: Not on file  Stress: Not on file  Social Connections: Not on file     Family History: The patient's family history includes Alcohol abuse in his father; Cirrhosis in his father; Hypertension in his brother and father; Pneumonia (age of onset: 78) in his mother; Stroke in his paternal grandfather and paternal grandmother.  ROS:   Please see the history of present illness.   Wife does note he has snored heavily in the past and had some apnea. Reports this has been better of late.   All other systems reviewed and are negative.  EKGs/Labs/Other Studies Reviewed:    The following studies were reviewed today: none  EKG:  EKG is not ordered today.  The ekg ordered 11/29/22  demonstrates NSR with normal Ecg. I have personally reviewed and interpreted this study.   Recent Labs: 11/13/2022: TSH 4.210 11/29/2022: ALT 34; BUN 21; Creatinine, Ser 1.04; Hemoglobin 18.3; Platelets 148; Potassium 4.6; Sodium 135  Recent Lipid Panel    Component Value Date/Time   CHOL 193 11/13/2022 0829   CHOL 167 04/19/2015 0918   TRIG 191 (H) 11/13/2022 0829   TRIG 108 04/19/2015 0918   HDL 31 (L) 11/13/2022 0829   VLDL 22 04/19/2015 0918   LDLCALC 128 (H) 11/13/2022 0829     Risk Assessment/Calculations:         Sleep  Apnea Evaluation  [facility]  Today's Date: 12/19/2022   Patient Name: Andrew [name]        DOB: January 25, 1956       Height:  5\' 7"  (1.702 m)     Weight: 246 lb 3.2 oz (111.7 kg)  BMI: Body mass index is 38.56 kg/m.  STOP-BANG RISK ASSESSMENT         If STOP-BANG Score ?3 OR two clinical symptoms - patient qualifies for WatchPAT (CPT 95800)      Sleep study ordered due to two (2) of the following clinical symptoms/diagnoses:  Excessive daytime sleepiness G47.10  Gastroesophageal reflux K21.9  Nocturia R35.1  Morning Headaches G44.221  Difficulty concentrating R41.840  Memory problems or poor judgment G31.84  Personality changes or irritability R45.4  Loud snoring R06.83  Depression F32.9  Unrefreshed by sleep G47.8  Impotence N52.9  History of high blood pressure R03.0  Insomnia G47.00  Sleep Disordered Breathing or Sleep Apnea ICD G47.33           Physical Exam:    VS:  BP 136/72 (BP Location: Left Arm, Patient Position: Sitting, Cuff Size: Large)   Pulse 62   Ht 5\' 7"  (1.702 m)   Wt 246 lb 3.2 oz (111.7 kg)   SpO2 96%   BMI 38.56 kg/m     Wt Readings from Last 3 Encounters:  12/19/22 246 lb 3.2 oz (111.7 kg)  11/30/22 244 lb 11.2 oz (111 kg)  11/13/22 248 lb 9.6 oz (112.8 kg)     GEN:  Well nourished, morbidly obese in no acute distress HEENT: Normal NECK: No JVD; No carotid bruits LYMPHATICS: No lymphadenopathy CARDIAC: RRR, no murmurs, rubs, gallops RESPIRATORY:  Clear to auscultation without rales, wheezing or rhonchi  ABDOMEN: Soft, non-tender, non-distended MUSCULOSKELETAL:  No edema; No deformity  SKIN: Warm and dry NEUROLOGIC:  Alert and oriented x 3 PSYCHIATRIC:  Normal affect   ASSESSMENT:    1. Controlled type 2 diabetes mellitus with microalbuminuria, without long-term current use of insulin ([place])   2. Metabolic syndrome   3. Mixed hyperlipidemia   4. Primary hypertension   5. Morbid obesity ([place])     PLAN:    In order of problems listed above:  HTN - blood pressure improved with addition of amlodipine. Will continue current antihypertensive therapy.  Metabolic syndrome with morbid obesity, HTN, elevated triglcyerides, low HDL and morbid obesity. This places patient at elevated CV risk. Recommend he take ASA 81 mg daily. Will add statin with Crestor 20 mg daily. Target LDL optimally < 55. Discussed appropriate lifestyle modification with plant based, whole food, Mediterranean style diet regular aerobic activity. Since glycemic control is not optimal would recommend starting a GLP-1 agonist mainly to reduce CV risk but also to lose weight and improve glycemic control - will arrange visit with our Pharm D to discuss.  Mixed HLD. Add Crestor. Repeat fasting lab in 3 months DM type 2- see #2 Morbid obesity. High risk for OSA with Stop Bang score of 7. Will arrange Itmar home sleep study.   Patient is scheduled for CT chest for cancer screening. We will be able to review this and see if he has significant coronary calcification.            Medication Adjustments/Labs and Tests Ordered: Current medicines are reviewed at length with the patient today.  Concerns regarding medicines are outlined above.  Orders Placed This Encounter  Procedures   Basic metabolic panel   Lipid panel   Hepatic function panel   HgB A1c   AMB Referral to [facility] Pharm-D   Meds ordered this encounter  Medications   rosuvastatin (CRESTOR) 20 MG tablet    Sig: Take 1 tablet (20 mg total) by mouth daily.    Dispense:  90 tablet    Refill:  3  aspirin EC 81 MG tablet    Sig: Take 1 tablet (81 mg total) by mouth daily. Swallow whole.    Dispense:  90 tablet    Refill:  3    Patient Instructions  Medication Instructions:  Start Crestor 20 mg daily Continue all other medications *If you need a refill on your cardiac medications before your next appointment, please call your pharmacy*   Lab  Work: Bmet,lipid and hepatic panels,A1c 4 to 5 days before 3 month appointment    Testing/Procedures:    Follow-Up: At [facility], you and your health needs are our priority.  As part of our continuing mission to provide you with exceptional heart care, we have created designated Provider Care Teams.  These Care Teams include your primary Cardiologist (physician) and Advanced Practice Providers (APPs -  Physician Assistants and Nurse Practitioners) who all work together to provide you with the care you need, when you need it.  We recommend signing up for the patient portal called "MyChart".  Sign up information is provided on this After Visit Summary.  MyChart is used to connect with patients for Virtual Visits (Telemedicine).  Patients are able to view lab/test results, encounter notes, upcoming appointments, etc.  Non-urgent messages can be sent to your provider as well.   To learn more about what you can do with MyChart, go to [email or web address].    Your next appointment: 3 months     Provider:  Dr.Caprina Wussow  Schedule appointment with Pharmacist in [place], Latangela Mccomas [place], MD  12/19/2022 11:35 AM    [place]

## 2022-12-19 ENCOUNTER — Ambulatory Visit: Payer: PPO | Attending: Cardiology | Admitting: Cardiology

## 2022-12-19 ENCOUNTER — Encounter: Payer: Self-pay | Admitting: Cardiology

## 2022-12-19 VITALS — BP 136/72 | HR 62 | Ht 67.0 in | Wt 246.2 lb

## 2022-12-19 DIAGNOSIS — I10 Essential (primary) hypertension: Secondary | ICD-10-CM | POA: Diagnosis not present

## 2022-12-19 DIAGNOSIS — E8881 Metabolic syndrome: Secondary | ICD-10-CM | POA: Diagnosis not present

## 2022-12-19 DIAGNOSIS — R809 Proteinuria, unspecified: Secondary | ICD-10-CM | POA: Diagnosis not present

## 2022-12-19 DIAGNOSIS — R0683 Snoring: Secondary | ICD-10-CM | POA: Diagnosis not present

## 2022-12-19 DIAGNOSIS — E1129 Type 2 diabetes mellitus with other diabetic kidney complication: Secondary | ICD-10-CM

## 2022-12-19 DIAGNOSIS — E782 Mixed hyperlipidemia: Secondary | ICD-10-CM

## 2022-12-19 MED ORDER — ASPIRIN 81 MG PO TBEC: 81.0000 mg | DELAYED_RELEASE_TABLET | Freq: Every day | ORAL | 3 refills | Status: DC

## 2022-12-19 MED ORDER — ROSUVASTATIN CALCIUM 20 MG PO TABS: 20.0000 mg | ORAL_TABLET | Freq: Every day | ORAL | 3 refills | Status: DC

## 2022-12-19 NOTE — Patient Instructions (Addendum)
Medication Instructions:  Start Crestor 20 mg daily Continue all other medications *If you need a refill on your cardiac medications before your next appointment, please call your pharmacy*   Lab Work: Bmet,lipid and hepatic panels,A1c 4 to 5 days before 3 month appointment    Testing/Procedures: [facility] Sleep Study   Follow-Up: At [facility], you and your health needs are our priority.  As part of our continuing mission to provide you with exceptional heart care, we have created designated Provider Care Teams.  These Care Teams include your primary Cardiologist (physician) and Advanced Practice Providers (APPs -  Physician Assistants and Nurse Practitioners) who all work together to provide you with the care you need, when you need it.  We recommend signing up for the patient portal called "MyChart".  Sign up information is provided on this After Visit Summary.  MyChart is used to connect with patients for Virtual Visits (Telemedicine).  Patients are able to view lab/test results, encounter notes, upcoming appointments, etc.  Non-urgent messages can be sent to your provider as well.   To learn more about what you can do with MyChart, go to [email or web address].    Your next appointment: 3 months     Provider:  Dr.Jordan  Schedule appointment with Pharmacist in [facility]

## 2022-12-21 ENCOUNTER — Telehealth: Payer: Self-pay | Admitting: *Deleted

## 2022-12-21 NOTE — Telephone Encounter (Signed)
Secure chat message sent to Andrew [name] ok to contact the patient to activate itamar device.

## 2022-12-24 ENCOUNTER — Telehealth: Payer: Self-pay

## 2022-12-24 NOTE — Telephone Encounter (Signed)
Called and made the patient aware that HE may proceed with the Itamar Home Sleep Study. PIN # provided to the patient. Patient made aware that HE will be contacted after the test has been read with the results and any recommendations. Patient verbalized understanding and thanked me for the call.   

## 2023-01-04 ENCOUNTER — Ambulatory Visit
Admission: RE | Admit: 2023-01-04 | Discharge: 2023-01-04 | Disposition: A | Discharge status: Home / Self Care | Payer: PPO | Source: Ambulatory Visit | Attending: Family Medicine | Admitting: Family Medicine

## 2023-01-04 ENCOUNTER — Telehealth: Payer: Self-pay

## 2023-01-04 ENCOUNTER — Ambulatory Visit (INDEPENDENT_AMBULATORY_CARE_PROVIDER_SITE_OTHER): Payer: PPO | Admitting: Family Medicine

## 2023-01-04 ENCOUNTER — Encounter: Payer: Self-pay | Admitting: Family Medicine

## 2023-01-04 DIAGNOSIS — Z0000 Encounter for general adult medical examination without abnormal findings: Secondary | ICD-10-CM | POA: Insufficient documentation

## 2023-01-04 DIAGNOSIS — I10 Essential (primary) hypertension: Secondary | ICD-10-CM

## 2023-01-04 DIAGNOSIS — Z87891 Personal history of nicotine dependence: Secondary | ICD-10-CM | POA: Insufficient documentation

## 2023-01-04 DIAGNOSIS — Z136 Encounter for screening for cardiovascular disorders: Secondary | ICD-10-CM | POA: Insufficient documentation

## 2023-01-04 DIAGNOSIS — K760 Fatty (change of) liver, not elsewhere classified: Secondary | ICD-10-CM | POA: Insufficient documentation

## 2023-01-04 DIAGNOSIS — I714 Abdominal aortic aneurysm, without rupture, unspecified: Secondary | ICD-10-CM | POA: Diagnosis not present

## 2023-01-04 DIAGNOSIS — E1129 Type 2 diabetes mellitus with other diabetic kidney complication: Secondary | ICD-10-CM

## 2023-01-04 MED ORDER — ONETOUCH VERIO W/DEVICE KIT
1.0000 | PACK | Freq: Every day | 0 refills | Status: AC
Start: 2023-01-04 — End: ?

## 2023-01-04 MED ORDER — OZEMPIC (0.25 OR 0.5 MG/DOSE) 2 MG/3ML ~~LOC~~ SOPN: 0.2500 mg | PEN_INJECTOR | SUBCUTANEOUS | 0 refills | Status: DC

## 2023-01-04 MED ORDER — ONETOUCH DELICA LANCETS 33G MISC: 1.0000 | Freq: Two times a day (BID) | 5 refills | Status: AC

## 2023-01-04 MED ORDER — GLUCOSE BLOOD VI STRP
ORAL_STRIP | 12 refills | Status: AC
Start: 2023-01-04 — End: ?

## 2023-01-04 MED ORDER — ADULT BLOOD PRESSURE CUFF LG KIT
1.0000 | PACK | Freq: Every day | 0 refills | Status: DC
Start: 2023-01-04 — End: 2024-05-25

## 2023-01-04 MED ORDER — AMLODIPINE BESYLATE 2.5 MG PO TABS: ORAL_TABLET | ORAL | 1 refills | Status: DC

## 2023-01-04 NOTE — Progress Notes (Signed)
BP 135/85   Pulse 61   Temp 98.4 F (36.9 C) (Oral)   Wt 242 lb 9.6 oz (110 kg)   SpO2 97%   BMI 38.00 kg/m    Subjective:    Patient ID: Andrew [name], male    DOB: [birth year], 67 y.o.   MRN: [id number]  HPI: Andrew [name] is a 67 y.o. male  Chief Complaint  Patient presents with   Hypertension   HYPERTENSION  Hypertension status: better  Satisfied with current treatment? yes Duration of hypertension: chronic BP monitoring frequency:  not checking BP medication side effects:  no Medication compliance: excellent compliance Previous BP meds:amlodipine, benazepril, metoprolol, HCTZ Aspirin: yes Recurrent headaches: no Visual changes: no Palpitations: no Dyspnea: no Chest pain: no Lower extremity edema: no Dizzy/lightheaded: no  Relevant past medical, surgical, family and social history reviewed and updated as indicated. Interim medical history since our last visit reviewed. Allergies and medications reviewed and updated.  Review of Systems  Constitutional: Negative.   Respiratory: Negative.    Cardiovascular: Negative.   Musculoskeletal: Negative.     Per HPI unless specifically indicated above     Objective:    BP 135/85   Pulse 61   Temp 98.4 F (36.9 C) (Oral)   Wt 242 lb 9.6 oz (110 kg)   SpO2 97%   BMI 38.00 kg/m   Wt Readings from Last 3 Encounters:  01/04/23 242 lb 9.6 oz (110 kg)  12/19/22 246 lb 3.2 oz (111.7 kg)  11/30/22 244 lb 11.2 oz (111 kg)    Physical Exam Vitals and nursing note reviewed.  Constitutional:      General: He is not in acute distress.    Appearance: Normal appearance. He is obese. He is not ill-appearing, toxic-appearing or diaphoretic.  HENT:     Head: Normocephalic and atraumatic.     Right Ear: External ear normal.     Left Ear: External ear normal.     Nose: Nose normal.     Mouth/Throat:     Mouth: Mucous membranes are moist.     Pharynx: Oropharynx is clear.  Eyes:     General: No scleral icterus.        Right eye: No discharge.        Left eye: No discharge.     Extraocular Movements: Extraocular movements intact.     Conjunctiva/sclera: Conjunctivae normal.     Pupils: Pupils are equal, round, and reactive to light.  Cardiovascular:     Rate and Rhythm: Normal rate and regular rhythm.     Pulses: Normal pulses.     Heart sounds: Normal heart sounds. No murmur heard.    No friction rub. No gallop.  Pulmonary:     Effort: Pulmonary effort is normal. No respiratory distress.     Breath sounds: Normal breath sounds. No stridor. No wheezing, rhonchi or rales.  Chest:     Chest wall: No tenderness.  Musculoskeletal:        General: Normal range of motion.     Cervical back: Normal range of motion and neck supple.  Skin:    General: Skin is warm and dry.     Capillary Refill: Capillary refill takes less than 2 seconds.     Coloration: Skin is not jaundiced or pale.     Findings: No bruising, erythema, lesion or rash.  Neurological:     General: No focal deficit present.     Mental Status: He is alert and  oriented to person, place, and time. Mental status is at baseline.  Psychiatric:        Mood and Affect: Mood normal.        Behavior: Behavior normal.        Thought Content: Thought content normal.        Judgment: Judgment normal.     Results for orders placed or performed during the hospital encounter of 11/29/22  Protime-INR  Result Value Ref Range   Prothrombin Time 13.7 11.4 - 15.2 seconds   INR 1.1 0.8 - 1.2  APTT  Result Value Ref Range   aPTT 25 24 - 36 seconds  CBC  Result Value Ref Range   WBC 7.9 4.0 - 10.5 K/uL   RBC 5.83 (H) 4.22 - 5.81 MIL/uL   Hemoglobin 18.3 (H) 13.0 - 17.0 g/dL   HCT 82.9 (H) 56.2 - 13.0 %   MCV 92.3 80.0 - 100.0 fL   MCH 31.4 26.0 - 34.0 pg   MCHC 34.0 30.0 - 36.0 g/dL   RDW 86.5 78.4 - 69.6 %   Platelets 148 (L) 150 - 400 K/uL   nRBC 0.0 0.0 - 0.2 %  Differential  Result Value Ref Range   Neutrophils Relative % 74 %    Neutro Abs 5.9 1.7 - 7.7 K/uL   Lymphocytes Relative 17 %   Lymphs Abs 1.3 0.7 - 4.0 K/uL   Monocytes Relative 6 %   Monocytes Absolute 0.5 0.1 - 1.0 K/uL   Eosinophils Relative 1 %   Eosinophils Absolute 0.1 0.0 - 0.5 K/uL   Basophils Relative 1 %   Basophils Absolute 0.1 0.0 - 0.1 K/uL   Immature Granulocytes 1 %   Abs Immature Granulocytes 0.05 0.00 - 0.07 K/uL  Comprehensive metabolic panel  Result Value Ref Range   Sodium 135 135 - 145 mmol/L   Potassium 4.6 3.5 - 5.1 mmol/L   Chloride 100 98 - 111 mmol/L   CO2 26 22 - 32 mmol/L   Glucose, Bld 169 (H) 70 - 99 mg/dL   BUN 21 8 - 23 mg/dL   Creatinine, Ser 2.95 0.61 - 1.24 mg/dL   Calcium 9.0 8.9 - 28.4 mg/dL   Total Protein 6.9 6.5 - 8.1 g/dL   Albumin 4.3 3.5 - 5.0 g/dL   AST 27 15 - 41 U/L   ALT 34 0 - 44 U/L   Alkaline Phosphatase 69 38 - 126 U/L   Total Bilirubin 1.5 (H) 0.3 - 1.2 mg/dL   GFR, Estimated >13 >24 mL/min   Anion gap 9 5 - 15  Ethanol  Result Value Ref Range   Alcohol, Ethyl (B) <10 <10 mg/dL  CBG monitoring, ED  Result Value Ref Range   Glucose-Capillary 163 (H) 70 - 99 mg/dL      Assessment & Plan:   Problem List Items Addressed This Visit       Cardiovascular and Mediastinum   Primary hypertension    Under good control on current regimen. Continue current regimen. Continue to monitor. Call with any concerns. Refills given.        Relevant Medications   amLODipine (NORVASC) 2.5 MG tablet     Follow up plan: Return in about 4 weeks (around 02/01/2023).

## 2023-01-04 NOTE — Telephone Encounter (Signed)
-----   Message from Dorcas Carrow, DO sent at 01/04/2023  9:03 AM EDT ----- BP cuff please

## 2023-01-04 NOTE — Telephone Encounter (Signed)
Requested Diabetic Supplies and Adult Blood Pressure Cuff ordered and sent to patient's local pharmacy per Dr Laural Benes.

## 2023-01-04 NOTE — Telephone Encounter (Signed)
-----   Message from Dorcas Carrow, DO sent at 01/04/2023  8:59 AM EDT ----- Needs diabetic testing supplies

## 2023-01-05 ENCOUNTER — Encounter: Payer: Self-pay | Admitting: Family Medicine

## 2023-01-05 NOTE — Assessment & Plan Note (Signed)
Under good control on current regimen. Continue current regimen. Continue to monitor. Call with any concerns. Refills given.   

## 2023-01-08 ENCOUNTER — Encounter: Payer: Self-pay | Admitting: Family Medicine

## 2023-01-08 DIAGNOSIS — I714 Abdominal aortic aneurysm, without rupture, unspecified: Secondary | ICD-10-CM | POA: Insufficient documentation

## 2023-01-08 DIAGNOSIS — K760 Fatty (change of) liver, not elsewhere classified: Secondary | ICD-10-CM | POA: Insufficient documentation

## 2023-01-15 ENCOUNTER — Ambulatory Visit: Admission: RE | Admit: 2023-01-15 | Payer: PPO | Source: Ambulatory Visit

## 2023-01-17 ENCOUNTER — Other Ambulatory Visit: Payer: Self-pay | Admitting: *Deleted

## 2023-01-17 DIAGNOSIS — Z122 Encounter for screening for malignant neoplasm of respiratory organs: Secondary | ICD-10-CM

## 2023-01-17 DIAGNOSIS — Z87891 Personal history of nicotine dependence: Secondary | ICD-10-CM

## 2023-01-22 ENCOUNTER — Ambulatory Visit: Payer: PPO

## 2023-01-23 ENCOUNTER — Telehealth: Payer: Self-pay

## 2023-01-23 NOTE — Telephone Encounter (Signed)
Received fax from patient's [facility] stating "There is no longer 100 1 mL vial. Would need to be called in for 10 mL vial to be filled." Please advise?

## 2023-01-24 ENCOUNTER — Encounter: Payer: Self-pay | Admitting: Family Medicine

## 2023-01-25 ENCOUNTER — Ambulatory Visit: Payer: PPO

## 2023-02-12 ENCOUNTER — Encounter: Payer: Self-pay | Admitting: Family Medicine

## 2023-02-12 ENCOUNTER — Ambulatory Visit (INDEPENDENT_AMBULATORY_CARE_PROVIDER_SITE_OTHER): Payer: PPO | Admitting: Family Medicine

## 2023-02-12 VITALS — BP 129/85 | HR 56 | Temp 97.9°F | Wt 232.7 lb

## 2023-02-12 DIAGNOSIS — M545 Low back pain, unspecified: Secondary | ICD-10-CM | POA: Diagnosis not present

## 2023-02-12 DIAGNOSIS — I129 Hypertensive chronic kidney disease with stage 1 through stage 4 chronic kidney disease, or unspecified chronic kidney disease: Secondary | ICD-10-CM

## 2023-02-12 DIAGNOSIS — E291 Testicular hypofunction: Secondary | ICD-10-CM

## 2023-02-12 DIAGNOSIS — G8929 Other chronic pain: Secondary | ICD-10-CM

## 2023-02-12 DIAGNOSIS — R809 Proteinuria, unspecified: Secondary | ICD-10-CM

## 2023-02-12 DIAGNOSIS — E1129 Type 2 diabetes mellitus with other diabetic kidney complication: Secondary | ICD-10-CM

## 2023-02-12 LAB — BAYER DCA HB A1C WAIVED: HB A1C (BAYER DCA - WAIVED): 6.3 % — ABNORMAL HIGH (ref 4.8–5.6)

## 2023-02-12 NOTE — Progress Notes (Signed)
BP 129/85   Pulse (!) 56   Temp 97.9 F (36.6 C) (Oral)   Wt 232 lb 11.2 oz (105.6 kg)   SpO2 98%   BMI 36.45 kg/m    Subjective:    Patient ID: Andrew [name], male    DOB: Mar 13, 1956, 67 y.o.   MRN: [id number]  HPI: Andrew [name] is a 67 y.o. male  Chief Complaint  Patient presents with   Hypertension    Pt states that had to stop taking the Crestor due to making him feel nauseous and receiving low BP readings at home     Diabetes    Pt states has a upcoming eye exam   DIABETES Hypoglycemic episodes:no Polydipsia/polyuria: no Visual disturbance: no Chest pain: no Paresthesias: no Glucose Monitoring: no  Accucheck frequency: Not Checking  Fasting glucose: 136-200 Taking Insulin?: no Blood Pressure Monitoring: not checking Retinal Examination: Not up to Date Foot Exam: Up to Date Diabetic Education: Completed Pneumovax: Up to Date Influenza: Up to Date Aspirin: no  HYPERTENSION- stopped his amlodipine due to low blood pressure Hypertension status: better  Satisfied with current treatment? yes Duration of hypertension: chronic BP monitoring frequency:  not checking BP range:  BP medication side effects:  no Medication compliance: excellent compliance Previous BP meds:benazepril, metoprolol, HCTZ Aspirin: no Recurrent headaches: no Visual changes: no Palpitations: no Dyspnea: no Chest pain: no Lower extremity edema: no Dizzy/lightheaded: no  LOW TESTOSTERONE- has been off his medicine due to an issue with the pharmacy Duration: chronic Status: uncontrolled  Satisfied with current treatment:  no Medication side effects:  no Medication compliance: good compliance Decreased libido: no Fatigue: no Depressed mood: no Muscle weakness: no Erectile dysfunction: no  CHRONIC PAIN  Present dose:  10 Morphine equivalents Pain control status: controlled Duration: chronic Location: low back Quality: aching and sore Current Pain Level: mild Previous  Pain Level: moderate Breakthrough pain: no Benefit from narcotic medications: yes What Activities task can be accomplished with current medication? Able to do his ADLs Interested in weaning off narcotics:no   Stool softners/OTC fiber: no  Previous pain specialty evaluation: yes Non-narcotic analgesic meds: yes Narcotic contract: yes  Relevant past medical, surgical, family and social history reviewed and updated as indicated. Interim medical history since our last visit reviewed. Allergies and medications reviewed and updated.  Review of Systems  Constitutional: Negative.   Respiratory: Negative.    Cardiovascular: Negative.   Gastrointestinal: Negative.   Musculoskeletal:  Positive for arthralgias, back pain and myalgias. Negative for gait problem, joint swelling, neck pain and neck stiffness.  Skin: Negative.   Neurological: Negative.     Per HPI unless specifically indicated above     Objective:    BP 129/85   Pulse (!) 56   Temp 97.9 F (36.6 C) (Oral)   Wt 232 lb 11.2 oz (105.6 kg)   SpO2 98%   BMI 36.45 kg/m   Wt Readings from Last 3 Encounters:  02/12/23 232 lb 11.2 oz (105.6 kg)  01/04/23 242 lb 9.6 oz (110 kg)  12/19/22 246 lb 3.2 oz (111.7 kg)    Physical Exam Vitals and nursing note reviewed.  Constitutional:      General: He is not in acute distress.    Appearance: Normal appearance. He is not ill-appearing, toxic-appearing or diaphoretic.  HENT:     Head: Normocephalic and atraumatic.     Right Ear: External ear normal.     Left Ear: External ear normal.  Nose: Nose normal.     Mouth/Throat:     Mouth: Mucous membranes are moist.     Pharynx: Oropharynx is clear.  Eyes:     General: No scleral icterus.       Right eye: No discharge.        Left eye: No discharge.     Extraocular Movements: Extraocular movements intact.     Conjunctiva/sclera: Conjunctivae normal.     Pupils: Pupils are equal, round, and reactive to light.  Cardiovascular:      Rate and Rhythm: Normal rate and regular rhythm.     Pulses: Normal pulses.     Heart sounds: Normal heart sounds. No murmur heard.    No friction rub. No gallop.  Pulmonary:     Effort: Pulmonary effort is normal. No respiratory distress.     Breath sounds: Normal breath sounds. No stridor. No wheezing, rhonchi or rales.  Chest:     Chest wall: No tenderness.  Musculoskeletal:        General: Normal range of motion.     Cervical back: Normal range of motion and neck supple.  Skin:    General: Skin is warm and dry.     Capillary Refill: Capillary refill takes less than 2 seconds.     Coloration: Skin is not jaundiced or pale.     Findings: No bruising, erythema, lesion or rash.  Neurological:     General: No focal deficit present.     Mental Status: He is alert and oriented to person, place, and time. Mental status is at baseline.  Psychiatric:        Mood and Affect: Mood normal.        Behavior: Behavior normal.        Thought Content: Thought content normal.        Judgment: Judgment normal.     Results for orders placed or performed in visit on 02/12/23  Bayer DCA Hb A1c Waived  Result Value Ref Range   HB A1C (BAYER DCA - WAIVED) 6.3 (H) 4.8 - 5.6 %  Comprehensive metabolic panel  Result Value Ref Range   Glucose 118 (H) 70 - 99 mg/dL   BUN 30 (H) 8 - 27 mg/dL   Creatinine, Ser 8.11 0.76 - 1.27 mg/dL   eGFR 63 >91 YN/WGN/5.62   BUN/Creatinine Ratio 24 10 - 24   Sodium 141 134 - 144 mmol/L   Potassium 4.7 3.5 - 5.2 mmol/L   Chloride 104 96 - 106 mmol/L   CO2 22 20 - 29 mmol/L   Calcium 9.2 8.6 - 10.2 mg/dL   Total Protein 5.7 (L) 6.0 - 8.5 g/dL   Albumin 4.1 3.9 - 4.9 g/dL   Globulin, Total 1.6 1.5 - 4.5 g/dL   Albumin/Globulin Ratio 2.6 (H) 1.2 - 2.2   Bilirubin Total 0.8 0.0 - 1.2 mg/dL   Alkaline Phosphatase 73 44 - 121 IU/L   AST 23 0 - 40 IU/L   ALT 33 0 - 44 IU/L  Lipid Panel w/o Chol/HDL Ratio  Result Value Ref Range   Cholesterol, Total 168 100 -  199 mg/dL   Triglycerides 130 (H) 0 - 149 mg/dL   HDL 34 (L) >86 mg/dL   VLDL Cholesterol Cal 29 5 - 40 mg/dL   LDL Chol Calc (NIH) 578 (H) 0 - 99 mg/dL  Testosterone, free, total([facility])  Result Value Ref Range   Testosterone 55 (L) 264 - 916 ng/dL   Testosterone, Free 2.8 (L)  6.6 - 18.1 pg/mL   Sex Hormone Binding 33.6 19.3 - 76.4 nmol/L      Assessment & Plan:   Problem List Items Addressed This Visit       Endocrine   Hypogonadism in male    Rechecking labs today. Await results. Treat as needed.       Relevant Orders   Testosterone, free, total([facility]) (Completed)   Controlled type 2 diabetes mellitus with microalbuminuria (HCC) - Primary    Doing great with A1c of 6.3. Continue current regimen. Continue to monitor. Call with any concerns.       Relevant Medications   rosuvastatin (CRESTOR) 20 MG tablet   Semaglutide,0.25 or 0.5MG /DOS, (OZEMPIC, 0.25 OR 0.5 MG/DOSE,) 2 MG/3ML SOPN   Other Relevant Orders   Bayer DCA Hb A1c Waived (Completed)   Comprehensive metabolic panel (Completed)   Lipid Panel w/o Chol/HDL Ratio (Completed)     Genitourinary   Benign hypertensive renal disease    Under good control on current regimen. Continue current regimen. Continue to monitor. Call with any concerns. Refills given. Labs drawn today.          Other   Chronic low back pain    Under good control on current regimen. Continue current regimen. Continue to monitor. Call with any concerns. Refills given for 3 months. Follow up 3 months.        Relevant Medications   traMADol (ULTRAM) 50 MG tablet     Follow up plan: Return in about 3 months (around 05/15/2023).

## 2023-02-13 LAB — COMPREHENSIVE METABOLIC PANEL
AST: 23 IU/L (ref 0–40)
Bilirubin Total: 0.8 mg/dL (ref 0.0–1.2)
Calcium: 9.2 mg/dL (ref 8.6–10.2)
Chloride: 104 mmol/L (ref 96–106)
Creatinine, Ser: 1.26 mg/dL (ref 0.76–1.27)
eGFR: 63 mL/min/{1.73_m2} (ref >59)

## 2023-02-13 LAB — LIPID PANEL W/O CHOL/HDL RATIO: Triglycerides: 167 mg/dL — ABNORMAL HIGH (ref 0–149)

## 2023-02-13 LAB — TESTOSTERONE, FREE, TOTAL, SHBG: Testosterone: 55 ng/dL — ABNORMAL LOW (ref 264–916)

## 2023-02-13 NOTE — Telephone Encounter (Signed)
I spoke with the patient about completing his at home sleep study, he states that he will try to get it done this weekend.I will do a follow up call on Monday if the test isn't completed.

## 2023-02-14 NOTE — Progress Notes (Signed)
Virtual Visit via Telephone Note  I connected with Andrew [name] on 02/15/23 at  9:00 AM EDT by telephone and verified that I am speaking with the correct person using two identifiers.  Location: Patient: Home Provider: Office    I discussed the limitations, risks, security and privacy concerns of performing an evaluation and management service by telephone and the availability of in person appointments. I also discussed with the patient that there may be a patient responsible charge related to this service. The patient expressed understanding and agreed to proceed.  Shared Decision Making Visit Lung Cancer Screening Program [phone])   Eligibility: Age 67 y.o. Pack Years Smoking History Calculation 56 (# packs/per year x # years smoked) Recent History of coughing up blood  no Unexplained weight loss? no ( >Than 15 pounds within the last 6 months ) Prior History Lung / other cancer no (Diagnosis within the last 5 years already requiring surveillance chest CT Scans). Smoking Status Former Smoker Former Smokers: Years since quit: 13 years  Quit Date: January 2011  Visit Components: Discussion included one or more decision making aids. yes Discussion included risk/benefits of screening. yes Discussion included potential follow up diagnostic testing for abnormal scans. yes Discussion included meaning and risk of over diagnosis. yes Discussion included meaning and risk of False Positives. yes Discussion included meaning of total radiation exposure. yes  Counseling Included: Importance of adherence to annual lung cancer LDCT screening. yes Impact of comorbidities on ability to participate in the program. yes Ability and willingness to under diagnostic treatment. yes  Smoking Cessation Counseling: Current Smokers:  Discussed importance of smoking cessation. yes Information about tobacco cessation classes and interventions provided to patient. yes Patient provided with "ticket" for  LDCT Scan. yes Symptomatic Patient. no  Counseling(Intermediate counseling: > three minutes) 99406 Diagnosis Code: Tobacco Use Z72.0 Asymptomatic Patient yes  Counseling (Intermediate counseling: > three minutes counseling) U0454 Former Smokers:  Discussed the importance of maintaining cigarette abstinence. yes Diagnosis Code: Personal History of Nicotine Dependence. U98.119 Information about tobacco cessation classes and interventions provided to patient. Yes Patient provided with "ticket" for LDCT Scan. yes Written Order for Lung Cancer Screening with LDCT placed in Epic. Yes (CT Chest Lung Cancer Screening Low Dose W/O CM) [id number] Z12.2-Screening of respiratory organs Z87.891-Personal history of nicotine dependence  I have spent 25 minutes of face to face/ virtual visit   time with [name] discussing the risks and benefits of lung cancer screening. We viewed / discussed a power point together that explained in detail the above noted topics. We paused at intervals to allow for questions to be asked and answered to ensure understanding.We discussed that the single most powerful action that he can take to decrease his risk of developing lung cancer is to quit smoking. We discussed whether or not he is ready to commit to setting a quit date. We discussed options for tools to aid in quitting smoking including nicotine replacement therapy, non-nicotine medications, support groups, Quit Smart classes, and behavior modification. We discussed that often times setting smaller, more achievable goals, such as eliminating 1 cigarette a day for a week and then 2 cigarettes a day for a week can be helpful in slowly decreasing the number of cigarettes smoked. This allows for a sense of accomplishment as well as providing a clinical benefit. I provided him  with smoking cessation  information  with contact information for community resources, classes, free nicotine replacement therapy, and access to mobile  apps, text messaging,  and on-line smoking cessation help. I have also provided him  the office contact information in the event he needs to contact me, or the screening staff. We discussed the time and location of the scan, and that either Abigail Miyamoto RN, Karlton Lemon, RN  or I will call / send a letter with the results within 24-72 hours of receiving them. The patient verbalized understanding of all of  the above and had no further questions upon leaving the office. They have my contact information in the event they have any further questions.  I spent 3-5 minutes counseling on smoking cessation and the health risks of continued tobacco abuse.  I explained to the patient that there has been a high incidence of coronary artery disease noted on these exams. I explained that this is a non-gated exam therefore degree or severity cannot be determined. This patient is not on statin therapy (stopped two weeks ago). I have asked the patient to follow-up with their PCP regarding any incidental finding of coronary artery disease and management with diet or medication as their PCP  feels is clinically indicated. The patient verbalized understanding of the above and had no further questions upon completion of the visit.   Glenford Bayley, NP

## 2023-02-14 NOTE — Patient Instructions (Signed)
Thank you for participating in the [facility]. It was our pleasure to meet you today. We will call you with the results of your scan within the next few days. Your scan will be assigned a Lung RADS category score by the physicians reading the scans.  This Lung RADS score determines follow up scanning.  See below for description of categories, and follow up screening recommendations. We will be in touch to schedule your follow up screening annually or based on recommendations of our providers. We will fax a copy of your scan results to your Primary Care Physician, or the physician who referred you to the program, to ensure they have the results. Please call the office if you have any questions or concerns regarding your scanning experience or results.  Our office number is [phone]. Please speak with Denise Phelps, RN. , or  Denise Buckner RN, They are  our Lung Cancer Screening RN.'s If They are unavailable when you call, Please leave a message on the voice mail. We will return your call at our earliest convenience.This voice mail is monitored several times a day.  Remember, if your scan is normal, we will scan you annually as long as you continue to meet the criteria for the program. (Age 50-80, Current smoker or smoker who has quit within the last 15 years). If you are a smoker, remember, quitting is the single most powerful action that you can take to decrease your risk of lung cancer and other pulmonary, breathing related problems. We know quitting is hard, and we are here to help.  Please let us know if there is anything we can do to help you meet your goal of quitting. If you are a former smoker, congratulations. We are proud of you! Remain smoke free! Remember you can refer friends or family members through the number above.  We will screen them to make sure they meet criteria for the program. Thank you for helping us take better care of you by  participating in Lung Screening.  You can receive free nicotine replacement therapy ( patches, gum or mints) by calling 1-800-QUIT NOW. Please call so we can get you on the path to becoming  a non-smoker. I know it is hard, but you can do this!  Lung RADS Categories:  Lung RADS 1: no nodules or definitely non-concerning nodules.  Recommendation is for a repeat annual scan in 12 months.  Lung RADS 2:  nodules that are non-concerning in appearance and behavior with a very low likelihood of becoming an active cancer. Recommendation is for a repeat annual scan in 12 months.  Lung RADS 3: nodules that are probably non-concerning , includes nodules with a low likelihood of becoming an active cancer.  Recommendation is for a 6-month repeat screening scan. Often noted after an upper respiratory illness. We will be in touch to make sure you have no questions, and to schedule your 6-month scan.  Lung RADS 4 A: nodules with concerning findings, recommendation is most often for a follow up scan in 3 months or additional testing based on our provider's assessment of the scan. We will be in touch to make sure you have no questions and to schedule the recommended 3 month follow up scan.  Lung RADS 4 B:  indicates findings that are concerning. We will be in touch with you to schedule additional diagnostic testing based on our provider's  assessment of the scan.  Other options for assistance in smoking cessation (   As covered by your insurance benefits)  Hypnosis for smoking cessation  [facility] [employer] [phone]  Acupuncture for smoking cessation  [facility] [phone]   

## 2023-02-15 ENCOUNTER — Ambulatory Visit (INDEPENDENT_AMBULATORY_CARE_PROVIDER_SITE_OTHER): Payer: PPO | Admitting: Primary Care

## 2023-02-15 ENCOUNTER — Ambulatory Visit
Admission: RE | Admit: 2023-02-15 | Discharge: 2023-02-15 | Disposition: A | Discharge status: Home / Self Care | Payer: PPO | Source: Ambulatory Visit | Attending: Acute Care | Admitting: Acute Care

## 2023-02-15 DIAGNOSIS — Z87891 Personal history of nicotine dependence: Secondary | ICD-10-CM | POA: Diagnosis not present

## 2023-02-15 DIAGNOSIS — F17210 Nicotine dependence, cigarettes, uncomplicated: Secondary | ICD-10-CM | POA: Diagnosis not present

## 2023-02-15 DIAGNOSIS — Z122 Encounter for screening for malignant neoplasm of respiratory organs: Secondary | ICD-10-CM | POA: Diagnosis not present

## 2023-02-16 LAB — COMPREHENSIVE METABOLIC PANEL
ALT: 33 IU/L (ref 0–44)
Albumin/Globulin Ratio: 2.6 — ABNORMAL HIGH (ref 1.2–2.2)
Albumin: 4.1 g/dL (ref 3.9–4.9)
Alkaline Phosphatase: 73 IU/L (ref 44–121)
BUN/Creatinine Ratio: 24 (ref 10–24)
BUN: 30 mg/dL — ABNORMAL HIGH (ref 8–27)
CO2: 22 mmol/L (ref 20–29)
Globulin, Total: 1.6 g/dL (ref 1.5–4.5)
Glucose: 118 mg/dL — ABNORMAL HIGH (ref 70–99)
Potassium: 4.7 mmol/L (ref 3.5–5.2)
Sodium: 141 mmol/L (ref 134–144)
Total Protein: 5.7 g/dL — ABNORMAL LOW (ref 6.0–8.5)

## 2023-02-16 LAB — LIPID PANEL W/O CHOL/HDL RATIO
Cholesterol, Total: 168 mg/dL (ref 100–199)
HDL: 34 mg/dL — ABNORMAL LOW (ref >39)
LDL Chol Calc (NIH): 105 mg/dL — ABNORMAL HIGH (ref 0–99)
VLDL Cholesterol Cal: 29 mg/dL (ref 5–40)

## 2023-02-16 LAB — TESTOSTERONE, FREE, TOTAL, SHBG: Sex Hormone Binding: 33.6 nmol/L (ref 19.3–76.4)

## 2023-02-17 MED ORDER — OZEMPIC (0.25 OR 0.5 MG/DOSE) 2 MG/3ML ~~LOC~~ SOPN: 0.2500 mg | PEN_INJECTOR | SUBCUTANEOUS | 1 refills | Status: DC

## 2023-02-17 MED ORDER — METOPROLOL SUCCINATE ER 100 MG PO TB24: 50.0000 mg | ORAL_TABLET | Freq: Every day | ORAL | Status: DC

## 2023-02-17 MED ORDER — TESTOSTERONE CYPIONATE 100 MG/ML IM SOLN: 75.0000 mg | INTRAMUSCULAR | 5 refills | Status: DC

## 2023-02-17 MED ORDER — TRAMADOL HCL 50 MG PO TABS: 50.0000 mg | ORAL_TABLET | Freq: Two times a day (BID) | ORAL | 2 refills | Status: DC

## 2023-02-17 MED ORDER — ROSUVASTATIN CALCIUM 20 MG PO TABS: 20.0000 mg | ORAL_TABLET | ORAL | 0 refills | Status: DC

## 2023-02-17 NOTE — Assessment & Plan Note (Signed)
Under good control on current regimen. Continue current regimen. Continue to monitor. Call with any concerns. Refills given. Labs drawn today.   

## 2023-02-17 NOTE — Assessment & Plan Note (Signed)
Rechecking labs today. Await results. Treat as needed.  °

## 2023-02-17 NOTE — Assessment & Plan Note (Signed)
Under good control on current regimen. Continue current regimen. Continue to monitor. Call with any concerns. Refills given for 3 months. Follow up 3 months.    

## 2023-02-17 NOTE — Assessment & Plan Note (Signed)
Doing great with A1c of 6.3. Continue current regimen. Continue to monitor. Call with any concerns.  

## 2023-02-18 ENCOUNTER — Other Ambulatory Visit: Payer: Self-pay | Admitting: Family Medicine

## 2023-02-18 ENCOUNTER — Telehealth: Payer: Self-pay | Admitting: Family Medicine

## 2023-02-18 DIAGNOSIS — E291 Testicular hypofunction: Secondary | ICD-10-CM

## 2023-02-18 MED ORDER — TESTOSTERONE CYPIONATE 100 MG/ML IM SOLN: 75.0000 mg | INTRAMUSCULAR | 5 refills | Status: DC

## 2023-02-18 MED ORDER — TRAMADOL HCL 50 MG PO TABS: 50.0000 mg | ORAL_TABLET | Freq: Two times a day (BID) | ORAL | 2 refills | Status: DC

## 2023-02-18 NOTE — Telephone Encounter (Signed)
Patient called the office and states that his Depotesterone Cypionate and Ultram medications were sent to the wrong pharmacy. Patient is requesting the RX be sent to [facility]. [facility] will not take a RX transfer. Please advise

## 2023-02-19 ENCOUNTER — Other Ambulatory Visit: Payer: Self-pay | Admitting: Family Medicine

## 2023-02-19 ENCOUNTER — Other Ambulatory Visit: Payer: Self-pay | Admitting: Acute Care

## 2023-02-19 ENCOUNTER — Encounter: Payer: Self-pay | Admitting: Family Medicine

## 2023-02-19 DIAGNOSIS — I2510 Atherosclerotic heart disease of native coronary artery without angina pectoris: Secondary | ICD-10-CM | POA: Insufficient documentation

## 2023-02-19 DIAGNOSIS — I700 Atherosclerosis of aorta: Secondary | ICD-10-CM | POA: Insufficient documentation

## 2023-02-19 DIAGNOSIS — Z122 Encounter for screening for malignant neoplasm of respiratory organs: Secondary | ICD-10-CM

## 2023-02-19 DIAGNOSIS — J449 Chronic obstructive pulmonary disease, unspecified: Secondary | ICD-10-CM | POA: Insufficient documentation

## 2023-02-19 DIAGNOSIS — E119 Type 2 diabetes mellitus without complications: Secondary | ICD-10-CM | POA: Diagnosis not present

## 2023-02-19 DIAGNOSIS — Z87891 Personal history of nicotine dependence: Secondary | ICD-10-CM

## 2023-02-19 LAB — HM DIABETES EYE EXAM

## 2023-02-22 ENCOUNTER — Other Ambulatory Visit: Payer: Self-pay | Admitting: Family Medicine

## 2023-02-22 ENCOUNTER — Encounter: Payer: Self-pay | Admitting: Family Medicine

## 2023-02-22 ENCOUNTER — Telehealth: Payer: Self-pay

## 2023-02-22 MED ORDER — TADALAFIL 5 MG PO TABS: ORAL_TABLET | ORAL | 1 refills | Status: DC

## 2023-02-22 MED ORDER — ROSUVASTATIN CALCIUM 20 MG PO TABS: 20.0000 mg | ORAL_TABLET | ORAL | 0 refills | Status: DC

## 2023-02-22 MED ORDER — BUPROPION HCL ER (SR) 200 MG PO TB12: 200.0000 mg | ORAL_TABLET | Freq: Two times a day (BID) | ORAL | 0 refills | Status: DC

## 2023-02-22 MED ORDER — OZEMPIC (0.25 OR 0.5 MG/DOSE) 2 MG/3ML ~~LOC~~ SOPN: 0.2500 mg | PEN_INJECTOR | SUBCUTANEOUS | 1 refills | Status: DC

## 2023-02-22 NOTE — Telephone Encounter (Signed)
Spoke with Nature conservation officer and she informed that the patient should have at least one more month on all of his medications except his CIALIS prescription. Please advise?

## 2023-02-22 NOTE — Telephone Encounter (Signed)
-----   Message from Dorcas Carrow, DO sent at 02/22/2023  9:17 AM EDT ----- Can we check with [facility]- they say he doesn't have refills, but he should have another refill available nowish for his meds- can we please check?

## 2023-03-01 ENCOUNTER — Telehealth: Payer: Self-pay | Admitting: Family Medicine

## 2023-03-01 ENCOUNTER — Telehealth: Payer: Self-pay

## 2023-03-01 NOTE — Telephone Encounter (Signed)
Copied from CRM [id number]. Topic: General - Other >> Mar 01, 2023 12:28 PM Franchot Heidelberg wrote: Reason for CRM: Pt called to check the status of the prior authorization request submitted this week from his insurance. For Ozempic, please advise pt would like a call back

## 2023-03-01 NOTE — Telephone Encounter (Signed)
Reached out to patient via MyChart. 

## 2023-03-01 NOTE — Telephone Encounter (Signed)
Called patient to inform him that he would need to contact his pharmacy to have them add the Ozempic prescription as the [facility] pharmacy technician says that the order got deleted and the patient would just need to reach out and have them add it back and it should go through as his prior authorization was approved.   ZOX:[id number]

## 2023-03-24 NOTE — Progress Notes (Deleted)
Cardiology Office Note:    Date:  03/24/2023   ID:  Andrew [name], DOB 1956-08-24, MRN [id number]  PCP:  Dorcas Carrow, DO   [facility] Providers Cardiologist:  None     Referring MD: Dorcas Carrow, DO   No chief complaint on file.   History of Present Illness:    Andrew [name] is a 67 y.o. male seen at the request of Erin Mecum PA-C  for evaluation of SOB and palpitations.  He has a history of HTN,  HLD, remote DVT. Was seen recently in ED on 2/29 for severe HTN associated with dizziness. BP was over 200 systolic at [employer]. 182/92 at ED. Labs OK. Cranial CT and MRI negative. Ecg normal. Was on Benazepril, HCT and metoprolol. When seen back by PCP was started on amlodipine.   Since amlodipine added BP has steadily come down. He notes when his BP was really high he did have some SOB and few palpitations. This has resolved. No chest pain. He did have a stress nuclear test in 2015 that was apparently normal. He has been following a good diet and has lost 5 lbs. Last A1c elevated to 8%. Cholesterol and triglycerides elevated as well.   Since his last visit Abdominal US showed a 3.9 cm AAA. Fatty liver. Sleep study never done. CT chest for cancer screening did show some emphysema. There was aortic and some coronary calcification.   Past Medical History:  Diagnosis Date   Chronic pain syndrome    Depression    Diverticulosis    DVT (deep venous thrombosis) (HCC)    bilateral, was on anticoags for 3months   Ear infection 08/2016   CLEARING UP-FINISHED ANTIBIOTIC AND PREDNISONE TAPER    Family history of adverse reaction to anesthesia    BROTHER-N/V   Hernia, umbilical    History of kidney stones    Hyperlipidemia    Hypertension    IFG (impaired fasting glucose)    Knee pain, chronic    Pes anserine bursitis     Past Surgical History:  Procedure Laterality Date   COLONOSCOPY  2015   JOINT REPLACEMENT Bilateral 2009   replacement and revision knee    KIDNEY STONE SURGERY     SHOULDER ARTHROSCOPY WITH ROTATOR CUFF REPAIR Right    UMBILICAL HERNIA REPAIR N/A 09/06/2016   Procedure: HERNIA REPAIR UMBILICAL ADULT;  Surgeon: Earline Mayotte, MD;  Location: [facility] ORS;  Service: General;  Laterality: N/A;    Current Medications: No outpatient medications have been marked as taking for the 03/26/23 encounter (Appointment) with [place], Ryland Smoots M, MD.     Allergies:   Other and Flomax [tamsulosin]   Social History   Socioeconomic History   Marital status: Married    Spouse name: Not on file   Number of children: 2   Years of education: Not on file   Highest education level: Not on file  Occupational History   Not on file  Tobacco Use   Smoking status: Former    Packs/day: 1.50    Years: 25.00    Additional pack years: 0.00    Total pack years: 37.50    Types: Cigarettes    Quit date: 06/28/2011    Years since quitting: 11.7   Smokeless tobacco: Never  Vaping Use   Vaping Use: Never used  Substance and Sexual Activity   Alcohol use: Yes    Comment: on occasion   Drug use: No   Sexual activity:  Yes    Birth control/protection: None  Other Topics Concern   Not on file  Social History Narrative   Not on file   Social Determinants of Health   Financial Resource Strain: Patient Declined (02/11/2023)   Overall Financial Resource Strain (CARDIA)    Difficulty of Paying Living Expenses: Patient declined  Food Insecurity: Patient Declined (02/11/2023)   Hunger Vital Sign    Worried About Running Out of Food in the Last Year: Patient declined    Ran Out of Food in the Last Year: Patient declined  Transportation Needs: Patient Declined (02/11/2023)   PRAPARE - Administrator, Civil Service (Medical): Patient declined    Lack of Transportation (Non-Medical): Patient declined  Physical Activity: Unknown (02/11/2023)   Exercise Vital Sign    Days of Exercise per Week: Patient declined    Minutes of Exercise per Session:  Not on file  Stress: Patient Declined (02/11/2023)   [employer] of Occupational Health - Occupational Stress Questionnaire    Feeling of Stress : Patient declined  Social Connections: Unknown (02/11/2023)   Social Connection and Isolation Panel [NHANES]    Frequency of Communication with Friends and Family: Patient declined    Frequency of Social Gatherings with Friends and Family: Patient declined    Attends Religious Services: Patient declined    Database administrator or Organizations: Patient declined    Attends Engineer, structural: Not on file    Marital Status: Patient declined     Family History: The patient's family history includes Alcohol abuse in his father; Cirrhosis in his father; Hypertension in his brother and father; Pneumonia (age of onset: 47) in his mother; Stroke in his paternal grandfather and paternal grandmother.  ROS:   Please see the history of present illness.   Wife does note he has snored heavily in the past and had some apnea. Reports this has been better of late.   All other systems reviewed and are negative.  EKGs/Labs/Other Studies Reviewed:    The following studies were reviewed today: none  EKG:  EKG is not ordered today.  The ekg ordered 11/29/22  demonstrates NSR with normal Ecg. I have personally reviewed and interpreted this study.   Recent Labs: 11/13/2022: TSH 4.210 11/29/2022: Hemoglobin 18.3; Platelets 148 02/12/2023: ALT 33; BUN 30; Creatinine, Ser 1.26; Potassium 4.7; Sodium 141  Recent Lipid Panel    Component Value Date/Time   CHOL 168 02/12/2023 0840   CHOL 167 04/19/2015 0918   TRIG 167 (H) 02/12/2023 0840   TRIG 108 04/19/2015 0918   HDL 34 (L) 02/12/2023 0840   VLDL 22 04/19/2015 0918   LDLCALC 105 (H) 02/12/2023 0840     Risk Assessment/Calculations:      No BP recorded.  {Refresh Note OR Click here to enter BP  :1}***  Sleep Apnea Evaluation  [facility]  Today's Date: 03/24/2023    Patient Name: Andrew [name]        DOB: [birth year]       Height:        Weight:    BMI: There is no height or weight on file to calculate BMI.       STOP-BANG RISK ASSESSMENT       12/19/2022   11:31 AM  STOP-BANG  Do you snore loudly? Yes  Do you often feel tired, fatigued, or sleepy during the daytime? No  Has anyone observed you stop breathing during sleep? Yes  Do  you have (or are you being treated for) high blood pressure? Yes  Recent BMI (Calculated) 38.55  Is BMI greater than 35 kg/m2? 1=Yes  Age older than 64 years old? 1=Yes  Has large neck size > 40 cm (15.7 in, large male shirt size, large male collar size > 16) Yes  Gender - Male 1=Yes  STOP-Bang Total Score 7      If STOP-BANG Score ?3 OR two clinical symptoms - patient qualifies for WatchPAT (CPT 95800)      Sleep study ordered due to two (2) of the following clinical symptoms/diagnoses:  Excessive daytime sleepiness G47.10  Gastroesophageal reflux K21.9  Nocturia R35.1  Morning Headaches G44.221  Difficulty concentrating R41.840  Memory problems or poor judgment G31.84  Personality changes or irritability R45.4  Loud snoring R06.83  Depression F32.9  Unrefreshed by sleep G47.8  Impotence N52.9  History of high blood pressure R03.0  Insomnia G47.00  Sleep Disordered Breathing or Sleep Apnea ICD G47.33      STOP-Bang Score:  7  { Consider Dx Sleep Disordered Breathing or Sleep Apnea  ICD G47.33          :1}     Physical Exam:    VS:  There were no vitals taken for this visit.    Wt Readings from Last 3 Encounters:  02/12/23 232 lb 11.2 oz (105.6 kg)  01/04/23 242 lb 9.6 oz (110 kg)  12/19/22 246 lb 3.2 oz (111.7 kg)     GEN:  Well nourished, morbidly obese in no acute distress HEENT: Normal NECK: No JVD; No carotid bruits LYMPHATICS: No lymphadenopathy CARDIAC: RRR, no murmurs, rubs, gallops RESPIRATORY:  Clear to auscultation without rales, wheezing or rhonchi  ABDOMEN: Soft,  non-tender, non-distended MUSCULOSKELETAL:  No edema; No deformity  SKIN: Warm and dry NEUROLOGIC:  Alert and oriented x 3 PSYCHIATRIC:  Normal affect   ASSESSMENT:    No diagnosis found.  PLAN:    In order of problems listed above:  HTN - blood pressure improved with addition of amlodipine. Will continue current antihypertensive therapy.  Metabolic syndrome with morbid obesity, HTN, elevated triglcyerides, low HDL and morbid obesity. This places patient at elevated CV risk. Recommend he take ASA 81 mg daily. Will add statin with Crestor 20 mg daily. Target LDL optimally < 55. Discussed appropriate lifestyle modification with plant based, whole food, Mediterranean style diet regular aerobic activity. Since glycemic control is not optimal would recommend starting a GLP-1 agonist mainly to reduce CV risk but also to lose weight and improve glycemic control - will arrange visit with our Pharm D to discuss.  Mixed HLD. Add Crestor. Repeat fasting lab in 3 months DM type 2- see #2 Morbid obesity. High risk for OSA with Stop Bang score of 7. Will arrange It[facility] home sleep study.              Medication Adjustments/Labs and Tests Ordered: Current medicines are reviewed at length with the patient today.  Concerns regarding medicines are outlined above.  No orders of the defined types were placed in this encounter.  No orders of the defined types were placed in this encounter.   There are no Patient Instructions on file for this visit.   Signed, Weslee Prestage [place], MD  03/24/2023 8:07 AM    [facility]

## 2023-03-26 ENCOUNTER — Encounter (INDEPENDENT_AMBULATORY_CARE_PROVIDER_SITE_OTHER): Payer: PPO | Admitting: Cardiology

## 2023-03-26 ENCOUNTER — Ambulatory Visit: Payer: PPO | Admitting: Cardiology

## 2023-03-26 DIAGNOSIS — G4733 Obstructive sleep apnea (adult) (pediatric): Secondary | ICD-10-CM

## 2023-03-27 ENCOUNTER — Ambulatory Visit: Payer: PPO | Attending: Cardiology

## 2023-03-27 DIAGNOSIS — R0683 Snoring: Secondary | ICD-10-CM

## 2023-03-27 DIAGNOSIS — E782 Mixed hyperlipidemia: Secondary | ICD-10-CM

## 2023-03-27 DIAGNOSIS — R809 Proteinuria, unspecified: Secondary | ICD-10-CM

## 2023-03-27 DIAGNOSIS — E8881 Metabolic syndrome: Secondary | ICD-10-CM

## 2023-03-27 DIAGNOSIS — I10 Essential (primary) hypertension: Secondary | ICD-10-CM

## 2023-03-27 NOTE — Procedures (Signed)
SLEEP STUDY REPORT Patient Information Study Date: 03/26/2023 Patient Name: Andrew [name] Patient ID: [id number] Birth Date: 1955-12-10 Age: 67 Gender: Male BMI: 38.8 (W=247 lb, H=5' 7'') Referring Physician: Peter [place], MD  TEST DESCRIPTION:  Home sleep apnea testing was completed using the WatchPat, a Type 1 device, utilizing peripheral arterial tonometry (PAT), chest movement, actigraphy, pulse oximetry, pulse rate, body position and snore.  AHI was calculated with apnea and hypopnea using valid sleep time as the denominator. RDI includes apneas, hypopneas, and RERAs.  The data acquired and the scoring of sleep and all associated events were performed in accordance with the recommended standards and specifications as outlined in the AASM Manual for the Scoring of Sleep and Associated Events 2.2.0 (2015).  FINDINGS:  1.  Moderate Obstructive Sleep Apnea with AHI 18.6/hr overall and severe during REM sleep with REM AHI 31.6/hr.   2.  No Central Sleep Apnea with pAHIc 1.1/hr.  3.  Oxygen desaturations as low as 78%.  4.  Mild to moderate snoring was present. O2 sats were < 88% for 3 min.  5.  Total sleep time was 6 hrs and 30 min.  6.  14.7% of total sleep time was spent in REM sleep.   7.  Normal sleep onset latency at 26 min  8.  Prolonged REM sleep onset latency at 228 min.   9.  Total awakenings were 9.  10. Arrhythmia detection:  None  DIAGNOSIS:   Moderate Obstructive Sleep Apnea (G47.33)  RECOMMENDATIONS:   1.  Clinical correlation of these findings is necessary.  The decision to treat obstructive sleep apnea (OSA) is usually based on the presence of apnea symptoms or the presence of associated medical conditions such as Hypertension, Congestive Heart Failure, Atrial Fibrillation or Obesity.  The most common symptoms of OSA are snoring, gasping for breath while sleeping, daytime sleepiness and fatigue.   2.  Initiating apnea therapy is recommended given the  presence of symptoms and/or associated conditions. Recommend proceeding with one of the following:     a.  Auto-CPAP therapy with a pressure range of 5-20cm H2O.     b.  An oral appliance (OA) that can be obtained from certain dentists with expertise in sleep medicine.  These are primarily of use in non-obese patients with mild and moderate disease.     c.  An ENT consultation which may be useful to look for specific causes of obstruction and possible treatment options.     d.  If patient is intolerant to PAP therapy, consider referral to ENT for evaluation for hypoglossal nerve stimulator.   3.  Close follow-up is necessary to ensure success with CPAP or oral appliance therapy for maximum benefit.  4.  A follow-up oximetry study on CPAP is recommended to assess the adequacy of therapy and determine the need for supplemental oxygen or the potential need for Bi-level therapy.  An arterial blood gas to determine the adequacy of baseline ventilation and oxygenation should also be considered.  5.  Healthy sleep recommendations include:  adequate nightly sleep (normal 7-9 hrs/night), avoidance of caffeine after noon and alcohol near bedtime, and maintaining a sleep environment that is cool, dark and quiet.  6.  Weight loss for overweight patients is recommended.  Even modest amounts of weight loss can significantly improve the severity of sleep apnea.  7.  Snoring recommendations include:  weight loss where appropriate, side sleeping, and avoidance of alcohol before bed.  8.  Operation of motor vehicle should not be performed when sleepy.  Signature:   Armanda Magic, MD; [facility]; Diplomat, American Board of Sleep Medicine Electronically Signed: 03/27/2023 9:47:39 AM

## 2023-04-08 ENCOUNTER — Telehealth (INDEPENDENT_AMBULATORY_CARE_PROVIDER_SITE_OTHER): Payer: Self-pay | Admitting: Vascular Surgery

## 2023-04-08 NOTE — Telephone Encounter (Signed)
Spoke with pt in regards to scheduling appt for laser ablation. Pt asked several questions which were answered and pt would like to talk with his wife and think about it over night. Pt stated that he will call back tomorrow.

## 2023-04-29 ENCOUNTER — Telehealth: Payer: Self-pay

## 2023-04-29 NOTE — Telephone Encounter (Signed)
-----   Message from Armanda Magic sent at 03/27/2023  9:48 AM EDT ----- Please let patient know that they have sleep apnea.  Recommend therapeutic CPAP titration for treatment of patient's sleep disordered breathing.  If unable to perform an in lab titration then initiate ResMed auto CPAP from 4 to 15cm H2O with heated humidity and mask of choice and overnight pulse ox on CPAP.

## 2023-04-29 NOTE — Telephone Encounter (Signed)
Notified patient of sleep study results and recommendations. Patient verbalized understanding and stated, "I will think about it and give you a call tomorrow." All questions were answered. Patient verbalized understanding.

## 2023-05-09 ENCOUNTER — Other Ambulatory Visit: Payer: Self-pay | Admitting: Family Medicine

## 2023-05-10 NOTE — Telephone Encounter (Signed)
Requested medication (s) are due for refill today - yes  Requested medication (s) are on the active medication list -yes  Future visit scheduled -yes  Last refill: 02/18/23  1.19ml 5RF  Notes to clinic: off protocol- provider review   Requested Prescriptions  Pending Prescriptions Disp Refills   testosterone cypionate (DEPOTESTOSTERONE CYPIONATE) 200 MG/ML injection [Pharmacy Med Name: TESTOSTERONE CYPIONATE 200 MG/ML IM] 1 mL     Sig: INJECT 0.375 MLS (75 MG TOTAL) INTRAMUSCULARLY EVERY 14 DAYS.     Off-Protocol Failed - 05/09/2023  8:43 AM      Failed - Medication not assigned to a protocol, review manually.      Passed - Valid encounter within last 12 months    Recent Outpatient Visits           2 months ago Controlled type 2 diabetes mellitus with microalbuminuria, without long-term current use of insulin (HCC)   [place] [facility] [place], Megan P, DO   4 months ago Primary hypertension   [place] [facility] [place], Megan P, DO   5 months ago Primary hypertension   [place] [facility] Mecum, Oswaldo Conroy, PA-C   5 months ago [facility] [employer] preventive visit   [place] [facility] [place], [place], DO   8 months ago Hypogonadism in male   [place] [facility] [address], [place], DO       Future Appointments             In 5 days Laural Benes, Oralia Rud, DO [place] [facility]               Requested Prescriptions  Pending Prescriptions Disp Refills   testosterone cypionate (DEPOTESTOSTERONE CYPIONATE) 200 MG/ML injection [Pharmacy Med Name: TESTOSTERONE CYPIONATE 200 MG/ML IM] 1 mL     Sig: INJECT 0.375 MLS (75 MG TOTAL) INTRAMUSCULARLY EVERY 14 DAYS.     Off-Protocol Failed - 05/09/2023  8:43 AM      Failed - Medication not assigned to a protocol, review manually.      Passed - Valid encounter within last 12 months    Recent Outpatient Visits           2 months  ago Controlled type 2 diabetes mellitus with microalbuminuria, without long-term current use of insulin (HCC)   [place] [facility] [place], Megan P, DO   4 months ago Primary hypertension   [place] [facility] [place], Megan P, DO   5 months ago Primary hypertension   [place] [facility] Mecum, Oswaldo Conroy, PA-C   5 months ago [facility] [employer] preventive visit   [place] [facility] [employer], [place], DO   8 months ago Hypogonadism in male   [place] [facility] [place], [place], DO       Future Appointments             In 5 days Laural Benes, Oralia Rud, DO  [facility], [facility]

## 2023-05-14 ENCOUNTER — Encounter: Payer: Self-pay | Admitting: Family Medicine

## 2023-05-15 ENCOUNTER — Encounter: Payer: Self-pay | Admitting: Family Medicine

## 2023-05-15 ENCOUNTER — Ambulatory Visit: Payer: PPO | Admitting: Family Medicine

## 2023-05-15 VITALS — BP 144/82 | HR 56 | Temp 97.9°F | Wt 230.8 lb

## 2023-05-15 DIAGNOSIS — Z7985 Long-term (current) use of injectable non-insulin antidiabetic drugs: Secondary | ICD-10-CM

## 2023-05-15 DIAGNOSIS — Z7984 Long term (current) use of oral hypoglycemic drugs: Secondary | ICD-10-CM | POA: Diagnosis not present

## 2023-05-15 DIAGNOSIS — E1129 Type 2 diabetes mellitus with other diabetic kidney complication: Secondary | ICD-10-CM

## 2023-05-15 DIAGNOSIS — D692 Other nonthrombocytopenic purpura: Secondary | ICD-10-CM

## 2023-05-15 DIAGNOSIS — I700 Atherosclerosis of aorta: Secondary | ICD-10-CM | POA: Diagnosis not present

## 2023-05-15 DIAGNOSIS — J449 Chronic obstructive pulmonary disease, unspecified: Secondary | ICD-10-CM

## 2023-05-15 DIAGNOSIS — R809 Proteinuria, unspecified: Secondary | ICD-10-CM | POA: Diagnosis not present

## 2023-05-15 DIAGNOSIS — E291 Testicular hypofunction: Secondary | ICD-10-CM

## 2023-05-15 LAB — BAYER DCA HB A1C WAIVED: HB A1C (BAYER DCA - WAIVED): 5.3 % (ref 4.8–5.6)

## 2023-05-15 MED ORDER — HYDROCHLOROTHIAZIDE 12.5 MG PO CAPS: 12.5000 mg | ORAL_CAPSULE | Freq: Every day | ORAL | 1 refills | Status: DC

## 2023-05-15 MED ORDER — METOPROLOL SUCCINATE ER 100 MG PO TB24: 50.0000 mg | ORAL_TABLET | Freq: Every day | ORAL | 1 refills | Status: DC

## 2023-05-15 MED ORDER — BUPROPION HCL ER (SR) 200 MG PO TB12: 200.0000 mg | ORAL_TABLET | Freq: Two times a day (BID) | ORAL | 1 refills | Status: DC

## 2023-05-15 MED ORDER — TRAMADOL HCL 50 MG PO TABS: 50.0000 mg | ORAL_TABLET | Freq: Two times a day (BID) | ORAL | 2 refills | Status: DC

## 2023-05-15 MED ORDER — BENAZEPRIL HCL 40 MG PO TABS: 40.0000 mg | ORAL_TABLET | Freq: Every day | ORAL | 1 refills | Status: DC

## 2023-05-15 MED ORDER — METFORMIN HCL ER 500 MG PO TB24: 1000.0000 mg | ORAL_TABLET | Freq: Every day | ORAL | Status: DC

## 2023-05-15 NOTE — Progress Notes (Signed)
BP (!) 144/82   Pulse (!) 56   Temp 97.9 F (36.6 C) (Oral)   Wt 230 lb 12.8 oz (104.7 kg)   SpO2 97%   BMI 36.15 kg/m    Subjective:    Patient ID: Andrew [name], male    DOB: Apr 20, 1956, 67 y.o.   MRN: [id number]  HPI: Andrew [name] is a 67 y.o. male  Chief Complaint  Patient presents with   Diabetes   DIABETES Hypoglycemic episodes:no Polydipsia/polyuria: no Visual disturbance: no Chest pain: no Paresthesias: no Glucose Monitoring: no Taking Insulin?: no Blood Pressure Monitoring: rarely Retinal Examination: Up to Date Foot Exam: Up to Date Diabetic Education: Completed Pneumovax: Up to Date Influenza: Not up to Date Aspirin: yes  LOW TESTOSTERONE Duration: chronic Status: controlled  Satisfied with current treatment:  yes Medication side effects:  no Medication compliance: excellent compliance Decreased libido: no Fatigue: no Depressed mood: no Muscle weakness: no Erectile dysfunction: no  CHRONIC PAIN  Present dose: 10 Morphine equivalents Pain control status: controlled Duration: chronic Location: low back Quality: aching and sore Current Pain Level: mild Previous Pain Level: moderate Breakthrough pain: no Benefit from narcotic medications: yes What Activities task can be accomplished with current medication? Able to do his ADLs Interested in weaning off narcotics:no   Stool softners/OTC fiber: yes  Previous pain specialty evaluation: yes Non-narcotic analgesic meds: yes Narcotic contract: yes  Relevant past medical, surgical, family and social history reviewed and updated as indicated. Interim medical history since our last visit reviewed. Allergies and medications reviewed and updated.  Review of Systems  Constitutional: Negative.   Respiratory: Negative.    Cardiovascular: Negative.   Gastrointestinal: Negative.   Musculoskeletal:  Positive for arthralgias, back pain and myalgias. Negative for gait problem, joint swelling, neck  pain and neck stiffness.  Skin: Negative.   Neurological: Negative.   Psychiatric/Behavioral: Negative.      Per HPI unless specifically indicated above     Objective:    BP (!) 144/82   Pulse (!) 56   Temp 97.9 F (36.6 C) (Oral)   Wt 230 lb 12.8 oz (104.7 kg)   SpO2 97%   BMI 36.15 kg/m   Wt Readings from Last 3 Encounters:  05/15/23 230 lb 12.8 oz (104.7 kg)  02/12/23 232 lb 11.2 oz (105.6 kg)  01/04/23 242 lb 9.6 oz (110 kg)    Physical Exam Vitals and nursing note reviewed.  Constitutional:      General: He is not in acute distress.    Appearance: Normal appearance. He is not ill-appearing, toxic-appearing or diaphoretic.  HENT:     Head: Normocephalic and atraumatic.     Right Ear: External ear normal.     Left Ear: External ear normal.     Nose: Nose normal.     Mouth/Throat:     Mouth: Mucous membranes are moist.     Pharynx: Oropharynx is clear.  Eyes:     General: No scleral icterus.       Right eye: No discharge.        Left eye: No discharge.     Extraocular Movements: Extraocular movements intact.     Conjunctiva/sclera: Conjunctivae normal.     Pupils: Pupils are equal, round, and reactive to light.  Cardiovascular:     Rate and Rhythm: Normal rate and regular rhythm.     Pulses: Normal pulses.     Heart sounds: Normal heart sounds. No murmur heard.    No friction  rub. No gallop.  Pulmonary:     Effort: Pulmonary effort is normal. No respiratory distress.     Breath sounds: Normal breath sounds. No stridor. No wheezing, rhonchi or rales.  Chest:     Chest wall: No tenderness.  Musculoskeletal:        General: Normal range of motion.     Cervical back: Normal range of motion and neck supple.  Skin:    General: Skin is warm and dry.     Capillary Refill: Capillary refill takes less than 2 seconds.     Coloration: Skin is not jaundiced or pale.     Findings: No bruising, erythema, lesion or rash.  Neurological:     General: No focal deficit  present.     Mental Status: He is alert and oriented to person, place, and time. Mental status is at baseline.  Psychiatric:        Mood and Affect: Mood normal.        Behavior: Behavior normal.        Thought Content: Thought content normal.        Judgment: Judgment normal.     Results for orders placed or performed in visit on 05/15/23  Testosterone, free, total(Labcorp/Sunquest)  Result Value Ref Range   Testosterone 362 264 - 916 ng/dL   Testosterone, Free 6.0 (L) 6.6 - 18.1 pg/mL   Sex Hormone Binding 31.4 19.3 - 76.4 nmol/L  Bayer DCA Hb A1c Waived  Result Value Ref Range   HB A1C (BAYER DCA - WAIVED) 5.3 4.8 - 5.6 %      Assessment & Plan:   Problem List Items Addressed This Visit       Cardiovascular and Mediastinum   Aortic atherosclerosis (HCC)    Will keep BP, sugar and cholesterol under good control. Continue to monitor. Call with any concerns.       Relevant Medications   benazepril (LOTENSIN) 40 MG tablet   hydrochlorothiazide (MICROZIDE) 12.5 MG capsule   metoprolol succinate (TOPROL-XL) 100 MG 24 hr tablet     Respiratory   COPD (chronic obstructive pulmonary disease) (HCC)    Stable. Continue to monitor. Call with any concerns.         Endocrine   Hypogonadism in male    Under good control on current regimen. Continue current regimen. Continue to monitor. Call with any concerns. Labs drawn today.        Controlled type 2 diabetes mellitus with microalbuminuria (HCC) - Primary    Doing great with A1c of 5.3 down from 6.3- will cut his metformin down to 1000mg  a day and continue ozempic. Call with any concerns. Recheck 3 months.       Relevant Medications   metFORMIN (GLUCOPHAGE-XR) 500 MG 24 hr tablet   benazepril (LOTENSIN) 40 MG tablet   Other Relevant Orders   Bayer DCA Hb A1c Waived (Completed)   Other Visit Diagnoses     Other nonthrombocytopenic purpura (HCC)   (Chronic)     Stable. Continue to monitor.        Follow up  plan: Return in about 3 months (around 08/15/2023).

## 2023-05-15 NOTE — Assessment & Plan Note (Signed)
Doing great with A1c of 5.3 down from 6.3- will cut his metformin down to 1000mg  a day and continue ozempic. Call with any concerns. Recheck 3 months.

## 2023-05-17 LAB — TESTOSTERONE, FREE, TOTAL, SHBG
Sex Hormone Binding: 31.4 nmol/L (ref 19.3–76.4)
Testosterone, Free: 6 pg/mL — ABNORMAL LOW (ref 6.6–18.1)
Testosterone: 362 ng/dL (ref 264–916)

## 2023-05-19 ENCOUNTER — Encounter: Payer: Self-pay | Admitting: Family Medicine

## 2023-05-19 NOTE — Assessment & Plan Note (Signed)
Will keep BP, sugar and cholesterol under good control. Continue to monitor. Call with any concerns.  

## 2023-05-19 NOTE — Assessment & Plan Note (Signed)
Under good control on current regimen. Continue current regimen. Continue to monitor. Call with any concerns. Labs drawn today.  

## 2023-05-19 NOTE — Assessment & Plan Note (Signed)
Stable. Continue to monitor. Call with any concerns.  ?

## 2023-05-23 ENCOUNTER — Other Ambulatory Visit: Payer: Self-pay | Admitting: Family Medicine

## 2023-05-24 NOTE — Telephone Encounter (Signed)
Requested medication (s) are due for refill today:   Provider to review  Requested medication (s) are on the active medication list:   Yes  Future visit scheduled:   Yes   Last ordered: 02/18/2023 1.5 ml, 5 refills  Non delegated refill.   This is a duplicate request.      Requested Prescriptions  Pending Prescriptions Disp Refills   testosterone cypionate (DEPOTESTOSTERONE CYPIONATE) 200 MG/ML injection [Pharmacy Med Name: TESTOSTERONE CYPIONATE 200 MG/ML IM] 1 mL     Sig: INJECT 0.375 MLS (75 MG TOTAL) INTRAMUSCULARLY EVERY 14 DAYS.     Off-Protocol Failed - 05/23/2023  2:03 PM      Failed - Medication not assigned to a protocol, review manually.      Passed - Valid encounter within last 12 months    Recent Outpatient Visits           1 week ago Controlled type 2 diabetes mellitus with microalbuminuria, without long-term current use of insulin (HCC)   [facility] [place], Megan P, DO   3 months ago Controlled type 2 diabetes mellitus with microalbuminuria, without long-term current use of insulin (HCC)   [place] [facility] [place], Megan P, DO   4 months ago Primary hypertension   [facility] [place], [place], DO   5 months ago Primary hypertension   [place] [facility] Mecum, Oswaldo Conroy, PA-C   6 months ago [employer] [facility] [employer] preventive visit   [facility] [place], [place], DO       Future Appointments             In 3 months Laural Benes, Oralia Rud, DO [facility] [facility]

## 2023-06-18 ENCOUNTER — Other Ambulatory Visit: Payer: Self-pay | Admitting: Family Medicine

## 2023-06-19 NOTE — Telephone Encounter (Signed)
Copied from CRM [id number]. Topic: General - Other >> Jun 18, 2023  4:09 PM Dondra Prader E wrote: Reason for CRM: Pt called to report that he is having difficulty receiving his Testosterone. He says he contacted [facility] and they told him that he has used all of his refills. He wants to speak to the clinic to have this resolved because he is experiencing gaps in his treatment.   Best contact:  [phone]

## 2023-06-20 NOTE — Telephone Encounter (Signed)
6 months sent in in May. Should not be due until November. Can we please call the pharmacy and find out what's going on?

## 2023-06-20 NOTE — Telephone Encounter (Signed)
Requested medication (s) are due for refill today: Yes  Requested medication (s) are on the active medication list: Yes  Last refill:  05/09/23  Future visit scheduled: Yes  Notes to clinic:  Manual review.    Requested Prescriptions  Pending Prescriptions Disp Refills   testosterone cypionate (DEPOTESTOSTERONE CYPIONATE) 200 MG/ML injection [Pharmacy Med Name: TESTOSTERONE CYPIONATE 200 MG/ML IM] 1 mL     Sig: INJECT 0.375 MLS (75 MG TOTAL) INTRAMUSCULARLY EVERY 14 DAYS.     Off-Protocol Failed - 06/19/2023  8:10 AM      Failed - Medication not assigned to a protocol, review manually.      Passed - Valid encounter within last 12 months    Recent Outpatient Visits           1 month ago Controlled type 2 diabetes mellitus with microalbuminuria, without long-term current use of insulin (HCC)   [place] [facility] [place], Megan P, DO   4 months ago Controlled type 2 diabetes mellitus with microalbuminuria, without long-term current use of insulin (HCC)   [facility] [place], Megan P, DO   5 months ago Primary hypertension   [facility] [place], Megan P, DO   6 months ago Primary hypertension   [place] [facility] Mecum, Oswaldo Conroy, PA-C   7 months ago [facility] to [employer] preventive visit   [place] [facility] [place], [place], DO       Future Appointments             In 2 months Laural Benes, Oralia Rud, DO  [facility], [facility]

## 2023-06-25 MED ORDER — TESTOSTERONE CYPIONATE 100 MG/ML IM SOLN: 75.0000 mg | INTRAMUSCULAR | 5 refills | Status: DC

## 2023-06-25 NOTE — Addendum Note (Signed)
Addended by: Dorcas Carrow on: 06/25/2023 04:30 PM   Modules accepted: Orders

## 2023-06-25 NOTE — Telephone Encounter (Signed)
Called to speak with pharmacy & pharmacist stated that pt is getting 1ml and each refill and his refills are done every 14 days. So the patient no longer has any refills on file

## 2023-08-26 ENCOUNTER — Encounter: Payer: Self-pay | Admitting: Family Medicine

## 2023-08-26 ENCOUNTER — Ambulatory Visit (INDEPENDENT_AMBULATORY_CARE_PROVIDER_SITE_OTHER): Payer: PPO | Admitting: Family Medicine

## 2023-08-26 VITALS — BP 111/70

## 2023-08-26 DIAGNOSIS — R809 Proteinuria, unspecified: Secondary | ICD-10-CM | POA: Diagnosis not present

## 2023-08-26 DIAGNOSIS — E1129 Type 2 diabetes mellitus with other diabetic kidney complication: Secondary | ICD-10-CM

## 2023-08-26 DIAGNOSIS — Z7985 Long-term (current) use of injectable non-insulin antidiabetic drugs: Secondary | ICD-10-CM | POA: Diagnosis not present

## 2023-08-26 DIAGNOSIS — E291 Testicular hypofunction: Secondary | ICD-10-CM

## 2023-08-26 DIAGNOSIS — Z23 Encounter for immunization: Secondary | ICD-10-CM

## 2023-08-26 DIAGNOSIS — G8929 Other chronic pain: Secondary | ICD-10-CM | POA: Diagnosis not present

## 2023-08-26 DIAGNOSIS — I700 Atherosclerosis of aorta: Secondary | ICD-10-CM

## 2023-08-26 DIAGNOSIS — I83813 Varicose veins of bilateral lower extremities with pain: Secondary | ICD-10-CM | POA: Diagnosis not present

## 2023-08-26 DIAGNOSIS — M545 Low back pain, unspecified: Secondary | ICD-10-CM

## 2023-08-26 LAB — BAYER DCA HB A1C WAIVED: HB A1C (BAYER DCA - WAIVED): 5.6 % (ref 4.8–5.6)

## 2023-08-26 MED ORDER — TRAMADOL HCL 50 MG PO TABS: 50.0000 mg | ORAL_TABLET | Freq: Two times a day (BID) | ORAL | 2 refills | Status: DC

## 2023-08-26 MED ORDER — METOPROLOL SUCCINATE ER 100 MG PO TB24: 100.0000 mg | ORAL_TABLET | Freq: Every day | ORAL | 1 refills | Status: DC

## 2023-08-26 MED ORDER — ROSUVASTATIN CALCIUM 5 MG PO TABS: 5.0000 mg | ORAL_TABLET | ORAL | 0 refills | Status: DC

## 2023-08-26 MED ORDER — OZEMPIC (0.25 OR 0.5 MG/DOSE) 2 MG/3ML ~~LOC~~ SOPN: 0.5000 mg | PEN_INJECTOR | SUBCUTANEOUS | 1 refills | Status: DC

## 2023-08-26 MED ORDER — BENAZEPRIL HCL 40 MG PO TABS: 40.0000 mg | ORAL_TABLET | Freq: Every day | ORAL | 1 refills | Status: DC

## 2023-08-26 MED ORDER — HYDROCHLOROTHIAZIDE 12.5 MG PO CAPS: 12.5000 mg | ORAL_CAPSULE | Freq: Every day | ORAL | 1 refills | Status: DC

## 2023-08-26 MED ORDER — TADALAFIL 5 MG PO TABS: ORAL_TABLET | ORAL | 1 refills | Status: DC

## 2023-08-26 NOTE — Assessment & Plan Note (Signed)
Referral to vascular placed today. Await their input.

## 2023-08-26 NOTE — Assessment & Plan Note (Signed)
Doing great with A1c of 5.6. Will stop his metformin and increase his ozempic to 0.5mg . Recheck 3 motnhs. Call with any concerns.

## 2023-08-26 NOTE — Assessment & Plan Note (Signed)
Under good control on current regimen. Continue current regimen. Continue to monitor. Call with any concerns. Refills to be given based on labs. Labs drawn today.

## 2023-08-26 NOTE — Assessment & Plan Note (Signed)
Under good control on current regimen. Continue current regimen. Continue to monitor. Call with any concerns. Refills given for 3 months. Follow up 3 months.    

## 2023-08-26 NOTE — Assessment & Plan Note (Signed)
Has issues taking his statin. Will try weekly 5mg  crestor. Call with any concerns.

## 2023-08-26 NOTE — Progress Notes (Signed)
BP 111/70    Subjective:    Patient ID: Andrew [name], male    DOB: 10-20-1955, 67 y.o.   MRN: [id number]  HPI: Andrew [name] is a 67 y.o. male  Chief Complaint  Patient presents with   Diabetes   COPD   DIABETES Hypoglycemic episodes:no Polydipsia/polyuria: no Visual disturbance: no Chest pain: no Paresthesias: no Glucose Monitoring: yes  Accucheck frequency: Daily Taking Insulin?: no Blood Pressure Monitoring: not checking Retinal Examination: Up to Date Foot Exam: Up to Date Diabetic Education: Completed Pneumovax: Up to Date Influenza: Up to Date Aspirin: yes  CHRONIC PAIN  Present dose: 10 Morphine equivalents Pain control status: controlled Duration: chronic Location: low back Quality: aching and shooting Current Pain Level: mild Previous Pain Level: moderate Breakthrough pain: no Benefit from narcotic medications: yes What Activities task can be accomplished with current medication? Able to do his ADLs Interested in weaning off narcotics:yes   Stool softners/OTC fiber: yes  Previous pain specialty evaluation: yes Non-narcotic analgesic meds: yes Narcotic contract: yes  LOW TESTOSTERONE Duration: chronic Status: controlled  Satisfied with current treatment:  yes Previous testosterone therapies:androgel, injectable Medication side effects:  no Medication compliance: excellent compliance Decreased libido: yes Fatigue: yes Depressed mood: no Muscle weakness: no Erectile dysfunction: yes  HYPERTENSION / HYPERLIPIDEMIA Satisfied with current treatment? yes Duration of hypertension: chronic BP monitoring frequency: not checking BP medication side effects: no Past BP meds: hydrochlorothiazide, metoprolol, benazepril Duration of hyperlipidemia: chronic Cholesterol medication side effects: yes- myalgias Cholesterol supplements: none Past cholesterol medications: atorvastatin, crestor Medication compliance: fair compliance Aspirin: yes Recent  stressors: no Recurrent headaches: no Visual changes: no Palpitations: no Dyspnea: no Chest pain: no Lower extremity edema: no Dizzy/lightheaded: no  Relevant past medical, surgical, family and social history reviewed and updated as indicated. Interim medical history since our last visit reviewed. Allergies and medications reviewed and updated.  Review of Systems  Constitutional: Negative.   Respiratory: Negative.    Cardiovascular: Negative.   Gastrointestinal: Negative.   Musculoskeletal:  Positive for arthralgias and back pain. Negative for gait problem, joint swelling, myalgias, neck pain and neck stiffness.  Skin: Negative.   Neurological: Negative.   Psychiatric/Behavioral: Negative.      Per HPI unless specifically indicated above     Objective:    BP 111/70   Wt Readings from Last 3 Encounters:  05/15/23 230 lb 12.8 oz (104.7 kg)  02/12/23 232 lb 11.2 oz (105.6 kg)  01/04/23 242 lb 9.6 oz (110 kg)    Physical Exam Vitals and nursing note reviewed.  Constitutional:      General: He is not in acute distress.    Appearance: Normal appearance. He is obese. He is not ill-appearing, toxic-appearing or diaphoretic.  HENT:     Head: Normocephalic and atraumatic.     Right Ear: External ear normal.     Left Ear: External ear normal.     Nose: Nose normal.     Mouth/Throat:     Mouth: Mucous membranes are moist.     Pharynx: Oropharynx is clear.  Eyes:     General: No scleral icterus.       Right eye: No discharge.        Left eye: No discharge.     Extraocular Movements: Extraocular movements intact.     Conjunctiva/sclera: Conjunctivae normal.     Pupils: Pupils are equal, round, and reactive to light.  Cardiovascular:     Rate and Rhythm: Normal rate and regular  rhythm.     Pulses: Normal pulses.     Heart sounds: Normal heart sounds. No murmur heard.    No friction rub. No gallop.  Pulmonary:     Effort: Pulmonary effort is normal. No respiratory  distress.     Breath sounds: Normal breath sounds. No stridor. No wheezing, rhonchi or rales.  Chest:     Chest wall: No tenderness.  Musculoskeletal:        General: Normal range of motion.     Cervical back: Normal range of motion and neck supple.  Skin:    General: Skin is warm and dry.     Capillary Refill: Capillary refill takes less than 2 seconds.     Coloration: Skin is not jaundiced or pale.     Findings: No bruising, erythema, lesion or rash.  Neurological:     General: No focal deficit present.     Mental Status: He is alert and oriented to person, place, and time. Mental status is at baseline.  Psychiatric:        Mood and Affect: Mood normal.        Behavior: Behavior normal.        Thought Content: Thought content normal.        Judgment: Judgment normal.     Results for orders placed or performed in visit on 08/26/23  Bayer DCA Hb A1c Waived  Result Value Ref Range   HB A1C (BAYER DCA - WAIVED) 5.6 4.8 - 5.6 %      Assessment & Plan:   Problem List Items Addressed This Visit       Cardiovascular and Mediastinum   Varicose veins of both lower extremities with pain    Referral to vascular placed today. Await their input.       Relevant Medications   rosuvastatin (CRESTOR) 5 MG tablet   benazepril (LOTENSIN) 40 MG tablet   hydrochlorothiazide (MICROZIDE) 12.5 MG capsule   metoprolol succinate (TOPROL-XL) 100 MG 24 hr tablet   tadalafil (CIALIS) 5 MG tablet   Other Relevant Orders   Ambulatory referral to Vascular Surgery   Aortic atherosclerosis (HCC)    Has issues taking his statin. Will try weekly 5mg  crestor. Call with any concerns.       Relevant Medications   rosuvastatin (CRESTOR) 5 MG tablet   benazepril (LOTENSIN) 40 MG tablet   hydrochlorothiazide (MICROZIDE) 12.5 MG capsule   metoprolol succinate (TOPROL-XL) 100 MG 24 hr tablet   tadalafil (CIALIS) 5 MG tablet     Endocrine   Hypogonadism in male    Under good control on current  regimen. Continue current regimen. Continue to monitor. Call with any concerns. Refills to be given based on labs. Labs drawn today.       Relevant Orders   Testosterone, free, total([facility])   Controlled type 2 diabetes mellitus with microalbuminuria (HCC) - Primary    Doing great with A1c of 5.6. Will stop his metformin and increase his ozempic to 0.5mg . Recheck 3 motnhs. Call with any concerns.       Relevant Medications   rosuvastatin (CRESTOR) 5 MG tablet   benazepril (LOTENSIN) 40 MG tablet   Semaglutide,0.25 or 0.5MG /DOS, (OZEMPIC, 0.25 OR 0.5 MG/DOSE,) 2 MG/3ML SOPN   Other Relevant Orders   Bayer DCA Hb A1c Waived (Completed)   CBC with Differential/Platelet   Comprehensive metabolic panel   Lipid Panel w/o Chol/HDL Ratio     Other   Chronic low back pain  Under good control on current regimen. Continue current regimen. Continue to monitor. Call with any concerns. Refills given for 3 months. Follow up 3 months.        Relevant Medications   traMADol (ULTRAM) 50 MG tablet   Other Visit Diagnoses     Needs flu shot       Flu shot given today.   Relevant Orders   Flu Vaccine Trivalent High Dose (Fluad) (Completed)        Follow up plan: Return in about 3 months (around 11/26/2023).

## 2023-08-27 ENCOUNTER — Telehealth: Payer: Self-pay | Admitting: Family Medicine

## 2023-08-27 NOTE — Telephone Encounter (Signed)
Per pharmacy- the only way they can honor the Rx as written is #[id number]- gave ok to fill for patient per Rx and visit instruction  Per chart note: Has issues taking his statin. Will try weekly 5mg  crestor. Call with any concerns.

## 2023-08-27 NOTE — Telephone Encounter (Signed)
Cicero Duck with [employer] called asking about the Crestor instructions.  If it is once a week and 52 quantity  CB@  [phone]

## 2023-08-28 ENCOUNTER — Other Ambulatory Visit: Payer: Self-pay | Admitting: Family Medicine

## 2023-08-28 DIAGNOSIS — N289 Disorder of kidney and ureter, unspecified: Secondary | ICD-10-CM

## 2023-08-28 MED ORDER — TESTOSTERONE CYPIONATE 100 MG/ML IM SOLN: 100.0000 mg | INTRAMUSCULAR | 5 refills | Status: DC

## 2023-08-29 LAB — CBC WITH DIFFERENTIAL/PLATELET
Basophils Absolute: 0 10*3/uL (ref 0.0–0.2)
Basos: 1 %
EOS (ABSOLUTE): 0.2 10*3/uL (ref 0.0–0.4)
Eos: 4 %
Hematocrit: 40.5 % (ref 37.5–51.0)
Hemoglobin: 13.4 g/dL (ref 13.0–17.7)
Immature Grans (Abs): 0 10*3/uL (ref 0.0–0.1)
Immature Granulocytes: 0 %
Lymphocytes Absolute: 1.4 10*3/uL (ref 0.7–3.1)
Lymphs: 26 %
MCH: 31.8 pg (ref 26.6–33.0)
MCHC: 33.1 g/dL (ref 31.5–35.7)
MCV: 96 fL (ref 79–97)
Monocytes Absolute: 0.5 10*3/uL (ref 0.1–0.9)
Monocytes: 10 %
Neutrophils Absolute: 3.1 10*3/uL (ref 1.4–7.0)
Neutrophils: 59 %
Platelets: 160 10*3/uL (ref 150–450)
RBC: 4.22 x10E6/uL (ref 4.14–5.80)
RDW: 13.2 % (ref 11.6–15.4)
WBC: 5.3 10*3/uL (ref 3.4–10.8)

## 2023-08-29 LAB — LIPID PANEL W/O CHOL/HDL RATIO
Cholesterol, Total: 193 mg/dL (ref 100–199)
HDL: 30 mg/dL — ABNORMAL LOW (ref >39)
LDL Chol Calc (NIH): 116 mg/dL — ABNORMAL HIGH (ref 0–99)
Triglycerides: 268 mg/dL — ABNORMAL HIGH (ref 0–149)
VLDL Cholesterol Cal: 47 mg/dL — ABNORMAL HIGH (ref 5–40)

## 2023-08-29 LAB — COMPREHENSIVE METABOLIC PANEL
ALT: 27 [IU]/L (ref 0–44)
AST: 21 [IU]/L (ref 0–40)
Albumin: 4.3 g/dL (ref 3.9–4.9)
Alkaline Phosphatase: 100 [IU]/L (ref 44–121)
BUN/Creatinine Ratio: 22 (ref 10–24)
BUN: 32 mg/dL — ABNORMAL HIGH (ref 8–27)
Bilirubin Total: 0.6 mg/dL (ref 0.0–1.2)
CO2: 23 mmol/L (ref 20–29)
Calcium: 9.2 mg/dL (ref 8.6–10.2)
Chloride: 104 mmol/L (ref 96–106)
Creatinine, Ser: 1.45 mg/dL — ABNORMAL HIGH (ref 0.76–1.27)
Globulin, Total: 1.8 g/dL (ref 1.5–4.5)
Glucose: 124 mg/dL — ABNORMAL HIGH (ref 70–99)
Potassium: 4.7 mmol/L (ref 3.5–5.2)
Sodium: 140 mmol/L (ref 134–144)
Total Protein: 6.1 g/dL (ref 6.0–8.5)
eGFR: 53 mL/min/{1.73_m2} — ABNORMAL LOW (ref >59)

## 2023-08-29 LAB — TESTOSTERONE, FREE, TOTAL, SHBG
Sex Hormone Binding: 32.1 nmol/L (ref 19.3–76.4)
Testosterone, Free: 2.7 pg/mL — ABNORMAL LOW (ref 6.6–18.1)
Testosterone: 126 ng/dL — ABNORMAL LOW (ref 264–916)

## 2023-09-02 NOTE — Progress Notes (Signed)
Called and scheduled appointment with patient on 09/16/2023 @ 8:40 am.

## 2023-09-16 ENCOUNTER — Other Ambulatory Visit: Payer: PPO

## 2023-09-20 ENCOUNTER — Other Ambulatory Visit: Payer: PPO

## 2023-09-20 DIAGNOSIS — N289 Disorder of kidney and ureter, unspecified: Secondary | ICD-10-CM | POA: Diagnosis not present

## 2023-09-21 LAB — BASIC METABOLIC PANEL
BUN/Creatinine Ratio: 21 (ref 10–24)
BUN: 29 mg/dL — ABNORMAL HIGH (ref 8–27)
CO2: 24 mmol/L (ref 20–29)
Calcium: 10.4 mg/dL — ABNORMAL HIGH (ref 8.6–10.2)
Chloride: 101 mmol/L (ref 96–106)
Creatinine, Ser: 1.39 mg/dL — ABNORMAL HIGH (ref 0.76–1.27)
Glucose: 153 mg/dL — ABNORMAL HIGH (ref 70–99)
Potassium: 4.4 mmol/L (ref 3.5–5.2)
Sodium: 139 mmol/L (ref 134–144)
eGFR: 56 mL/min/{1.73_m2} — ABNORMAL LOW (ref >59)

## 2023-10-07 ENCOUNTER — Other Ambulatory Visit: Payer: Self-pay

## 2023-10-07 DIAGNOSIS — I83813 Varicose veins of bilateral lower extremities with pain: Secondary | ICD-10-CM

## 2023-10-15 NOTE — Progress Notes (Signed)
Office Note     CC:  Varicose veins Requesting Provider:  Dorcas Carrow, DO  HPI: Andrew [name] is a 68 y.o. (07-08-1956) male who presents at the request of Andrew Carrow, DO for evaluation of bilateral lower extremity varicose veins.  On exam, 22 was doing well, accompanied by his [name].  [place], he continues to live in the area.  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 In retirement, Andrew [name] have enjoyed traveling.  They go to [place] on a yearly basis, and recently bought a Electronics engineer.  Andrew [name] has a history of bilateral knee replacement, and bilateral venous ablation, stab phlebectomies.  These occurred in [place] around 2015.  He is unsure as to what was ablated, but notes a laser was utilized.  He presents today with recent episode of swelling and visible varicosities.  The swelling occurred after walking on the beach several months ago, and resolved.  He denies pain or ulceration of the varicosities.  Denies bleeding.  Andrew [name] denies aching, heavy, tired feeling in the legs   Past Medical History:  Diagnosis Date   Chronic pain syndrome    Depression    Diverticulosis    DVT (deep venous thrombosis) (HCC)    bilateral, was on anticoags for 3months   Ear infection 08/2016   CLEARING UP-FINISHED ANTIBIOTIC AND PREDNISONE TAPER    Family history of adverse reaction to anesthesia    BROTHER-N/V   Hernia, umbilical    History of kidney stones    Hyperlipidemia    Hypertension    IFG (impaired fasting glucose)    Knee pain, chronic    Pes anserine bursitis     Past Surgical History:  Procedure Laterality Date   COLONOSCOPY  2015   JOINT REPLACEMENT Bilateral 2009   replacement and revision knee   KIDNEY STONE SURGERY     SHOULDER ARTHROSCOPY WITH ROTATOR CUFF REPAIR Right    UMBILICAL HERNIA REPAIR N/A 09/06/2016   Procedure: HERNIA REPAIR UMBILICAL ADULT;  Surgeon: Earline Mayotte, MD;  Location: [facility]  ORS;  Service: General;  Laterality: N/A;    Social History   Socioeconomic History   Marital status: Married    Spouse name: Not on file   Number of children: 2   Years of education: Not on file   Highest education level: Not on file  Occupational History   Not on file  Tobacco Use   Smoking status: Former    Current packs/day: 0.00    Average packs/day: 1.5 packs/day for 25.0 years (37.5 ttl pk-yrs)    Types: Cigarettes    Start date: 06/27/1986    Quit date: 06/28/2011    Years since quitting: 12.3   Smokeless tobacco: Never  Vaping Use   Vaping status: Never Used  Substance and Sexual Activity   Alcohol use: Yes    Comment: on occasion   Drug use: No   Sexual activity: Yes    Birth control/protection: None  Other Topics Concern   Not on file  Social History Narrative   Not on file   Social Drivers of Health   Financial Resource Strain: Patient Declined (02/11/2023)   Overall Financial Resource Strain (CARDIA)    Difficulty of Paying Living Expenses: Patient declined  Food Insecurity: Patient Declined (02/11/2023)   Hunger Vital Sign    Worried About Running Out of Food in the Last Year: Patient declined    Ran Out  of Food in the Last Year: Patient declined  Transportation Needs: Patient Declined (02/11/2023)   PRAPARE - Administrator, Civil Service (Medical): Patient declined    Lack of Transportation (Non-Medical): Patient declined  Physical Activity: Unknown (02/11/2023)   Exercise Vital Sign    Days of Exercise per Week: Patient declined    Minutes of Exercise per Session: Not on file  Stress: Patient Declined (02/11/2023)   [employer] of Occupational Health - Occupational Stress Questionnaire    Feeling of Stress : Patient declined  Social Connections: Unknown (02/11/2023)   Social Connection and Isolation Panel [NHANES]    Frequency of Communication with Friends and Family: Patient declined    Frequency of Social Gatherings with Friends  and Family: Patient declined    Attends Religious Services: Patient declined    Database administrator or Organizations: Patient declined    Attends Banker Meetings: Not on file    Marital Status: Patient declined  Intimate Partner Violence: Not At Risk (06/12/2022)   Humiliation, Afraid, Rape, and Kick questionnaire    Fear of Current or Ex-Partner: No    Emotionally Abused: No    Physically Abused: No    Sexually Abused: No   Family History  Problem Relation Age of Onset   Pneumonia Mother 5   Alcohol abuse Father    Hypertension Father    Cirrhosis Father    Hypertension Brother    Stroke Paternal Grandmother    Stroke Paternal Grandfather     Current Outpatient Medications  Medication Sig Dispense Refill   aspirin EC 81 MG tablet Take 1 tablet (81 mg total) by mouth daily. Swallow whole. 90 tablet 3   benazepril (LOTENSIN) 40 MG tablet Take 1 tablet (40 mg total) by mouth daily. 90 tablet 1   Blood Glucose Monitoring Suppl (ONETOUCH VERIO) w/Device KIT 1 kit by Does not apply route daily. 1 kit 0   Blood Pressure Monitoring (ADULT BLOOD PRESSURE CUFF LG) KIT 1 each by Does not apply route daily. 1 kit 0   buPROPion (WELLBUTRIN SR) 200 MG 12 hr tablet Take 1 tablet (200 mg total) by mouth 2 (two) times daily. 180 tablet 1   diclofenac (VOLTAREN) 75 MG EC tablet Take 1 tablet (75 mg total) by mouth 2 (two) times daily. 180 tablet 3   glucose blood test strip Use as instructed 100 each 12   hydrochlorothiazide (MICROZIDE) 12.5 MG capsule Take 1 capsule (12.5 mg total) by mouth daily. 90 capsule 1   metoprolol succinate (TOPROL-XL) 100 MG 24 hr tablet Take 1 tablet (100 mg total) by mouth daily. 90 tablet 1   omeprazole (PRILOSEC) 20 MG capsule Take 20 mg by mouth daily.     OneTouch Delica Lancets 33G MISC 1 each by Does not apply route in the morning and at bedtime. 100 each 5   rosuvastatin (CRESTOR) 5 MG tablet Take 1 tablet (5 mg total) by mouth once a week. 52  tablet 0   Semaglutide,0.25 or 0.5MG /DOS, (OZEMPIC, 0.25 OR 0.5 MG/DOSE,) 2 MG/3ML SOPN Inject 0.5 mg into the skin once a week. 9 mL 1   tadalafil (CIALIS) 5 MG tablet TAKE 1 TABLET(5 MG) BY MOUTH DAILY 90 tablet 1   testosterone cypionate (DEPOTESTOTERONE CYPIONATE) 100 MG/ML injection Inject 1 mL (100 mg total) into the muscle every 14 (fourteen) days. For IM use only 1.5 mL 5   traMADol (ULTRAM) 50 MG tablet Take 1 tablet (50 mg total) by  mouth 2 (two) times daily. 60 tablet 2   No current facility-administered medications for this visit.    Allergies  Allergen Reactions   Other Other (See Comments)    Adhesive tape gave blisters   Flomax [Tamsulosin] Other (See Comments)    Sexual dysfunction   Statins Other (See Comments)    Muscle weakness, dizziness and fatigue     REVIEW OF SYSTEMS:  [X]  denotes positive finding, [ ]  denotes negative finding Cardiac  Comments:  Chest pain or chest pressure:    Shortness of breath upon exertion:    Short of breath when lying flat:    Irregular heart rhythm:        Vascular    Pain in calf, thigh, or hip brought on by ambulation:    Pain in feet at night that wakes you up from your sleep:     Blood clot in your veins:    Leg swelling:         Pulmonary    Oxygen at home:    Productive cough:     Wheezing:         Neurologic    Sudden weakness in arms or legs:     Sudden numbness in arms or legs:     Sudden onset of difficulty speaking or slurred speech:    Temporary loss of vision in one eye:     Problems with dizziness:         Gastrointestinal    Blood in stool:     Vomited blood:         Genitourinary    Burning when urinating:     Blood in urine:        Psychiatric    Major depression:         Hematologic    Bleeding problems:    Problems with blood clotting too easily:        Skin    Rashes or ulcers:        Constitutional    Fever or chills:      PHYSICAL EXAMINATION:  There were no vitals filed for  this visit.  General:  WDWN in NAD; vital signs documented above Gait: Not observed HENT: WNL, normocephalic Pulmonary: normal non-labored breathing , without Rales, rhonchi,  wheezing Cardiac: regular HR,  Abdomen: soft, NT, no masses Skin: without rashes Vascular Exam/Pulses:  Right Left  Radial 2+ (normal) 2+ (normal)  Ulnar    Femoral    Popliteal    DP 2+   PT  2+    Extremities: without ischemic changes, without Gangrene , without cellulitis; without open wounds;  Musculoskeletal: no muscle wasting or atrophy  Neurologic: A&O X 3;  No focal weakness or paresthesias are detected Psychiatric:  The pt has Normal affect.   Non-Invasive Vascular Imaging:   enous Reflux Times  +--------------+--------+------+----------+------------+-------------------  ----+  RIGHT        Reflux  Reflux  Reflux  Diameter cmsComments                                No       Yes     Time                                         +--------------+--------+------+----------+------------+-------------------  ----+  CFV  yes  >1 second                                       +--------------+--------+------+----------+------------+-------------------  ----+  FV mid        no                                                            +--------------+--------+------+----------+------------+-------------------  ----+  Popliteal    no                                                            +--------------+--------+------+----------+------------+-------------------  ----+  GSV at Navos             yes   >500 ms      0.94                              +--------------+--------+------+----------+------------+-------------------  ----+  GSV prox thigh         yes   >500 ms      0.70                              +--------------+--------+------+----------+------------+-------------------  ----+  GSV mid thigh          yes   >500 ms       0.29                              +--------------+--------+------+----------+------------+-------------------  ----+  GSV dist thigh         yes   >500 ms      0.29                              +--------------+--------+------+----------+------------+-------------------  ----+  GSV at knee   no                          0.36                              +--------------+--------+------+----------+------------+-------------------  ----+  SSV Pop Fossa                                     prior                                                                       ablation/stripping        +--------------+--------+------+----------+------------+-------------------  ----+  SSV prox calf                                     prior                                                                       ablation/stripping        +--------------+--------+------+----------+------------+-------------------  ----+     Summary:  Right:  - No evidence of deep vein thrombosis from the common femoral through the  popliteal veins.  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  - The common femoral vein is not competent.  - The great saphenous vein is not competent.  - The small saphenous vein was not identified and consistent with prior  stripping/ablation.        ASSESSMENT/PLAN:: 68 y.o. male presenting with asymptomatic varicose veins.  On physical exam, Andrew [name] had significant varicose veins affecting bilateral lower extremities.  These extend from the feet to the proximal portion of the thighs.  There were no ulcerations, no significant skin thinning, no concern for bleeding.  There is no pain to palpation.  Right lower extremity venous duplex ultrasound revealed previously ablated SSV, patent greater saphenous vein with reflux.  I had a long conversation with Andrew [name] regarding the above.   Although the greater saphenous vein could be ablated due to reflux, he is asymptomatic, therefore I do not think he would have significant benefit from this.  Similarly, regarding his varicose veins, he is asymptomatic.  Therefore, I do not think he would benefit from stab phlebectomy.  We discussed wearing compression stockings on a daily basis, and elevating when possible.  I asked him to call should new symptoms arise as I am happy to see him and take care of him.   Victorino Sparrow, MD [facility] [phone] Total time of patient care including pre-visit research, consultation, and documentation greater than 30 minutes

## 2023-10-17 ENCOUNTER — Ambulatory Visit (HOSPITAL_COMMUNITY)
Admission: RE | Admit: 2023-10-17 | Discharge: 2023-10-17 | Disposition: A | Discharge status: Home / Self Care | Payer: PPO | Source: Ambulatory Visit | Attending: Vascular Surgery | Admitting: Vascular Surgery

## 2023-10-17 ENCOUNTER — Ambulatory Visit (INDEPENDENT_AMBULATORY_CARE_PROVIDER_SITE_OTHER): Payer: PPO | Admitting: Vascular Surgery

## 2023-10-17 ENCOUNTER — Encounter: Payer: Self-pay | Admitting: Vascular Surgery

## 2023-10-17 VITALS — BP 116/74 | HR 59 | Temp 98.2°F | Resp 20 | Ht 67.0 in | Wt 243.0 lb

## 2023-10-17 DIAGNOSIS — I83813 Varicose veins of bilateral lower extremities with pain: Secondary | ICD-10-CM | POA: Diagnosis not present

## 2023-10-17 DIAGNOSIS — M7989 Other specified soft tissue disorders: Secondary | ICD-10-CM

## 2023-10-17 DIAGNOSIS — I872 Venous insufficiency (chronic) (peripheral): Secondary | ICD-10-CM

## 2023-11-15 ENCOUNTER — Ambulatory Visit (INDEPENDENT_AMBULATORY_CARE_PROVIDER_SITE_OTHER): Payer: PPO | Admitting: Family Medicine

## 2023-11-15 ENCOUNTER — Encounter: Payer: Self-pay | Admitting: Family Medicine

## 2023-11-15 VITALS — BP 126/79 | HR 60 | Ht 67.0 in | Wt 250.0 lb

## 2023-11-15 DIAGNOSIS — G8929 Other chronic pain: Secondary | ICD-10-CM | POA: Diagnosis not present

## 2023-11-15 DIAGNOSIS — R809 Proteinuria, unspecified: Secondary | ICD-10-CM

## 2023-11-15 DIAGNOSIS — M545 Low back pain, unspecified: Secondary | ICD-10-CM | POA: Diagnosis not present

## 2023-11-15 DIAGNOSIS — E1129 Type 2 diabetes mellitus with other diabetic kidney complication: Secondary | ICD-10-CM | POA: Diagnosis not present

## 2023-11-15 DIAGNOSIS — Z7985 Long-term (current) use of injectable non-insulin antidiabetic drugs: Secondary | ICD-10-CM | POA: Diagnosis not present

## 2023-11-15 LAB — BAYER DCA HB A1C WAIVED: HB A1C (BAYER DCA - WAIVED): 6.6 % — ABNORMAL HIGH (ref 4.8–5.6)

## 2023-11-15 MED ORDER — TRAMADOL HCL 50 MG PO TABS: 50.0000 mg | ORAL_TABLET | Freq: Two times a day (BID) | ORAL | 2 refills | Status: DC

## 2023-11-15 MED ORDER — SEMAGLUTIDE (1 MG/DOSE) 4 MG/3ML ~~LOC~~ SOPN: 1.0000 mg | PEN_INJECTOR | SUBCUTANEOUS | 1 refills | Status: DC

## 2023-11-15 MED ORDER — BUPROPION HCL ER (SR) 200 MG PO TB12: 200.0000 mg | ORAL_TABLET | Freq: Two times a day (BID) | ORAL | 1 refills | Status: DC

## 2023-11-15 NOTE — Progress Notes (Signed)
BP 126/79 (BP Location: Left Arm, Patient Position: Sitting, Cuff Size: Large)   Pulse 60   Ht 5\' 7"  (1.702 m)   Wt 250 lb (113.4 kg)   SpO2 96%   BMI 39.16 kg/m    Subjective:    Patient ID: Andrew [name], male    DOB: 1956/09/25, 68 y.o.   MRN: [id number]  HPI: Andrew [name] is a 68 y.o. male  Chief Complaint  Patient presents with   Diabetes    F/u diabetes Pt stated--ozempic stop working, not curving the appetite, and gaining weight.   DIABETES Hypoglycemic episodes:no Polydipsia/polyuria: no Visual disturbance: no Chest pain: no Paresthesias: no Glucose Monitoring: no  Accucheck frequency: Not Checking Taking Insulin?: no Blood Pressure Monitoring: not checking Retinal Examination: Up to Date Foot Exam: Up to Date Diabetic Education: Completed Pneumovax: Up to Date Influenza: Up to Date Aspirin: yes  CHRONIC PAIN  Present dose: 10 Morphine equivalents Pain control status: stable Duration: chronic Location: low back Quality: aching and shooting Current Pain Level: mild Previous Pain Level: mild Breakthrough pain: no Benefit from narcotic medications: yes What Activities task can be accomplished with current medication?: Able to do his ADLs Interested in weaning off narcotics:yes   Stool softners/OTC fiber: yes  Previous pain specialty evaluation: yes Non-narcotic analgesic meds: yes Narcotic contract: yes   Relevant past medical, surgical, family and social history reviewed and updated as indicated. Interim medical history since our last visit reviewed. Allergies and medications reviewed and updated.  Review of Systems  Constitutional: Negative.   Respiratory: Negative.    Cardiovascular: Negative.   Musculoskeletal:  Positive for back pain and myalgias. Negative for arthralgias, gait problem, joint swelling, neck pain and neck stiffness.  Skin: Negative.   Psychiatric/Behavioral: Negative.      Per HPI unless specifically indicated  above     Objective:    BP 126/79 (BP Location: Left Arm, Patient Position: Sitting, Cuff Size: Large)   Pulse 60   Ht 5\' 7"  (1.702 m)   Wt 250 lb (113.4 kg)   SpO2 96%   BMI 39.16 kg/m   Wt Readings from Last 3 Encounters:  11/15/23 250 lb (113.4 kg)  10/17/23 243 lb (110.2 kg)  05/15/23 230 lb 12.8 oz (104.7 kg)    Physical Exam Vitals and nursing note reviewed.  Constitutional:      General: He is not in acute distress.    Appearance: Normal appearance. He is obese. He is not ill-appearing, toxic-appearing or diaphoretic.  HENT:     Head: Normocephalic and atraumatic.     Right Ear: External ear normal.     Left Ear: External ear normal.     Nose: Nose normal.     Mouth/Throat:     Mouth: Mucous membranes are moist.     Pharynx: Oropharynx is clear.  Eyes:     General: No scleral icterus.       Right eye: No discharge.        Left eye: No discharge.     Extraocular Movements: Extraocular movements intact.     Conjunctiva/sclera: Conjunctivae normal.     Pupils: Pupils are equal, round, and reactive to light.  Cardiovascular:     Rate and Rhythm: Normal rate and regular rhythm.     Pulses: Normal pulses.     Heart sounds: Normal heart sounds. No murmur heard.    No friction rub. No gallop.  Pulmonary:     Effort: Pulmonary effort is normal.  No respiratory distress.     Breath sounds: Normal breath sounds. No stridor. No wheezing, rhonchi or rales.  Chest:     Chest wall: No tenderness.  Musculoskeletal:        General: Normal range of motion.     Cervical back: Normal range of motion and neck supple.  Skin:    General: Skin is warm and dry.     Capillary Refill: Capillary refill takes less than 2 seconds.     Coloration: Skin is not jaundiced or pale.     Findings: No bruising, erythema, lesion or rash.  Neurological:     General: No focal deficit present.     Mental Status: He is alert and oriented to person, place, and time. Mental status is at baseline.   Psychiatric:        Mood and Affect: Mood normal.        Behavior: Behavior normal.        Thought Content: Thought content normal.        Judgment: Judgment normal.     Results for orders placed or performed in visit on 09/20/23  Basic metabolic panel   Collection Time: 09/20/23  9:08 AM  Result Value Ref Range   Glucose 153 (H) 70 - 99 mg/dL   BUN 29 (H) 8 - 27 mg/dL   Creatinine, Ser 1.91 (H) 0.76 - 1.27 mg/dL   eGFR 56 (L) >47 WG/NFA/2.13   BUN/Creatinine Ratio 21 10 - 24   Sodium 139 134 - 144 mmol/L   Potassium 4.4 3.5 - 5.2 mmol/L   Chloride 101 96 - 106 mmol/L   CO2 24 20 - 29 mmol/L   Calcium 10.4 (H) 8.6 - 10.2 mg/dL      Assessment & Plan:   Problem List Items Addressed This Visit       Endocrine   Controlled type 2 diabetes mellitus with microalbuminuria (HCC) - Primary   A1c up to 6.6 off the metformin. Will increase his ozempic to 1mg  and check in in 1 month. May need to go up to 2mg .       Relevant Medications   Semaglutide, 1 MG/DOSE, 4 MG/3ML SOPN   Other Relevant Orders   Bayer DCA Hb A1c Waived     Other   Chronic low back pain   Under good control on current regimen. Continue current regimen. Continue to monitor. Call with any concerns. Refills given for 3 months. Follow up 3 months        Relevant Medications   traMADol (ULTRAM) 50 MG tablet   buPROPion (WELLBUTRIN SR) 200 MG 12 hr tablet     Follow up plan: Return in about 3 months (around 02/12/2024) for physical.

## 2023-11-15 NOTE — Assessment & Plan Note (Signed)
Under good control on current regimen. Continue current regimen. Continue to monitor. Call with any concerns. Refills given for 3 months. Follow up 3 months.

## 2023-11-15 NOTE — Assessment & Plan Note (Signed)
A1c up to 6.6 off the metformin. Will increase his ozempic to 1mg  and check in in 1 month. May need to go up to 2mg .

## 2023-11-18 ENCOUNTER — Telehealth: Payer: Self-pay | Admitting: Family Medicine

## 2023-11-18 NOTE — Telephone Encounter (Signed)
 Copied from CRM [id number]. Topic: Medicare AWV >> Nov 18, 2023  3:16 PM Payton Doughty wrote: Reason for CRM: Called 11/18/2023 to sched AWV - NO VOICEMAIL  Verlee Rossetti; Care Guide Ambulatory Clinical Support [place] [facility] Direct Dial: [phone]

## 2023-12-24 ENCOUNTER — Other Ambulatory Visit: Payer: Self-pay

## 2023-12-24 DIAGNOSIS — E291 Testicular hypofunction: Secondary | ICD-10-CM

## 2023-12-24 MED ORDER — CARETOUCH HYPODERMIC NEEDLE 25G X 1-1/2" MISC: 1.0000 | 11 refills | Status: AC

## 2024-01-15 ENCOUNTER — Other Ambulatory Visit: Payer: Self-pay | Admitting: Family Medicine

## 2024-01-15 MED ORDER — SEMAGLUTIDE (1 MG/DOSE) 4 MG/3ML ~~LOC~~ SOPN: 1.0000 mg | PEN_INJECTOR | SUBCUTANEOUS | 1 refills | Status: DC

## 2024-01-15 NOTE — Telephone Encounter (Signed)
 [facility] called and spoke to [place], [facility] about the refill(s) ozempic requested. Advised it was sent on 11/15/23 #9 ml/1 refill(s). She says they did not receive it and if it could be resent, they will get it ready for him.   Copied from CRM [id number]. Topic: Clinical - Medication Refill >> Jan 15, 2024  2:17 PM Felizardo Hotter wrote: Most Recent Primary Care Visit:  Provider: Terre Ferri P  Department: [facility]  Visit Type: PHYSICAL  Date: 11/15/2023  Medication: Ozempic   Has the patient contacted their pharmacy? Yes (Agent: If no, request that the patient contact the pharmacy for the refill. If patient does not wish to contact the pharmacy document the reason why and proceed with request.) (Agent: If yes, when and what did the pharmacy advise?)  Is this the correct pharmacy for this prescription? Yes If no, delete pharmacy and type the correct one.  This is the patient's preferred pharmacy:   [facility] [address] [place] - [address] [place] [address] Phone: [phone] Fax: [phone]   Has the prescription been filled recently? Yes  Is the patient out of the medication? Yes  Has the patient been seen for an appointment in the last year OR does the patient have an upcoming appointment? Yes  Can we respond through MyChart? Yes  Agent: Please be advised that Rx refills may take up to 3 business days. We ask that you follow-up with your pharmacy.

## 2024-01-15 NOTE — Telephone Encounter (Signed)
 Resent.  Requested Prescriptions  Pending Prescriptions Disp Refills   Semaglutide, 1 MG/DOSE, 4 MG/3ML SOPN 9 mL 1    Sig: Inject 1 mg as directed once a week.     Endocrinology:  Diabetes - GLP-1 Receptor Agonists - semaglutide Failed - 01/15/2024  3:48 PM      Failed - HBA1C in normal range and within 180 days    HB A1C (BAYER DCA - WAIVED)  Date Value Ref Range Status  11/15/2023 6.6 (H) 4.8 - 5.6 % Final    Comment:             Prediabetes: 5.7 - 6.4          Diabetes: >6.4          Glycemic control for adults with diabetes: <7.0          Failed - Cr in normal range and within 360 days    Creatinine, Ser  Date Value Ref Range Status  09/20/2023 1.39 (H) 0.76 - 1.27 mg/dL Final         Passed - Valid encounter within last 6 months    Recent Outpatient Visits           2 months ago Controlled type 2 diabetes mellitus with microalbuminuria, without long-term current use of insulin (HCC)   [facility] [place], Jerilee Montane, DO       Future Appointments             In 1 month Johnson, Jerilee Montane, DO [place] [facility], [facility]

## 2024-01-15 NOTE — Telephone Encounter (Signed)
 Requested medication (s) are due for refill today: No  Requested medication (s) are on the active medication list: No  Last refill:  Unknown  1 mg refilled in February, last OV.  Future visit scheduled: Yes  Notes to clinic:  Wrong dose requested, sending to provider     Requested Prescriptions  Pending Prescriptions Disp Refills   OZEMPIC, 0.25 OR 0.5 MG/DOSE, 2 MG/3ML SOPN [Pharmacy Med Name: Ozempic (0.25 or 0.5 MG/DOSE) 2 MG/3ML Subcutaneous Solution Pen-injector] 9 mL 0    Sig: INJECT 0.5 MG SUBCUTANEOUSLY ONCE A WEEK     Endocrinology:  Diabetes - GLP-1 Receptor Agonists - semaglutide Failed - 01/15/2024  3:40 PM      Failed - HBA1C in normal range and within 180 days    HB A1C (BAYER DCA - WAIVED)  Date Value Ref Range Status  11/15/2023 6.6 (H) 4.8 - 5.6 % Final    Comment:             Prediabetes: 5.7 - 6.4          Diabetes: >6.4          Glycemic control for adults with diabetes: <7.0          Failed - Cr in normal range and within 360 days    Creatinine, Ser  Date Value Ref Range Status  09/20/2023 1.39 (H) 0.76 - 1.27 mg/dL Final         Passed - Valid encounter within last 6 months    Recent Outpatient Visits           2 months ago Controlled type 2 diabetes mellitus with microalbuminuria, without long-term current use of insulin (HCC)   [place] [facility] [place], Jerilee Montane, DO       Future Appointments             In 1 month Johnson, Jerilee Montane, DO [place] [facility], [facility]

## 2024-01-23 ENCOUNTER — Encounter: Payer: Self-pay | Admitting: Radiology

## 2024-02-03 ENCOUNTER — Encounter: Payer: Self-pay | Admitting: Family Medicine

## 2024-02-03 NOTE — Telephone Encounter (Signed)
 Dr. Lincoln Renshaw sent prescription for a year supply.  Please find out if there is a problem with him getting this from the pharmacy.

## 2024-02-05 ENCOUNTER — Other Ambulatory Visit: Payer: Self-pay | Admitting: Family Medicine

## 2024-02-05 NOTE — Telephone Encounter (Signed)
 Copied from CRM [id number]. Topic: Clinical - Medication Refill >> Feb 05, 2024  9:59 AM Antwanette L wrote: Medication: diclofenac  (VOLTAREN ) 75 MG EC tablet  Has the patient contacted their pharmacy? Yes   Is this the correct pharmacy for this prescription? Yes   This is the patient's preferred pharmacy:  [facility] - [place], [place] - [address] [place] [address] Phone: [phone] Fax: [phone]  Has the prescription been filled recently? No. Last refilled on 11/15/22  Is the patient out of the medication? Yes  Has the patient been seen for an appointment in the last year OR does the patient have an upcoming appointment? Yes  Can we respond through MyChart? No. Contact patient by phone at [phone]  Agent: Please be advised that Rx refills may take up to 3 business days. We ask that you follow-up with your pharmacy.

## 2024-02-07 NOTE — Telephone Encounter (Signed)
 Requested medication (s) are due for refill today: yes  Requested medication (s) are on the active medication list: yes  Last refill:   11/15/22 #180 3 RF  Future visit scheduled: yes  Notes to clinic:  Creatinine out of range   Requested Prescriptions  Pending Prescriptions Disp Refills   diclofenac  (VOLTAREN ) 75 MG EC tablet 180 tablet 3    Sig: Take 1 tablet (75 mg total) by mouth 2 (two) times daily.     Analgesics:  NSAIDS Failed - 02/07/2024 10:52 AM      Failed - Manual Review: Labs are only required if the patient has taken medication for more than 8 weeks.      Failed - Cr in normal range and within 360 days    Creatinine, Ser  Date Value Ref Range Status  09/20/2023 1.39 (H) 0.76 - 1.27 mg/dL Final         Passed - HGB in normal range and within 360 days    Hemoglobin  Date Value Ref Range Status  08/26/2023 13.4 13.0 - 17.7 g/dL Final         Passed - PLT in normal range and within 360 days    Platelets  Date Value Ref Range Status  08/26/2023 160 150 - 450 x10E3/uL Final         Passed - HCT in normal range and within 360 days    Hematocrit  Date Value Ref Range Status  08/26/2023 40.5 37.5 - 51.0 % Final         Passed - eGFR is 30 or above and within 360 days    GFR calc Af Amer  Date Value Ref Range Status  05/30/2020 96 >59 mL/min/1.73 Final    Comment:    **[facility] currently reports eGFR in compliance with the current**   recommendations of the [employer]. [facility] will   update reporting as new guidelines are published from the NKF-ASN   Task force.    GFR, Estimated  Date Value Ref Range Status  11/29/2022 >60 >60 mL/min Final    Comment:    (NOTE) Calculated using the CKD-EPI Creatinine Equation (2021)    eGFR  Date Value Ref Range Status  09/20/2023 56 (L) >59 mL/min/1.73 Final         Passed - Patient is not pregnant      Passed - Valid encounter within last 12 months    Recent Outpatient Visits           2  months ago Controlled type 2 diabetes mellitus with microalbuminuria, without long-term current use of insulin (HCC)   [facility], Jerilee Montane, DO       Future Appointments             In 2 weeks Lincoln Renshaw, Jerilee Montane, DO [facility]

## 2024-02-08 MED ORDER — DICLOFENAC SODIUM 75 MG PO TBEC: 75.0000 mg | DELAYED_RELEASE_TABLET | Freq: Two times a day (BID) | ORAL | 3 refills | Status: DC

## 2024-02-18 ENCOUNTER — Encounter (INDEPENDENT_AMBULATORY_CARE_PROVIDER_SITE_OTHER): Payer: Self-pay

## 2024-02-19 DIAGNOSIS — M533 Sacrococcygeal disorders, not elsewhere classified: Secondary | ICD-10-CM | POA: Diagnosis not present

## 2024-02-21 ENCOUNTER — Encounter: Payer: Self-pay | Admitting: Family Medicine

## 2024-02-21 ENCOUNTER — Ambulatory Visit (INDEPENDENT_AMBULATORY_CARE_PROVIDER_SITE_OTHER): Payer: PPO | Admitting: Family Medicine

## 2024-02-21 VITALS — BP 139/89 | HR 56 | Temp 98.7°F | Ht 68.0 in | Wt 247.0 lb

## 2024-02-21 DIAGNOSIS — Z0000 Encounter for general adult medical examination without abnormal findings: Secondary | ICD-10-CM | POA: Diagnosis not present

## 2024-02-21 DIAGNOSIS — Z87891 Personal history of nicotine dependence: Secondary | ICD-10-CM

## 2024-02-21 DIAGNOSIS — E1129 Type 2 diabetes mellitus with other diabetic kidney complication: Secondary | ICD-10-CM | POA: Diagnosis not present

## 2024-02-21 DIAGNOSIS — M545 Low back pain, unspecified: Secondary | ICD-10-CM

## 2024-02-21 DIAGNOSIS — J410 Simple chronic bronchitis: Secondary | ICD-10-CM

## 2024-02-21 DIAGNOSIS — I700 Atherosclerosis of aorta: Secondary | ICD-10-CM

## 2024-02-21 DIAGNOSIS — I129 Hypertensive chronic kidney disease with stage 1 through stage 4 chronic kidney disease, or unspecified chronic kidney disease: Secondary | ICD-10-CM

## 2024-02-21 DIAGNOSIS — I2510 Atherosclerotic heart disease of native coronary artery without angina pectoris: Secondary | ICD-10-CM | POA: Diagnosis not present

## 2024-02-21 DIAGNOSIS — N401 Enlarged prostate with lower urinary tract symptoms: Secondary | ICD-10-CM | POA: Diagnosis not present

## 2024-02-21 DIAGNOSIS — E291 Testicular hypofunction: Secondary | ICD-10-CM

## 2024-02-21 DIAGNOSIS — G8929 Other chronic pain: Secondary | ICD-10-CM

## 2024-02-21 DIAGNOSIS — R809 Proteinuria, unspecified: Secondary | ICD-10-CM

## 2024-02-21 LAB — MICROALBUMIN, URINE WAIVED
Creatinine, Urine Waived: 100 mg/dL (ref 10–300)
Microalb, Ur Waived: 80 mg/L — ABNORMAL HIGH (ref 0–19)

## 2024-02-21 LAB — BAYER DCA HB A1C WAIVED: HB A1C (BAYER DCA - WAIVED): 7.2 % — ABNORMAL HIGH (ref 4.8–5.6)

## 2024-02-21 MED ORDER — METFORMIN HCL ER 500 MG PO TB24: 1000.0000 mg | ORAL_TABLET | Freq: Every day | ORAL | 0 refills | Status: DC

## 2024-02-21 MED ORDER — TADALAFIL 5 MG PO TABS: ORAL_TABLET | ORAL | 1 refills | Status: DC

## 2024-02-21 MED ORDER — GABAPENTIN 300 MG PO CAPS: 300.0000 mg | ORAL_CAPSULE | Freq: Three times a day (TID) | ORAL | 1 refills | Status: DC

## 2024-02-21 MED ORDER — BENAZEPRIL HCL 40 MG PO TABS: 40.0000 mg | ORAL_TABLET | Freq: Every day | ORAL | 1 refills | Status: DC

## 2024-02-21 MED ORDER — TRAMADOL HCL 50 MG PO TABS: 50.0000 mg | ORAL_TABLET | Freq: Two times a day (BID) | ORAL | 2 refills | Status: DC

## 2024-02-21 MED ORDER — METOPROLOL SUCCINATE ER 100 MG PO TB24: 100.0000 mg | ORAL_TABLET | Freq: Every day | ORAL | 1 refills | Status: DC

## 2024-02-21 MED ORDER — HYDROCHLOROTHIAZIDE 12.5 MG PO CAPS: 12.5000 mg | ORAL_CAPSULE | Freq: Every day | ORAL | 1 refills | Status: DC

## 2024-02-21 MED ORDER — BUPROPION HCL ER (SR) 200 MG PO TB12: 200.0000 mg | ORAL_TABLET | Freq: Two times a day (BID) | ORAL | 1 refills | Status: DC

## 2024-02-21 MED ORDER — DICLOFENAC SODIUM 75 MG PO TBEC: 75.0000 mg | DELAYED_RELEASE_TABLET | Freq: Two times a day (BID) | ORAL | 1 refills | Status: DC

## 2024-02-21 NOTE — Assessment & Plan Note (Signed)
 Under good control on current regimen. Continue current regimen. Continue to monitor. Call with any concerns. Refills given.

## 2024-02-21 NOTE — Assessment & Plan Note (Signed)
Will keep BP, sugars and cholesterol under good control. Continue to monitor. Call with any concerns.  

## 2024-02-21 NOTE — Assessment & Plan Note (Signed)
 A1c up to 7.2. Only stopped his ozempic  1 week ago. Will restart his metformin  and recheck in 3 months. Call with any concerns.

## 2024-02-21 NOTE — Progress Notes (Signed)
 BP 139/89   Pulse (!) 56   Temp 98.7 F (37.1 C) (Oral)   Ht 5\' 8"  (1.727 m)   Wt 247 lb (112 kg)   SpO2 97%   BMI 37.56 kg/m    Subjective:    Patient ID: Andrew [name], male    DOB: 22-Feb-1956, 68 y.o.   MRN: [id number]  HPI: Andrew [name] is a 68 y.o. male presenting on 02/21/2024 for comprehensive medical examination. Current medical complaints include:  DIABETES- was very fatigued on the ozempic , so he stopped it about 1 week ago Hypoglycemic episodes:no Polydipsia/polyuria: no Visual disturbance: no Chest pain: no Paresthesias: yes Glucose Monitoring: yes  Accucheck frequency: occasionally Taking Insulin?: no Blood Pressure Monitoring: rarely Retinal Examination: Not up to Date Foot Exam: Up to Date Diabetic Education: Completed Pneumovax: Up to Date Influenza: Up to Date Aspirin : yes  HYPERTENSION / HYPERLIPIDEMIA Satisfied with current treatment? yes Duration of hypertension: chronic BP monitoring frequency: not checking BP medication side effects: no Past BP meds: benazepril , hydrochlorothiazide , metoprolol  Duration of hyperlipidemia: chronic Cholesterol medication side effects: no Cholesterol supplements: none Past cholesterol medications: crestor  Medication compliance: excellent compliance Aspirin : yes Recent stressors: no Recurrent headaches: no Visual changes: no Palpitations: no Dyspnea: no Chest pain: no Lower extremity edema: no Dizzy/lightheaded: no  CHRONIC PAIN  Present dose: 10 Morphine equivalents Pain control status: controlled Duration: chronic Location: back, neck Quality: aching, sore, burning Current Pain Level: mild Previous Pain Level: severe Breakthrough pain: no Benefit from narcotic medications: yes What Activities task can be accomplished with current medication? Able to do his ADLs Interested in weaning off narcotics:no   Stool softners/OTC fiber: yes  Previous pain specialty evaluation: yes Non-narcotic  analgesic meds: yes Narcotic contract: yes  LOW TESTOSTERONE  Duration: chronic Status: uncontrolled  Satisfied with current treatment:  no Previous testosterone  therapies: injections Medication side effects:  yes Medication compliance: excellent compliance Decreased libido: no Fatigue: yes Depressed mood: yes Muscle weakness: yes Erectile dysfunction: no   He currently lives with:wife Interim Problems from his last visit: no  Functional Status Survey: Is the patient deaf or have difficulty hearing?: Yes Does the patient have difficulty seeing, even when wearing glasses/contacts?: No Does the patient have difficulty concentrating, remembering, or making decisions?: No Does the patient have difficulty walking or climbing stairs?: No Does the patient have difficulty dressing or bathing?: No Does the patient have difficulty doing errands alone such as visiting a doctor's office or shopping?: No  FALL RISK:    02/21/2024    8:19 AM 05/15/2023    8:37 AM 02/12/2023    8:31 AM 11/30/2022    1:20 PM 11/13/2022    8:08 AM  Fall Risk   Falls in the past year? 0 0 0 0 0  Number falls in past yr: 0 0 0 0 0  Injury with Fall? 0 0 0 0 0  Risk for fall due to : No Fall Risks No Fall Risks No Fall Risks No Fall Risks No Fall Risks  Follow up  Falls evaluation completed Falls evaluation completed Falls evaluation completed Falls evaluation completed    Depression Screen    02/21/2024    8:19 AM 08/26/2023    8:29 AM 05/15/2023    8:40 AM 02/12/2023    8:31 AM 01/04/2023    8:50 AM  Depression screen PHQ 2/9  Decreased Interest 0 0 0 0 0  Down, Depressed, Hopeless 0 0 0 0 0  PHQ - 2 Score 0  0 0 0 0  Altered sleeping 0 0 0 0 0  Tired, decreased energy 0 0 0 0 0  Change in appetite 0 0 0 0 0  Feeling bad or failure about yourself  0 0 0 0 0  Trouble concentrating 0 0 0 0 0  Moving slowly or fidgety/restless 0 0 0 0 0  Suicidal thoughts 0 0 0 0 0  PHQ-9 Score 0 0 0 0 0  Difficult  doing work/chores Not difficult at all Not difficult at all Not difficult at all Not difficult at all Not difficult at all    Advanced Directives Does patient have a HCPOA?    no If yes, name and contact information:  Does patient have a living will or MOST form?  no  Past Medical History:  Past Medical History:  Diagnosis Date   Chronic pain syndrome    Depression    Diverticulosis    DVT (deep venous thrombosis) (HCC)    bilateral, was on anticoags for 3months   Ear infection 08/2016   CLEARING UP-FINISHED ANTIBIOTIC AND PREDNISONE  TAPER    Family history of adverse reaction to anesthesia    BROTHER-N/V   Hernia, umbilical    History of kidney stones    Hyperlipidemia    Hypertension    IFG (impaired fasting glucose)    Knee pain, chronic    Pes anserine bursitis     Surgical History:  Past Surgical History:  Procedure Laterality Date   COLONOSCOPY  2015   JOINT REPLACEMENT Bilateral 2009   replacement and revision knee   KIDNEY STONE SURGERY     SHOULDER ARTHROSCOPY WITH ROTATOR CUFF REPAIR Right    UMBILICAL HERNIA REPAIR N/A 09/06/2016   Procedure: HERNIA REPAIR UMBILICAL ADULT;  Surgeon: Marshall Skeeter, MD;  Location: [facility] ORS;  Service: General;  Laterality: N/A;    Medications:  Current Outpatient Medications on File Prior to Visit  Medication Sig   aspirin  EC 81 MG tablet Take 1 tablet (81 mg total) by mouth daily. Swallow whole.   Blood Glucose Monitoring Suppl (ONETOUCH VERIO) w/Device KIT 1 kit by Does not apply route daily.   Blood Pressure Monitoring (ADULT BLOOD PRESSURE CUFF LG) KIT 1 each by Does not apply route daily.   glucose blood test strip Use as instructed   NEEDLE, DISP, 25 G (CARETOUCH HYPODERMIC NEEDLE) 25G X 1-1/2" MISC 1 each by Does not apply route as directed.   omeprazole  (PRILOSEC) 20 MG capsule Take 20 mg by mouth daily.   OneTouch Delica Lancets 33G MISC 1 each by Does not apply route in the morning and at bedtime.    rosuvastatin  (CRESTOR ) 5 MG tablet Take 1 tablet (5 mg total) by mouth once a week.   testosterone  cypionate (DEPOTESTOTERONE CYPIONATE) 100 MG/ML injection Inject 1 mL (100 mg total) into the muscle every 14 (fourteen) days. For IM use only   No current facility-administered medications on file prior to visit.    Allergies:  Allergies  Allergen Reactions   Other Other (See Comments)    Adhesive tape gave blisters   Flomax  [Tamsulosin ] Other (See Comments)    Sexual dysfunction   Statins Other (See Comments)    Muscle weakness, dizziness and fatigue    Social History:  Social History   Socioeconomic History   Marital status: Married    Spouse name: Not on file   Number of children: 2   Years of education: Not on file   Highest education  level: Not on file  Occupational History   Not on file  Tobacco Use   Smoking status: Former    Current packs/day: 0.00    Average packs/day: 1.5 packs/day for 25.0 years (37.5 ttl pk-yrs)    Types: Cigarettes    Start date: 06/27/1986    Quit date: 06/28/2011    Years since quitting: 12.6   Smokeless tobacco: Never  Vaping Use   Vaping status: Never Used  Substance and Sexual Activity   Alcohol use: Yes    Comment: on occasion   Drug use: No   Sexual activity: Yes    Birth control/protection: None  Other Topics Concern   Not on file  Social History Narrative   Not on file   Social Drivers of Health   Financial Resource Strain: Patient Declined (02/11/2023)   Overall Financial Resource Strain (CARDIA)    Difficulty of Paying Living Expenses: Patient declined  Food Insecurity: Patient Declined (02/11/2023)   Hunger Vital Sign    Worried About Running Out of Food in the Last Year: Patient declined    Ran Out of Food in the Last Year: Patient declined  Transportation Needs: Patient Declined (02/11/2023)   PRAPARE - Administrator, Civil Service (Medical): Patient declined    Lack of Transportation (Non-Medical): Patient  declined  Physical Activity: Unknown (02/11/2023)   Exercise Vital Sign    Days of Exercise per Week: Patient declined    Minutes of Exercise per Session: Not on file  Stress: Patient Declined (02/11/2023)   [employer] of Occupational Health - Occupational Stress Questionnaire    Feeling of Stress : Patient declined  Social Connections: Unknown (02/11/2023)   Social Connection and Isolation Panel [NHANES]    Frequency of Communication with Friends and Family: Patient declined    Frequency of Social Gatherings with Friends and Family: Patient declined    Attends Religious Services: Patient declined    Database administrator or Organizations: Patient declined    Attends Banker Meetings: Not on file    Marital Status: Patient declined  Intimate Partner Violence: Not At Risk (06/12/2022)   Humiliation, Afraid, Rape, and Kick questionnaire    Fear of Current or Ex-Partner: No    Emotionally Abused: No    Physically Abused: No    Sexually Abused: No   Social History   Tobacco Use  Smoking Status Former   Current packs/day: 0.00   Average packs/day: 1.5 packs/day for 25.0 years (37.5 ttl pk-yrs)   Types: Cigarettes   Start date: 06/27/1986   Quit date: 06/28/2011   Years since quitting: 12.6  Smokeless Tobacco Never   Social History   Substance and Sexual Activity  Alcohol Use Yes   Comment: on occasion    Family History:  Family History  Problem Relation Age of Onset   Pneumonia Mother 58   Alcohol abuse Father    Hypertension Father    Cirrhosis Father    Hypertension Brother    Stroke Paternal Grandmother    Stroke Paternal Grandfather     Past medical history, surgical history, medications, allergies, family history and social history reviewed with patient today and changes made to appropriate areas of the chart.   Review of Systems  Constitutional:  Positive for malaise/fatigue. Negative for chills, diaphoresis, fever and weight loss.  HENT:  Negative.    Eyes: Negative.   Respiratory: Negative.    Cardiovascular:  Positive for leg swelling (last week at the beach, better  now). Negative for chest pain, palpitations, orthopnea, claudication and PND.  Gastrointestinal: Negative.   Genitourinary: Negative.   Musculoskeletal:  Positive for back pain and myalgias. Negative for falls, joint pain and neck pain.  Skin: Negative.   Neurological:  Positive for tingling. Negative for dizziness, tremors, sensory change, speech change, focal weakness, seizures, loss of consciousness, weakness and headaches.  Endo/Heme/Allergies:  Negative for environmental allergies and polydipsia. Bruises/bleeds easily.  Psychiatric/Behavioral: Negative.     All other ROS negative except what is listed above and in the HPI.      Objective:     BP 139/89   Pulse (!) 56   Temp 98.7 F (37.1 C) (Oral)   Ht 5\' 8"  (1.727 m)   Wt 247 lb (112 kg)   SpO2 97%   BMI 37.56 kg/m   Wt Readings from Last 3 Encounters:  02/21/24 247 lb (112 kg)  11/15/23 250 lb (113.4 kg)  10/17/23 243 lb (110.2 kg)    No results found.  Physical Exam Vitals and nursing note reviewed.  Constitutional:      General: He is not in acute distress.    Appearance: Normal appearance. He is obese. He is not ill-appearing, toxic-appearing or diaphoretic.  HENT:     Head: Normocephalic and atraumatic.     Right Ear: Tympanic membrane, ear canal and external ear normal. There is no impacted cerumen.     Left Ear: Tympanic membrane, ear canal and external ear normal. There is no impacted cerumen.     Nose: Nose normal. No congestion or rhinorrhea.     Mouth/Throat:     Mouth: Mucous membranes are moist.     Pharynx: Oropharynx is clear. No oropharyngeal exudate or posterior oropharyngeal erythema.  Eyes:     General: No scleral icterus.       Right eye: No discharge.        Left eye: No discharge.     Extraocular Movements: Extraocular movements intact.      Conjunctiva/sclera: Conjunctivae normal.     Pupils: Pupils are equal, round, and reactive to light.  Neck:     Vascular: No carotid bruit.  Cardiovascular:     Rate and Rhythm: Normal rate and regular rhythm.     Pulses: Normal pulses.     Heart sounds: No murmur heard.    No friction rub. No gallop.  Pulmonary:     Effort: Pulmonary effort is normal. No respiratory distress.     Breath sounds: Normal breath sounds. No stridor. No wheezing, rhonchi or rales.  Chest:     Chest wall: No tenderness.  Abdominal:     General: Abdomen is flat. Bowel sounds are normal. There is no distension.     Palpations: Abdomen is soft. There is no mass.     Tenderness: There is no abdominal tenderness. There is no right CVA tenderness, left CVA tenderness, guarding or rebound.     Hernia: No hernia is present.  Genitourinary:    Comments: Genital exam deferred with shared decision making Musculoskeletal:        General: No swelling, tenderness, deformity or signs of injury.     Cervical back: Normal range of motion and neck supple. No rigidity. No muscular tenderness.     Right lower leg: No edema.     Left lower leg: No edema.  Lymphadenopathy:     Cervical: No cervical adenopathy.  Skin:    General: Skin is warm and dry.  Capillary Refill: Capillary refill takes less than 2 seconds.     Coloration: Skin is not jaundiced or pale.     Findings: No bruising, erythema, lesion or rash.  Neurological:     General: No focal deficit present.     Mental Status: He is alert and oriented to person, place, and time.     Cranial Nerves: No cranial nerve deficit.     Sensory: No sensory deficit.     Motor: No weakness.     Coordination: Coordination normal.     Gait: Gait normal.     Deep Tendon Reflexes: Reflexes normal.  Psychiatric:        Mood and Affect: Mood normal.        Behavior: Behavior normal.        Thought Content: Thought content normal.        Judgment: Judgment normal.         02/21/2024    8:22 AM 11/13/2022    8:51 AM  6CIT Screen  What Year? 0 points 0 points  What month? 0 points 0 points  What time? 0 points 0 points  Count back from 20 0 points 0 points  Months in reverse 0 points 0 points  Repeat phrase 0 points 2 points  Total Score 0 points 2 points                              Results for orders placed or performed in visit on 11/15/23  Bayer DCA Hb A1c Waived   Collection Time: 11/15/23  9:54 AM  Result Value Ref Range   HB A1C (BAYER DCA - WAIVED) 6.6 (H) 4.8 - 5.6 %      Assessment & Plan:   Problem List Items Addressed This Visit       Cardiovascular and Mediastinum   CAD (coronary artery disease)   Will keep BP, sugars and cholesterol under good control. Continue to monitor. Call with any concerns.       Relevant Medications   benazepril  (LOTENSIN ) 40 MG tablet   hydrochlorothiazide  (MICROZIDE ) 12.5 MG capsule   metoprolol  succinate (TOPROL -XL) 100 MG 24 hr tablet   tadalafil  (CIALIS ) 5 MG tablet   Aortic atherosclerosis (HCC)   Will keep BP, sugars and cholesterol under good control. Continue to monitor. Call with any concerns.       Relevant Medications   benazepril  (LOTENSIN ) 40 MG tablet   hydrochlorothiazide  (MICROZIDE ) 12.5 MG capsule   metoprolol  succinate (TOPROL -XL) 100 MG 24 hr tablet   tadalafil  (CIALIS ) 5 MG tablet     Respiratory   COPD (chronic obstructive pulmonary disease) (HCC)   Under good control on current regimen. Continue current regimen. Continue to monitor. Call with any concerns. Refills given.          Endocrine   Hypogonadism in male   Rechecking labs today. Await results. Treat as needed.       Relevant Orders   PSA   Testosterone , free, total([facility])   Controlled type 2 diabetes mellitus with microalbuminuria (HCC)   A1c up to 7.2. Only stopped his ozempic  1 week ago. Will restart his metformin  and recheck in 3 months. Call with any concerns.       Relevant  Medications   benazepril  (LOTENSIN ) 40 MG tablet   metFORMIN  (GLUCOPHAGE -XR) 500 MG 24 hr tablet   Other Relevant Orders   Comprehensive metabolic panel with GFR   CBC with  Differential/Platelet   Lipid Panel w/o Chol/HDL Ratio   TSH   Microalbumin, Urine Waived   Bayer DCA Hb A1c Waived   Pneumococcal conjugate vaccine 20-valent (Prevnar 20)     Genitourinary   Benign hypertensive renal disease   Under good control on current regimen. Continue current regimen. Continue to monitor. Call with any concerns. Refills given. Labs drawn today.        Benign prostatic hyperplasia with incomplete bladder emptying   Under good control on current regimen. Continue current regimen. Continue to monitor. Call with any concerns. Refills given.          Other   Chronic low back pain   Under good control on current regimen. Continue current regimen. Continue to monitor. Call with any concerns. Refills given for 3 months. Follow up 3 months.        Relevant Medications   buPROPion  (WELLBUTRIN  SR) 200 MG 12 hr tablet   gabapentin  (NEURONTIN ) 300 MG capsule   traMADol  (ULTRAM ) 50 MG tablet   diclofenac  (VOLTAREN ) 75 MG EC tablet   Other Visit Diagnoses       Encounter for annual wellness exam in Medicare patient    -  Primary   Preventative care discussed today as below.     Routine general medical examination at a health care facility       Vaccines up to date. Screening labs checked today. Colonoscopy due in December. Continue diet and exercise. Call with any concerns.     History of smoking 30 or more pack years       Due for low dose CT scan. Ordered today.   Relevant Orders   CT CHEST LUNG CA SCREEN LOW DOSE W/O CM     Morbid obesity (HCC)       Encouraged diet and exercise with goal of losing 1-2 lbs per week. Continue to monitor.   Relevant Medications   metFORMIN  (GLUCOPHAGE -XR) 500 MG 24 hr tablet        Preventative Services:  Health Risk Assessment and Personalized  Prevention Plan: Done today Bone Mass Measurements: N/A CVD Screening: Done today Colon Cancer Screening: due in December Depression Screening: done today Diabetes Screening: done today Glaucoma Screening: see your eye doctor Hepatitis B vaccine: N/A Hepatitis C screening: up to date HIV Screening: up to date Flu Vaccine: get in the fall Lung cancer Screening: ordered today Obesity Screening: done today Pneumonia Vaccines (2): done today STI Screening: N/A PSA screening: done today  Discussed aspirin  prophylaxis for myocardial infarction prevention and decision was made to continue ASA  LABORATORY TESTING:  Health maintenance labs ordered today as discussed above.   The natural history of prostate cancer and ongoing controversy regarding screening and potential treatment outcomes of prostate cancer has been discussed with the patient. The meaning of a false positive PSA and a false negative PSA has been discussed. He indicates understanding of the limitations of this screening test and wishes to proceed with screening PSA testing.   IMMUNIZATIONS:   - Tdap: Tetanus vaccination status reviewed: last tetanus booster within 10 years. - Influenza: Up to date - Pneumovax: Up to date - Prevnar: Administered today - Zostavax vaccine: Given elsewhere  SCREENING: - Colonoscopy: Up to date  Discussed with patient purpose of the colonoscopy is to detect colon cancer at curable precancerous or early stages   PATIENT COUNSELING:    Sexuality: Discussed sexually transmitted diseases, partner selection, use of condoms, avoidance of unintended pregnancy  and contraceptive alternatives.  Advised to avoid cigarette smoking.  I discussed with the patient that most people either abstain from alcohol or drink within safe limits (<=14/week and <=4 drinks/occasion for males, <=7/weeks and <= 3 drinks/occasion for females) and that the risk for alcohol disorders and other health effects rises  proportionally with the number of drinks per week and how often a drinker exceeds daily limits.  Discussed cessation/primary prevention of drug use and availability of treatment for abuse.   Diet: Encouraged to adjust caloric intake to maintain  or achieve ideal body weight, to reduce intake of dietary saturated fat and total fat, to limit sodium intake by avoiding high sodium foods and not adding table salt, and to maintain adequate dietary potassium and calcium  preferably from fresh fruits, vegetables, and low-fat dairy products.    stressed the importance of regular exercise  Injury prevention: Discussed safety belts, safety helmets, smoke detector, smoking near bedding or upholstery.   Dental health: Discussed importance of regular tooth brushing, flossing, and dental visits.   Follow up plan: NEXT PREVENTATIVE PHYSICAL DUE IN 1 YEAR. Return in about 3 months (around 05/23/2024).

## 2024-02-21 NOTE — Patient Instructions (Addendum)
  [name] , Thank you for taking time to come for your Medicare Wellness Visit. I appreciate your ongoing commitment to your health goals. Please review the following plan we discussed and let me know if I can assist you in the future.   These are the goals we discussed:  Goals   None     This is a list of the screening recommended for you and due dates:  Health Maintenance  Topic Date Due   Zoster (Shingles) Vaccine (1 of 2) Never done   Pneumonia Vaccine (2 of 2 - PCV) 12/03/2020   Yearly kidney health urinalysis for diabetes  11/14/2023   Medicare Annual Wellness Visit  01/04/2024   Screening for Lung Cancer  02/15/2024   Eye exam for diabetics  02/19/2024   Flu Shot  05/01/2024   Hemoglobin A1C  05/14/2024   Complete foot exam   08/25/2024   Colon Cancer Screening  09/15/2024   Yearly kidney function blood test for diabetes  09/19/2024   DTaP/Tdap/Td vaccine (3 - Td or Tdap) 03/28/2028   Hepatitis C Screening  Addressed   HPV Vaccine  Aged Out   Meningitis B Vaccine  Aged Out   COVID-19 Vaccine  Discontinued    Preventative Services:  Health Risk Assessment and Personalized Prevention Plan: Done today Bone Mass Measurements: N/A CVD Screening: Done today Colon Cancer Screening: due in December Depression Screening: done today Diabetes Screening: done today Glaucoma Screening: see your eye doctor Hepatitis B vaccine: N/A Hepatitis C screening: up to date HIV Screening: up to date Flu Vaccine: get in the fall Lung cancer Screening: ordered today Obesity Screening: done today Pneumonia Vaccines (2): done today STI Screening: N/A PSA screening: done today

## 2024-02-21 NOTE — Progress Notes (Signed)
 Subjective:   Andrew [name] is a 68 y.o. male who presents for Medicare Annual/Subsequent preventive examination.  Visit Complete: In person  Patient Medicare AWV questionnaire was completed by the patient on 02/21/24; I have confirmed that all information answered by patient is correct and no changes since this date.        Objective:    Today's Vitals   02/21/24 0813 02/21/24 0820  BP: (!) 142/87 139/89  Pulse: (!) 56 (!) 56  Temp: 98.7 F (37.1 C)   TempSrc: Oral   SpO2: 97%   Weight: 247 lb (112 kg)   Height: 5\' 8"  (1.727 m)   PainSc: 0-No pain    Body mass index is 37.56 kg/m.     02/21/2024    8:46 AM 08/28/2016   12:05 PM  Advanced Directives  Does Patient Have a Medical Advance Directive? No Yes  Type of Advance Directive  Living will  Would patient like information on creating a medical advance directive? No - Patient declined     Current Medications (verified) Outpatient Encounter Medications as of 02/21/2024  Medication Sig   aspirin  EC 81 MG tablet Take 1 tablet (81 mg total) by mouth daily. Swallow whole.   Blood Glucose Monitoring Suppl (ONETOUCH VERIO) w/Device KIT 1 kit by Does not apply route daily.   Blood Pressure Monitoring (ADULT BLOOD PRESSURE CUFF LG) KIT 1 each by Does not apply route daily.   glucose blood test strip Use as instructed   NEEDLE, DISP, 25 G (CARETOUCH HYPODERMIC NEEDLE) 25G X 1-1/2" MISC 1 each by Does not apply route as directed.   omeprazole  (PRILOSEC) 20 MG capsule Take 20 mg by mouth daily.   OneTouch Delica Lancets 33G MISC 1 each by Does not apply route in the morning and at bedtime.   rosuvastatin  (CRESTOR ) 5 MG tablet Take 1 tablet (5 mg total) by mouth once a week.   testosterone  cypionate (DEPOTESTOTERONE CYPIONATE) 100 MG/ML injection Inject 1 mL (100 mg total) into the muscle every 14 (fourteen) days. For IM use only   [DISCONTINUED] benazepril  (LOTENSIN ) 40 MG tablet Take 1 tablet (40 mg total) by mouth daily.    [DISCONTINUED] buPROPion  (WELLBUTRIN  SR) 200 MG 12 hr tablet Take 1 tablet (200 mg total) by mouth 2 (two) times daily.   [DISCONTINUED] diclofenac  (VOLTAREN ) 75 MG EC tablet Take 1 tablet (75 mg total) by mouth 2 (two) times daily.   [DISCONTINUED] gabapentin  (NEURONTIN ) 300 MG capsule Take 300 mg by mouth 3 (three) times daily.   [DISCONTINUED] hydrochlorothiazide  (MICROZIDE ) 12.5 MG capsule Take 1 capsule (12.5 mg total) by mouth daily.   [DISCONTINUED] metoprolol  succinate (TOPROL -XL) 100 MG 24 hr tablet Take 1 tablet (100 mg total) by mouth daily.   [DISCONTINUED] tadalafil  (CIALIS ) 5 MG tablet TAKE 1 TABLET(5 MG) BY MOUTH DAILY   [DISCONTINUED] traMADol  (ULTRAM ) 50 MG tablet Take 1 tablet (50 mg total) by mouth 2 (two) times daily.   benazepril  (LOTENSIN ) 40 MG tablet Take 1 tablet (40 mg total) by mouth daily.   buPROPion  (WELLBUTRIN  SR) 200 MG 12 hr tablet Take 1 tablet (200 mg total) by mouth 2 (two) times daily.   diclofenac  (VOLTAREN ) 75 MG EC tablet Take 1 tablet (75 mg total) by mouth 2 (two) times daily.   gabapentin  (NEURONTIN ) 300 MG capsule Take 1 capsule (300 mg total) by mouth 3 (three) times daily.   hydrochlorothiazide  (MICROZIDE ) 12.5 MG capsule Take 1 capsule (12.5 mg total) by mouth daily.  metFORMIN  (GLUCOPHAGE -XR) 500 MG 24 hr tablet Take 2 tablets (1,000 mg total) by mouth daily with breakfast.   metoprolol  succinate (TOPROL -XL) 100 MG 24 hr tablet Take 1 tablet (100 mg total) by mouth daily.   tadalafil  (CIALIS ) 5 MG tablet TAKE 1 TABLET(5 MG) BY MOUTH DAILY   traMADol  (ULTRAM ) 50 MG tablet Take 1 tablet (50 mg total) by mouth 2 (two) times daily.   [DISCONTINUED] metFORMIN  (GLUCOPHAGE -XR) 500 MG 24 hr tablet Take 2 tablets (1,000 mg total) by mouth daily with breakfast. TAKE 2 TABLETS(1000 MG) BY MOUTH TWICE DAILY   [DISCONTINUED] Semaglutide , 1 MG/DOSE, 4 MG/3ML SOPN Inject 1 mg as directed once a week. (Patient not taking: Reported on 02/21/2024)   No  facility-administered encounter medications on file as of 02/21/2024.    Allergies (verified) Other, Flomax  [tamsulosin ], and Statins   History: Past Medical History:  Diagnosis Date   Chronic pain syndrome    Depression    Diverticulosis    DVT (deep venous thrombosis) (HCC)    bilateral, was on anticoags for 3months   Ear infection 08/2016   CLEARING UP-FINISHED ANTIBIOTIC AND PREDNISONE  TAPER    Family history of adverse reaction to anesthesia    BROTHER-N/V   Hernia, umbilical    History of kidney stones    Hyperlipidemia    Hypertension    IFG (impaired fasting glucose)    Knee pain, chronic    Pes anserine bursitis    Past Surgical History:  Procedure Laterality Date   COLONOSCOPY  2015   JOINT REPLACEMENT Bilateral 2009   replacement and revision knee   KIDNEY STONE SURGERY     SHOULDER ARTHROSCOPY WITH ROTATOR CUFF REPAIR Right    UMBILICAL HERNIA REPAIR N/A 09/06/2016   Procedure: HERNIA REPAIR UMBILICAL ADULT;  Surgeon: Marshall Skeeter, MD;  Location: [facility] ORS;  Service: General;  Laterality: N/A;   Family History  Problem Relation Age of Onset   Pneumonia Mother 51   Alcohol abuse Father    Hypertension Father    Cirrhosis Father    Hypertension Brother    Stroke Paternal Grandmother    Stroke Paternal Grandfather    Social History   Socioeconomic History   Marital status: Married    Spouse name: Not on file   Number of children: 2   Years of education: Not on file   Highest education level: Not on file  Occupational History   Not on file  Tobacco Use   Smoking status: Former    Current packs/day: 0.00    Average packs/day: 1.5 packs/day for 25.0 years (37.5 ttl pk-yrs)    Types: Cigarettes    Start date: 06/27/1986    Quit date: 06/28/2011    Years since quitting: 12.6   Smokeless tobacco: Never  Vaping Use   Vaping status: Never Used  Substance and Sexual Activity   Alcohol use: Yes    Comment: on occasion   Drug use: No   Sexual  activity: Yes    Birth control/protection: None  Other Topics Concern   Not on file  Social History Narrative   Not on file   Social Drivers of Health   Financial Resource Strain: Patient Declined (02/11/2023)   Overall Financial Resource Strain (CARDIA)    Difficulty of Paying Living Expenses: Patient declined  Food Insecurity: Patient Declined (02/11/2023)   Hunger Vital Sign    Worried About Running Out of Food in the Last Year: Patient declined    Ran Out of  Food in the Last Year: Patient declined  Transportation Needs: Patient Declined (02/11/2023)   PRAPARE - Administrator, Civil Service (Medical): Patient declined    Lack of Transportation (Non-Medical): Patient declined  Physical Activity: Unknown (02/11/2023)   Exercise Vital Sign    Days of Exercise per Week: Patient declined    Minutes of Exercise per Session: Not on file  Stress: Patient Declined (02/11/2023)   [employer] of Occupational Health - Occupational Stress Questionnaire    Feeling of Stress : Patient declined  Social Connections: Unknown (02/11/2023)   Social Connection and Isolation Panel [NHANES]    Frequency of Communication with Friends and Family: Patient declined    Frequency of Social Gatherings with Friends and Family: Patient declined    Attends Religious Services: Patient declined    Database administrator or Organizations: Patient declined    Attends Banker Meetings: Not on file    Marital Status: Patient declined    Tobacco Counseling Counseling given: Not Answered   Clinical Intake:     Pain Score: 0-No pain                  Activities of Daily Living    02/21/2024    8:47 AM  In your present state of health, do you have any difficulty performing the following activities:  Hearing? 1  Vision? 0  Difficulty concentrating or making decisions? 0  Walking or climbing stairs? 0  Dressing or bathing? 0  Doing errands, shopping? 0  Preparing  Food and eating ? N  Using the Toilet? N  In the past six months, have you accidently leaked urine? N  Do you have problems with loss of bowel control? N  Managing your Medications? N  Managing your Finances? N  Housekeeping or managing your Housekeeping? N    Patient Care Team: Solomon Dupre, DO as PCP - General (Family Medicine) Boneta Busing, MD as Consulting Physician (Vascular Surgery) Carleene Chase, MD as Referring Physician (Orthopedic Surgery) Solomon Dupre, DO as Referring Physician (Family Medicine) Marquita Situ Magali Schmitz, MD (General Surgery)  Indicate any recent Medical Services you may have received from other than [facility] providers in the past year (date may be approximate).     Assessment:   This is a routine wellness examination for Andrew [name].  Hearing/Vision screen No results found.   Goals Addressed   None   Depression Screen    02/21/2024    8:19 AM 08/26/2023    8:29 AM 05/15/2023    8:40 AM 02/12/2023    8:31 AM 01/04/2023    8:50 AM 11/30/2022    1:20 PM 11/13/2022    8:07 AM  PHQ 2/9 Scores  PHQ - 2 Score 0 0 0 0 0 0 0  PHQ- 9 Score 0 0 0 0 0 2 0    Fall Risk    02/21/2024    8:19 AM 05/15/2023    8:37 AM 02/12/2023    8:31 AM 11/30/2022    1:20 PM 11/13/2022    8:08 AM  Fall Risk   Falls in the past year? 0 0 0 0 0  Number falls in past yr: 0 0 0 0 0  Injury with Fall? 0 0 0 0 0  Risk for fall due to : No Fall Risks No Fall Risks No Fall Risks No Fall Risks No Fall Risks  Follow up  Falls evaluation completed Falls evaluation completed Falls evaluation  completed Falls evaluation completed    MEDICARE RISK AT HOME: Medicare Risk at Home Any stairs in or around the home?: Yes If so, are there any without handrails?: No Home free of loose throw rugs in walkways, pet beds, electrical cords, etc?: Yes Adequate lighting in your home to reduce risk of falls?: Yes Life alert?: No Use of a cane, walker or w/c?: No Grab bars in the bathroom?:  Yes Shower chair or bench in shower?: No  TIMED UP AND GO:  Was the test performed?  Yes  Length of time to ambulate 10 feet: 10 sec Gait steady and fast without use of assistive device    Cognitive Function:        02/21/2024    8:22 AM 11/13/2022    8:51 AM  6CIT Screen  What Year? 0 points 0 points  What month? 0 points 0 points  What time? 0 points 0 points  Count back from 20 0 points 0 points  Months in reverse 0 points 0 points  Repeat phrase 0 points 2 points  Total Score 0 points 2 points    Immunizations Immunization History  Administered Date(s) Administered   Fluad Trivalent(High Dose 65+) 08/26/2023   Influenza,inj,Quad PF,6+ Mos 10/05/2015, 07/16/2016, 09/19/2017, 06/27/2018, 09/15/2019, 09/05/2020, 07/17/2021, 08/14/2022   Influenza-Unspecified 06/25/2014   Moderna Sars-Covid-2 Vaccination 06/15/2020, 07/13/2020   Pneumococcal Polysaccharide-23 12/04/2019   Td 09/16/2006   Tdap 03/28/2018    TDAP status: Up to date  Flu Vaccine status: Up to date  Pneumococcal vaccine status: Completed during today's visit.  Covid-19 vaccine status: Declined, Education has been provided regarding the importance of this vaccine but patient still declined. Advised may receive this vaccine at local pharmacy or Health Dept.or vaccine clinic. Aware to provide a copy of the vaccination record if obtained from local pharmacy or Health Dept. Verbalized acceptance and understanding.  Qualifies for Shingles Vaccine? Yes   Zostavax completed No     Screening Tests Health Maintenance  Topic Date Due   Zoster Vaccines- Shingrix (1 of 2) Never done   Pneumonia Vaccine 74+ Years old (2 of 2 - PCV) 12/03/2020   Diabetic kidney evaluation - Urine ACR  11/14/2023   Lung Cancer Screening  02/15/2024   OPHTHALMOLOGY EXAM  02/19/2024   INFLUENZA VACCINE  05/01/2024   HEMOGLOBIN A1C  05/14/2024   FOOT EXAM  08/25/2024   Colonoscopy  09/15/2024   Diabetic kidney evaluation - eGFR  measurement  09/19/2024   Medicare Annual Wellness (AWV)  02/20/2025   DTaP/Tdap/Td (3 - Td or Tdap) 03/28/2028   Hepatitis C Screening  Addressed   HPV VACCINES  Aged Out   Meningococcal B Vaccine  Aged Out   COVID-19 Vaccine  Discontinued    Health Maintenance  Health Maintenance Due  Topic Date Due   Zoster Vaccines- Shingrix (1 of 2) Never done   Pneumonia Vaccine 36+ Years old (2 of 2 - PCV) 12/03/2020   Diabetic kidney evaluation - Urine ACR  11/14/2023   Lung Cancer Screening  02/15/2024   OPHTHALMOLOGY EXAM  02/19/2024    Colorectal cancer screening: Type of screening: Colonoscopy. Completed 07/16/14. Repeat every 10 years  Lung Cancer Screening: (Low Dose CT Chest recommended if Age 34-80 years, 20 pack-year currently smoking OR have quit w/in 15years.) does qualify.   Additional Screening:  Hepatitis C Screening: does qualify; Completed 10/05/15  Vision Screening: Recommended annual ophthalmology exams for early detection of glaucoma and other disorders of the eye. Is the  patient up to date with their annual eye exam?  No  Who is the provider or what is the name of the office in which the patient attends annual eye exams? [facility]- needs to call for an appt  Dental Screening: Recommended annual dental exams for proper oral hygiene  Diabetic Foot Exam: Diabetic Foot Exam: Completed 08/26/23  Community Resource Referral / Chronic Care Management: CRR required this visit?  No   CCM required this visit?  No     Plan:     I have personally reviewed and noted the following in the patient's chart:   Medical and social history Use of alcohol, tobacco or illicit drugs  Current medications and supplements including opioid prescriptions. Patient is currently taking opioid prescriptions. Information provided to patient regarding non-opioid alternatives. Patient advised to discuss non-opioid treatment plan with their provider. Functional ability and  status Nutritional status Physical activity Advanced directives List of other physicians Hospitalizations, surgeries, and ER visits in previous 12 months Vitals Screenings to include cognitive, depression, and falls Referrals and appointments  In addition, I have reviewed and discussed with patient certain preventive protocols, quality metrics, and best practice recommendations. A written personalized care plan for preventive services as well as general preventive health recommendations were provided to patient.     Terre Ferri, DO   02/21/2024   After Visit Summary: (In Person-Printed) AVS printed and given to the patient

## 2024-02-21 NOTE — Assessment & Plan Note (Signed)
 Under good control on current regimen. Continue current regimen. Continue to monitor. Call with any concerns. Refills given for 3 months. Follow up 3 months.

## 2024-02-21 NOTE — Assessment & Plan Note (Signed)
 Under good control on current regimen. Continue current regimen. Continue to monitor. Call with any concerns. Refills given. Labs drawn today.

## 2024-02-21 NOTE — Assessment & Plan Note (Signed)
 Rechecking labs today. Await results. Treat as needed.

## 2024-02-22 LAB — LIPID PANEL W/O CHOL/HDL RATIO
Cholesterol, Total: 211 mg/dL — ABNORMAL HIGH (ref 100–199)
HDL: 37 mg/dL — ABNORMAL LOW (ref >39)
LDL Chol Calc (NIH): 135 mg/dL — ABNORMAL HIGH (ref 0–99)
Triglycerides: 219 mg/dL — ABNORMAL HIGH (ref 0–149)
VLDL Cholesterol Cal: 39 mg/dL (ref 5–40)

## 2024-02-22 LAB — COMPREHENSIVE METABOLIC PANEL WITH GFR
ALT: 38 IU/L (ref 0–44)
AST: 21 IU/L (ref 0–40)
Albumin: 4.6 g/dL (ref 3.9–4.9)
Alkaline Phosphatase: 117 IU/L (ref 44–121)
BUN/Creatinine Ratio: 24 (ref 10–24)
BUN: 25 mg/dL (ref 8–27)
Bilirubin Total: 0.5 mg/dL (ref 0.0–1.2)
CO2: 21 mmol/L (ref 20–29)
Calcium: 9.9 mg/dL (ref 8.6–10.2)
Chloride: 100 mmol/L (ref 96–106)
Creatinine, Ser: 1.06 mg/dL (ref 0.76–1.27)
Globulin, Total: 2.1 g/dL (ref 1.5–4.5)
Glucose: 217 mg/dL — ABNORMAL HIGH (ref 70–99)
Potassium: 5 mmol/L (ref 3.5–5.2)
Sodium: 136 mmol/L (ref 134–144)
Total Protein: 6.7 g/dL (ref 6.0–8.5)
eGFR: 77 mL/min/{1.73_m2} (ref >59)

## 2024-02-22 LAB — CBC WITH DIFFERENTIAL/PLATELET
Basophils Absolute: 0.1 10*3/uL (ref 0.0–0.2)
Basos: 1 %
EOS (ABSOLUTE): 0.1 10*3/uL (ref 0.0–0.4)
Eos: 1 %
Hematocrit: 43.3 % (ref 37.5–51.0)
Hemoglobin: 14.4 g/dL (ref 13.0–17.7)
Immature Grans (Abs): 0.1 10*3/uL (ref 0.0–0.1)
Immature Granulocytes: 1 %
Lymphocytes Absolute: 1.3 10*3/uL (ref 0.7–3.1)
Lymphs: 15 %
MCH: 32 pg (ref 26.6–33.0)
MCHC: 33.3 g/dL (ref 31.5–35.7)
MCV: 96 fL (ref 79–97)
Monocytes Absolute: 0.7 10*3/uL (ref 0.1–0.9)
Monocytes: 8 %
Neutrophils Absolute: 6.5 10*3/uL (ref 1.4–7.0)
Neutrophils: 74 %
Platelets: 168 10*3/uL (ref 150–450)
RBC: 4.5 x10E6/uL (ref 4.14–5.80)
RDW: 14 % (ref 11.6–15.4)
WBC: 8.7 10*3/uL (ref 3.4–10.8)

## 2024-02-22 LAB — TSH: TSH: 4.1 u[IU]/mL (ref 0.450–4.500)

## 2024-02-22 LAB — TESTOSTERONE, FREE, TOTAL, SHBG
Sex Hormone Binding: 33.2 nmol/L (ref 19.3–76.4)
Testosterone, Free: 2.3 pg/mL — ABNORMAL LOW (ref 6.6–18.1)
Testosterone: 232 ng/dL — ABNORMAL LOW (ref 264–916)

## 2024-02-22 LAB — PSA: Prostate Specific Ag, Serum: 0.8 ng/mL (ref 0.0–4.0)

## 2024-02-25 ENCOUNTER — Other Ambulatory Visit: Payer: Self-pay

## 2024-02-25 ENCOUNTER — Ambulatory Visit: Payer: Self-pay | Admitting: Family Medicine

## 2024-02-25 DIAGNOSIS — Z122 Encounter for screening for malignant neoplasm of respiratory organs: Secondary | ICD-10-CM

## 2024-02-25 DIAGNOSIS — Z87891 Personal history of nicotine dependence: Secondary | ICD-10-CM

## 2024-02-25 MED ORDER — TESTOSTERONE CYPIONATE 100 MG/ML IM SOLN: 150.0000 mg | INTRAMUSCULAR | 2 refills | Status: DC

## 2024-03-24 ENCOUNTER — Other Ambulatory Visit

## 2024-04-16 ENCOUNTER — Other Ambulatory Visit (HOSPITAL_COMMUNITY): Payer: Self-pay

## 2024-04-28 DIAGNOSIS — N400 Enlarged prostate without lower urinary tract symptoms: Secondary | ICD-10-CM | POA: Diagnosis not present

## 2024-04-28 DIAGNOSIS — E1165 Type 2 diabetes mellitus with hyperglycemia: Secondary | ICD-10-CM | POA: Diagnosis not present

## 2024-04-28 DIAGNOSIS — G8929 Other chronic pain: Secondary | ICD-10-CM | POA: Diagnosis not present

## 2024-04-28 DIAGNOSIS — I10 Essential (primary) hypertension: Secondary | ICD-10-CM | POA: Diagnosis not present

## 2024-05-25 ENCOUNTER — Ambulatory Visit (INDEPENDENT_AMBULATORY_CARE_PROVIDER_SITE_OTHER): Admitting: Family Medicine

## 2024-05-25 ENCOUNTER — Encounter: Payer: Self-pay | Admitting: Family Medicine

## 2024-05-25 VITALS — BP 137/84 | HR 54 | Temp 97.7°F | Ht 68.0 in | Wt 245.8 lb

## 2024-05-25 DIAGNOSIS — E291 Testicular hypofunction: Secondary | ICD-10-CM

## 2024-05-25 DIAGNOSIS — E1129 Type 2 diabetes mellitus with other diabetic kidney complication: Secondary | ICD-10-CM | POA: Diagnosis not present

## 2024-05-25 DIAGNOSIS — R809 Proteinuria, unspecified: Secondary | ICD-10-CM | POA: Diagnosis not present

## 2024-05-25 DIAGNOSIS — M4726 Other spondylosis with radiculopathy, lumbar region: Secondary | ICD-10-CM

## 2024-05-25 LAB — BAYER DCA HB A1C WAIVED: HB A1C (BAYER DCA - WAIVED): 7.9 % — ABNORMAL HIGH (ref 4.8–5.6)

## 2024-05-25 MED ORDER — ROSUVASTATIN CALCIUM 5 MG PO TABS: 5.0000 mg | ORAL_TABLET | ORAL | 0 refills | Status: AC

## 2024-05-25 MED ORDER — RYBELSUS 7 MG PO TABS: 7.0000 mg | ORAL_TABLET | Freq: Every day | ORAL | 1 refills | Status: DC

## 2024-05-25 MED ORDER — TRAMADOL HCL 50 MG PO TABS: 50.0000 mg | ORAL_TABLET | Freq: Two times a day (BID) | ORAL | 2 refills | Status: DC

## 2024-05-25 MED ORDER — RYBELSUS 3 MG PO TABS: 3.0000 mg | ORAL_TABLET | Freq: Every day | ORAL | Status: DC

## 2024-05-25 MED ORDER — TESTOSTERONE CYPIONATE 100 MG/ML IM SOLN: 150.0000 mg | INTRAMUSCULAR | 2 refills | Status: DC

## 2024-05-25 NOTE — Progress Notes (Signed)
 BP 137/84   Pulse (!) 54   Temp 97.7 F (36.5 C) (Oral)   Ht 5' 8 (1.727 m)   Wt 245 lb 12.8 oz (111.5 kg)   SpO2 94%   BMI 37.37 kg/m    Subjective:    Patient ID: Andrew [name], male    DOB: 02/26/1956, 68 y.o.   MRN: [id number]  HPI: Andrew [name] is a 68 y.o. male  Chief Complaint  Patient presents with   Diabetes   Skin Problem    Concerns of bruising very easily.     DIABETES Hypoglycemic episodes:no Polydipsia/polyuria: no Visual disturbance: no Chest pain: no Paresthesias: no Glucose Monitoring: yes Taking Insulin?: no Blood Pressure Monitoring: not checking Retinal Examination: Not up to Date Foot Exam: Up to Date Diabetic Education: Completed Pneumovax: Up to Date Influenza: Not up to Date Aspirin : yes  LOW TESTOSTERONE  Duration: chronic Status: controlled  Satisfied with current treatment:  yes Medication side effects:  no Medication compliance: excellent compliance Decreased libido: no Fatigue: no Depressed mood: no Muscle weakness: no Erectile dysfunction: no  CHRONIC PAIN  Present dose: 10 Morphine equivalents Pain control status: controlled Duration: chronic Location: low back pain Quality: aching and sore Current Pain Level: mild Previous Pain Level: moderate Breakthrough pain: no Benefit from narcotic medications: yes What Activities task can be accomplished with current medication?- able to do his ADLs Interested in weaning off narcotics:no   Stool softners/OTC fiber: no  Previous pain specialty evaluation: yes Non-narcotic analgesic meds: yes Narcotic contract: yes   Relevant past medical, surgical, family and social history reviewed and updated as indicated. Interim medical history since our last visit reviewed. Allergies and medications reviewed and updated.  Review of Systems  Constitutional: Negative.   Respiratory: Negative.    Cardiovascular: Negative.   Musculoskeletal: Negative.   Neurological: Negative.    Psychiatric/Behavioral: Negative.      Per HPI unless specifically indicated above     Objective:    BP 137/84   Pulse (!) 54   Temp 97.7 F (36.5 C) (Oral)   Ht 5' 8 (1.727 m)   Wt 245 lb 12.8 oz (111.5 kg)   SpO2 94%   BMI 37.37 kg/m   Wt Readings from Last 3 Encounters:  05/25/24 245 lb 12.8 oz (111.5 kg)  02/21/24 247 lb (112 kg)  11/15/23 250 lb (113.4 kg)    Physical Exam Vitals and nursing note reviewed.  Constitutional:      General: He is not in acute distress.    Appearance: Normal appearance. He is obese. He is not ill-appearing, toxic-appearing or diaphoretic.  HENT:     Head: Normocephalic and atraumatic.     Right Ear: External ear normal.     Left Ear: External ear normal.     Nose: Nose normal.     Mouth/Throat:     Mouth: Mucous membranes are moist.     Pharynx: Oropharynx is clear.  Eyes:     General: No scleral icterus.       Right eye: No discharge.        Left eye: No discharge.     Extraocular Movements: Extraocular movements intact.     Conjunctiva/sclera: Conjunctivae normal.     Pupils: Pupils are equal, round, and reactive to light.  Cardiovascular:     Rate and Rhythm: Normal rate and regular rhythm.     Pulses: Normal pulses.     Heart sounds: Normal heart sounds. No murmur heard.  No friction rub. No gallop.  Pulmonary:     Effort: Pulmonary effort is normal. No respiratory distress.     Breath sounds: Normal breath sounds. No stridor. No wheezing, rhonchi or rales.  Chest:     Chest wall: No tenderness.  Musculoskeletal:        General: Normal range of motion.     Cervical back: Normal range of motion and neck supple.  Skin:    General: Skin is warm and dry.     Capillary Refill: Capillary refill takes less than 2 seconds.     Coloration: Skin is not jaundiced or pale.     Findings: No bruising, erythema, lesion or rash.  Neurological:     General: No focal deficit present.     Mental Status: He is alert and oriented to  person, place, and time. Mental status is at baseline.  Psychiatric:        Mood and Affect: Mood normal.        Behavior: Behavior normal.        Thought Content: Thought content normal.        Judgment: Judgment normal.     Results for orders placed or performed in visit on 05/25/24  Bayer DCA Hb A1c Waived   Collection Time: 05/25/24  9:42 AM  Result Value Ref Range   HB A1C (BAYER DCA - WAIVED) 7.9 (H) 4.8 - 5.6 %      Assessment & Plan:   Problem List Items Addressed This Visit       Endocrine   Hypogonadism in male   Under good control on current regimen. Continue current regimen. Continue to monitor. Call with any concerns. Refills given.        Controlled type 2 diabetes mellitus with microalbuminuria (HCC) - Primary   Not doing well with A1c up to 7.9 from 7.2. Will start rybelsus  and recheck in 3 months. Call with any concerns.       Relevant Medications   Semaglutide  (RYBELSUS ) 3 MG TABS   Semaglutide  (RYBELSUS ) 7 MG TABS   rosuvastatin  (CRESTOR ) 5 MG tablet   Other Relevant Orders   Bayer DCA Hb A1c Waived (Completed)     Musculoskeletal and Integument   Degenerative joint disease (DJD) of lumbar spine   Under good control on current regimen. Continue current regimen. Continue to monitor. Call with any concerns. Refills given for 3 months. Follow up 3 months.          Follow up plan: Return in about 3 months (around 08/25/2024) for physical.

## 2024-05-25 NOTE — Assessment & Plan Note (Signed)
 Under good control on current regimen. Continue current regimen. Continue to monitor. Call with any concerns. Refills given.

## 2024-05-25 NOTE — Assessment & Plan Note (Signed)
 Not doing well with A1c up to 7.9 from 7.2. Will start rybelsus  and recheck in 3 months. Call with any concerns.

## 2024-05-25 NOTE — Assessment & Plan Note (Signed)
 Under good control on current regimen. Continue current regimen. Continue to monitor. Call with any concerns. Refills given for 3 months. Follow up 3 months.

## 2024-05-28 ENCOUNTER — Telehealth: Payer: Self-pay

## 2024-05-28 ENCOUNTER — Other Ambulatory Visit (HOSPITAL_COMMUNITY): Payer: Self-pay

## 2024-05-28 DIAGNOSIS — M533 Sacrococcygeal disorders, not elsewhere classified: Secondary | ICD-10-CM | POA: Diagnosis not present

## 2024-05-28 MED ORDER — RYBELSUS 3 MG PO TABS: 3.0000 mg | ORAL_TABLET | Freq: Every day | ORAL | Status: DC

## 2024-05-28 NOTE — Addendum Note (Signed)
 Addended by: Maki Hege T on: 05/28/2024 11:51 AM   Modules accepted: Orders

## 2024-05-28 NOTE — Telephone Encounter (Signed)
 Pharmacy Patient Advocate Encounter  Received notification from [facility] ADVANT[employer]/RX ADVANCE that Prior Authorization for Rybelsus  7MG  tablets  has been APPROVED from 05/28/24 to 05/28/25. Ran test claim, Copay is $0. This test claim was processed through [facility]- copay amounts may vary at other pharmacies due to pharmacy/plan contracts, or as the patient moves through the different stages of their insurance plan.   PA #/Case ID/Reference #: [id number]

## 2024-06-25 ENCOUNTER — Other Ambulatory Visit: Payer: Self-pay

## 2024-06-25 MED ORDER — METFORMIN HCL ER 500 MG PO TB24: 1000.0000 mg | ORAL_TABLET | Freq: Every day | ORAL | 0 refills | Status: DC

## 2024-08-06 ENCOUNTER — Other Ambulatory Visit: Payer: Self-pay

## 2024-08-07 MED ORDER — BUPROPION HCL ER (SR) 200 MG PO TB12: 200.0000 mg | ORAL_TABLET | Freq: Two times a day (BID) | ORAL | 1 refills | Status: DC

## 2024-08-13 ENCOUNTER — Other Ambulatory Visit: Payer: Self-pay

## 2024-08-18 ENCOUNTER — Ambulatory Visit: Admitting: Family Medicine

## 2024-08-18 ENCOUNTER — Other Ambulatory Visit: Payer: Self-pay | Admitting: Family Medicine

## 2024-08-19 ENCOUNTER — Other Ambulatory Visit: Payer: Self-pay | Admitting: Family Medicine

## 2024-08-19 MED ORDER — DICLOFENAC SODIUM 75 MG PO TBEC: 75.0000 mg | DELAYED_RELEASE_TABLET | Freq: Two times a day (BID) | ORAL | 1 refills | Status: AC

## 2024-08-19 NOTE — Telephone Encounter (Signed)
 Patient is due for an appointment. Looks like yesterday's appointment was cancelled. Please call to schedule and then route to provider for refill.

## 2024-08-20 MED ORDER — GABAPENTIN 300 MG PO CAPS: 300.0000 mg | ORAL_CAPSULE | Freq: Three times a day (TID) | ORAL | 0 refills | Status: DC

## 2024-08-21 ENCOUNTER — Other Ambulatory Visit: Payer: Self-pay | Admitting: Family Medicine

## 2024-08-26 ENCOUNTER — Other Ambulatory Visit: Payer: Self-pay | Admitting: Family Medicine

## 2024-08-26 MED ORDER — HYDROCHLOROTHIAZIDE 12.5 MG PO CAPS: 12.5000 mg | ORAL_CAPSULE | Freq: Every day | ORAL | 0 refills | Status: DC

## 2024-08-26 NOTE — Telephone Encounter (Signed)
 Copied from CRM #[id number]. Topic: Clinical - Medication Refill >> Aug 26, 2024  9:56 AM Tysheama G wrote: Medication: metoprolol  succinate (TOPROL -XL) 100 MG 24 hr tablet  Has the patient contacted their pharmacy? Yes (Agent: If no, request that the patient contact the pharmacy for the refill. If patient does not wish to contact the pharmacy document the reason why and proceed with request.) (Agent: If yes, when and what did the pharmacy advise?)  This is the patient's preferred pharmacy:   [facility]) [place]  - [place], [place] - [id number] [facility] [address] [place] [address] Phone: [phone] Fax: [phone]  Is this the correct pharmacy for this prescription? Yes If no, delete pharmacy and type the correct one.   Has the prescription been filled recently? No  Is the patient out of the medication? No  Has the patient been seen for an appointment in the last year OR does the patient have an upcoming appointment? Yes  Can we respond through MyChart? Yes  Agent: Please be advised that Rx refills may take up to 3 business days. We ask that you follow-up with your pharmacy.

## 2024-08-31 ENCOUNTER — Telehealth: Payer: Self-pay | Admitting: Family Medicine

## 2024-08-31 MED ORDER — METOPROLOL SUCCINATE ER 100 MG PO TB24: 100.0000 mg | ORAL_TABLET | Freq: Every day | ORAL | 0 refills | Status: DC

## 2024-08-31 NOTE — Telephone Encounter (Signed)
 Birdi is Requesting authorization of   tadalafil  (CIALIS ) 5 MG tablet [[id number]]   Order Details Dose, Route, Frequency: As Directed  Dispense Quantity: 90 tablet Refills: 1   Duration: -- Dispense As Written: No        Sig: TAKE 1 TABLET(5 MG) BY MOUTH DAILY

## 2024-08-31 NOTE — Telephone Encounter (Signed)
 Requested Prescriptions  Pending Prescriptions Disp Refills   metoprolol  succinate (TOPROL -XL) 100 MG 24 hr tablet 90 tablet 0    Sig: Take 1 tablet (100 mg total) by mouth daily.     Cardiovascular:  Beta Blockers Passed - 08/31/2024 12:18 PM      Passed - Last BP in normal range    BP Readings from Last 1 Encounters:  05/25/24 137/84         Passed - Last Heart Rate in normal range    Pulse Readings from Last 1 Encounters:  05/25/24 (!) 54         Passed - Valid encounter within last 6 months    Recent Outpatient Visits           3 months ago Controlled type 2 diabetes mellitus with microalbuminuria, without long-term current use of insulin (HCC)   [facility] [place], Megan P, DO   6 months ago Encounter for annual wellness exam in Medicare patient   [place] [place], Megan P, DO   9 months ago Controlled type 2 diabetes mellitus with microalbuminuria, without long-term current use of insulin [facility])   [facility] [place] [facility] [place], [place], DO

## 2024-09-01 DIAGNOSIS — M533 Sacrococcygeal disorders, not elsewhere classified: Secondary | ICD-10-CM | POA: Diagnosis not present

## 2024-09-01 MED ORDER — TADALAFIL 5 MG PO TABS: ORAL_TABLET | ORAL | 0 refills | Status: DC

## 2024-09-08 LAB — OPHTHALMOLOGY REPORT-SCANNED

## 2024-09-11 ENCOUNTER — Other Ambulatory Visit: Payer: Self-pay

## 2024-09-11 ENCOUNTER — Ambulatory Visit (INDEPENDENT_AMBULATORY_CARE_PROVIDER_SITE_OTHER): Admitting: Family Medicine

## 2024-09-11 ENCOUNTER — Encounter: Payer: Self-pay | Admitting: Family Medicine

## 2024-09-11 VITALS — BP 124/83 | HR 55 | Temp 97.9°F | Ht 68.0 in | Wt 242.4 lb

## 2024-09-11 DIAGNOSIS — Z23 Encounter for immunization: Secondary | ICD-10-CM

## 2024-09-11 DIAGNOSIS — E291 Testicular hypofunction: Secondary | ICD-10-CM

## 2024-09-11 DIAGNOSIS — R809 Proteinuria, unspecified: Secondary | ICD-10-CM | POA: Diagnosis not present

## 2024-09-11 DIAGNOSIS — M4726 Other spondylosis with radiculopathy, lumbar region: Secondary | ICD-10-CM

## 2024-09-11 DIAGNOSIS — E1129 Type 2 diabetes mellitus with other diabetic kidney complication: Secondary | ICD-10-CM

## 2024-09-11 DIAGNOSIS — Z1211 Encounter for screening for malignant neoplasm of colon: Secondary | ICD-10-CM

## 2024-09-11 DIAGNOSIS — I129 Hypertensive chronic kidney disease with stage 1 through stage 4 chronic kidney disease, or unspecified chronic kidney disease: Secondary | ICD-10-CM

## 2024-09-11 LAB — BAYER DCA HB A1C WAIVED: HB A1C (BAYER DCA - WAIVED): 7.8 % — ABNORMAL HIGH (ref 4.8–5.6)

## 2024-09-11 MED ORDER — GABAPENTIN 300 MG PO CAPS: 300.0000 mg | ORAL_CAPSULE | Freq: Three times a day (TID) | ORAL | 1 refills | Status: AC

## 2024-09-11 MED ORDER — TRAMADOL HCL 50 MG PO TABS: 50.0000 mg | ORAL_TABLET | Freq: Two times a day (BID) | ORAL | 2 refills | Status: AC

## 2024-09-11 MED ORDER — METFORMIN HCL ER 500 MG PO TB24: 1000.0000 mg | ORAL_TABLET | Freq: Two times a day (BID) | ORAL | 1 refills | Status: AC

## 2024-09-11 MED ORDER — TIRZEPATIDE 5 MG/0.5ML ~~LOC~~ SOAJ
5.0000 mg | SUBCUTANEOUS | 1 refills | Status: AC
  Filled 2024-09-11 – 2024-10-07 (×2): qty 2, 28d supply, fill #0
  Filled 2024-10-28: qty 2, 28d supply, fill #1

## 2024-09-11 MED ORDER — BENAZEPRIL HCL 40 MG PO TABS: 40.0000 mg | ORAL_TABLET | Freq: Every day | ORAL | 1 refills | Status: AC

## 2024-09-11 MED ORDER — BUPROPION HCL ER (SR) 200 MG PO TB12: 200.0000 mg | ORAL_TABLET | Freq: Two times a day (BID) | ORAL | 1 refills | Status: AC

## 2024-09-11 MED ORDER — TADALAFIL 5 MG PO TABS: ORAL_TABLET | ORAL | 1 refills | Status: AC

## 2024-09-11 MED ORDER — METOPROLOL SUCCINATE ER 100 MG PO TB24: 100.0000 mg | ORAL_TABLET | Freq: Every day | ORAL | 1 refills | Status: AC

## 2024-09-11 MED ORDER — HYDROCHLOROTHIAZIDE 12.5 MG PO CAPS: 12.5000 mg | ORAL_CAPSULE | Freq: Every day | ORAL | 1 refills | Status: AC

## 2024-09-11 MED ORDER — TIRZEPATIDE 2.5 MG/0.5ML ~~LOC~~ SOAJ: 2.5000 mg | SUBCUTANEOUS | Status: AC

## 2024-09-11 MED ORDER — ASPIRIN 81 MG PO TBEC: 81.0000 mg | DELAYED_RELEASE_TABLET | Freq: Every day | ORAL | 3 refills | Status: AC

## 2024-09-11 NOTE — Assessment & Plan Note (Signed)
 Under good control on current regimen. Continue current regimen. Continue to monitor. Call with any concerns. Refills given for 3 months. Follow up 3 months.

## 2024-09-11 NOTE — Assessment & Plan Note (Signed)
 Stable with A1c of 7.8. Will start mounjaro and recheck in 3 months. Call with any concerns.

## 2024-09-11 NOTE — Progress Notes (Signed)
 BP 124/83 (BP Location: Left Arm, Cuff Size: Large)   Pulse (!) 55   Temp 97.9 F (36.6 C) (Oral)   Ht 5' 8 (1.727 m)   Wt 242 lb 6.4 oz (110 kg)   SpO2 96%   BMI 36.86 kg/m    Subjective:    Patient ID: Andrew [name], male    DOB: 05-23-1956, 68 y.o.   MRN: [id number]  HPI: Andrew [name] is a 68 y.o. male  Chief Complaint  Patient presents with   Diabetes    This morning 189 fasting    Medication Refill   DIABETES- had a steroid shot about 2 weeks so he increased his metformin  2 weeks  Hypoglycemic episodes:no Polydipsia/polyuria: yes Visual disturbance: no Chest pain: no Paresthesias: no Glucose Monitoring: yes  Accucheck frequency: Daily  Fasting glucose: 280s, today 189 Taking Insulin?: no Blood Pressure Monitoring: rarely Retinal Examination: Up to Date Foot Exam: Up to Date Diabetic Education: Completed Pneumovax: Up to Date Influenza: Up to Date Aspirin : yes  HYPERTENSION / HYPERLIPIDEMIA Satisfied with current treatment? yes Duration of hypertension: chronic BP monitoring frequency: not checking BP medication side effects: no Past BP meds: metoprolol , benazepril , hctz Duration of hyperlipidemia: chronic Cholesterol medication side effects: no Cholesterol supplements: none Past cholesterol medications: crestor  Medication compliance: excellent compliance Aspirin : yes Recent stressors: no Recurrent headaches: no Visual changes: no Palpitations: no Dyspnea: no Chest pain: no Lower extremity edema: no Dizzy/lightheaded: no  LOW TESTOSTERONE  Duration: chronic Status: stable  Satisfied with current treatment:  yes Previous testosterone  therapies: androgel , IM testosterone  Medication side effects:  no Medication compliance: excellent compliance Decreased libido: no Fatigue: no Depressed mood: no Muscle weakness: no Erectile dysfunction: no  DEPRESSION Mood status: controlled Satisfied with current treatment?: yes Symptom severity:  mild  Duration of current treatment : chronic Side effects: no Medication compliance: excellent compliance Psychotherapy/counseling: no  Previous psychiatric medications: wellbutrin  Depressed mood: no Anxious mood: no Anhedonia: no Significant weight loss or gain: no Insomnia: no  Fatigue: yes Feelings of worthlessness or guilt: no Impaired concentration/indecisiveness: no Suicidal ideations: no Hopelessness: no Crying spells: no    09/11/2024    8:36 AM 02/21/2024    8:19 AM 08/26/2023    8:29 AM 05/15/2023    8:40 AM 02/12/2023    8:31 AM  Depression screen PHQ 2/9  Decreased Interest 0 0 0 0 0  Down, Depressed, Hopeless 0 0 0 0 0  PHQ - 2 Score 0 0 0 0 0  Altered sleeping 0 0 0 0 0  Tired, decreased energy 0 0 0 0 0  Change in appetite 0 0 0 0 0  Feeling bad or failure about yourself  0 0 0 0 0  Trouble concentrating 0 0 0 0 0  Moving slowly or fidgety/restless 0 0 0 0 0  Suicidal thoughts 0 0 0 0 0  PHQ-9 Score 0 0  0  0  0   Difficult doing work/chores Not difficult at all Not difficult at all Not difficult at all Not difficult at all Not difficult at all     Data saved with a previous flowsheet row definition   CHRONIC PAIN  Present dose: 10 Morphine equivalents Pain control status: controlled Duration: chronic Location: low back pain Quality: aching and sore Current Pain Level: mild Previous Pain Level: moderate Breakthrough pain: no Benefit from narcotic medications: yes What Activities task can be accomplished with current medication?- able to do his ADLs Interested in weaning off  narcotics:no   Stool softners/OTC fiber: no  Previous pain specialty evaluation: yes Non-narcotic analgesic meds: yes Narcotic contract: yes   Relevant past medical, surgical, family and social history reviewed and updated as indicated. Interim medical history since our last visit reviewed. Allergies and medications reviewed and updated.  Review of Systems  Constitutional:  Negative.   Respiratory: Negative.    Cardiovascular: Negative.   Musculoskeletal: Negative.   Neurological: Negative.   Psychiatric/Behavioral: Negative.      Per HPI unless specifically indicated above     Objective:    BP 124/83 (BP Location: Left Arm, Cuff Size: Large)   Pulse (!) 55   Temp 97.9 F (36.6 C) (Oral)   Ht 5' 8 (1.727 m)   Wt 242 lb 6.4 oz (110 kg)   SpO2 96%   BMI 36.86 kg/m   Wt Readings from Last 3 Encounters:  09/11/24 242 lb 6.4 oz (110 kg)  05/25/24 245 lb 12.8 oz (111.5 kg)  02/21/24 247 lb (112 kg)    Physical Exam Vitals and nursing note reviewed.  Constitutional:      General: He is not in acute distress.    Appearance: Normal appearance. He is not ill-appearing, toxic-appearing or diaphoretic.  HENT:     Head: Normocephalic and atraumatic.     Right Ear: External ear normal.     Left Ear: External ear normal.     Nose: Nose normal.     Mouth/Throat:     Mouth: Mucous membranes are moist.     Pharynx: Oropharynx is clear.  Eyes:     General: No scleral icterus.       Right eye: No discharge.        Left eye: No discharge.     Extraocular Movements: Extraocular movements intact.     Conjunctiva/sclera: Conjunctivae normal.     Pupils: Pupils are equal, round, and reactive to light.  Cardiovascular:     Rate and Rhythm: Normal rate and regular rhythm.     Pulses: Normal pulses.     Heart sounds: Normal heart sounds. No murmur heard.    No friction rub. No gallop.  Pulmonary:     Effort: Pulmonary effort is normal. No respiratory distress.     Breath sounds: Normal breath sounds. No stridor. No wheezing, rhonchi or rales.  Chest:     Chest wall: No tenderness.  Musculoskeletal:        General: Normal range of motion.     Cervical back: Normal range of motion and neck supple.  Skin:    General: Skin is warm and dry.     Capillary Refill: Capillary refill takes less than 2 seconds.     Coloration: Skin is not jaundiced or pale.      Findings: No bruising, erythema, lesion or rash.  Neurological:     General: No focal deficit present.     Mental Status: He is alert and oriented to person, place, and time. Mental status is at baseline.  Psychiatric:        Mood and Affect: Mood normal.        Behavior: Behavior normal.        Thought Content: Thought content normal.        Judgment: Judgment normal.     Results for orders placed or performed in visit on 09/11/24  Bayer DCA Hb A1c Waived   Collection Time: 09/11/24  8:43 AM  Result Value Ref Range   HB A1C (BAYER DCA -  WAIVED) 7.8 (H) 4.8 - 5.6 %      Assessment & Plan:   Problem List Items Addressed This Visit       Endocrine   Hypogonadism in male   Has only been taking 50mg  every 2 weeks- rechecking labs today. Await results. Treat as needed.       Relevant Orders   CBC with Differential/Platelet   Comprehensive metabolic panel with GFR   Testosterone , free, total([facility])   Controlled type 2 diabetes mellitus with microalbuminuria, without long-term current use of insulin (HCC) - Primary   Stable with A1c of 7.8. Will start mounjaro and recheck in 3 months. Call with any concerns.       Relevant Medications   tirzepatide (MOUNJARO) 2.5 MG/0.5ML Pen   tirzepatide (MOUNJARO) 5 MG/0.5ML Pen   aspirin  EC 81 MG tablet   benazepril  (LOTENSIN ) 40 MG tablet   metFORMIN  (GLUCOPHAGE -XR) 500 MG 24 hr tablet   Other Relevant Orders   Bayer DCA Hb A1c Waived (Completed)   CBC with Differential/Platelet   Comprehensive metabolic panel with GFR   Lipid Panel w/o Chol/HDL Ratio     Musculoskeletal and Integument   Degenerative joint disease (DJD) of lumbar spine   Under good control on current regimen. Continue current regimen. Continue to monitor. Call with any concerns. Refills given for 3 months. Follow up 3 months.        Relevant Medications   aspirin  EC 81 MG tablet   traMADol  (ULTRAM ) 50 MG tablet     Genitourinary   Benign  hypertensive renal disease   Under good control on current regimen. Continue current regimen. Continue to monitor. Call with any concerns. Refills given. Labs drawn today.        Relevant Orders   CBC with Differential/Platelet   Comprehensive metabolic panel with GFR   Other Visit Diagnoses       Screening for colon cancer       Referral to GI placed today.   Relevant Orders   Ambulatory referral to Gastroenterology     Needs flu shot       Flu shot given today.   Relevant Orders   Flu vaccine HIGH DOSE PF(Fluzone Trivalent) (Completed)        Follow up plan: Return in about 3 months (around 12/10/2024).

## 2024-09-11 NOTE — Assessment & Plan Note (Signed)
 Under good control on current regimen. Continue current regimen. Continue to monitor. Call with any concerns. Refills given. Labs drawn today.

## 2024-09-11 NOTE — Assessment & Plan Note (Signed)
 Has only been taking 50mg  every 2 weeks- rechecking labs today. Await results. Treat as needed.

## 2024-09-13 LAB — COMPREHENSIVE METABOLIC PANEL WITH GFR
ALT: 34 IU/L (ref 0–44)
AST: 23 IU/L (ref 0–40)
Albumin: 4.3 g/dL (ref 3.9–4.9)
Alkaline Phosphatase: 85 IU/L (ref 47–123)
BUN/Creatinine Ratio: 25 — ABNORMAL HIGH (ref 10–24)
BUN: 42 mg/dL — ABNORMAL HIGH (ref 8–27)
Bilirubin Total: 0.7 mg/dL (ref 0.0–1.2)
CO2: 22 mmol/L (ref 20–29)
Calcium: 9.9 mg/dL (ref 8.6–10.2)
Chloride: 100 mmol/L (ref 96–106)
Creatinine, Ser: 1.7 mg/dL — ABNORMAL HIGH (ref 0.76–1.27)
Globulin, Total: 1.8 g/dL (ref 1.5–4.5)
Glucose: 194 mg/dL — ABNORMAL HIGH (ref 70–99)
Potassium: 4.7 mmol/L (ref 3.5–5.2)
Sodium: 136 mmol/L (ref 134–144)
Total Protein: 6.1 g/dL (ref 6.0–8.5)
eGFR: 44 mL/min/1.73 — ABNORMAL LOW (ref >59)

## 2024-09-13 LAB — LIPID PANEL W/O CHOL/HDL RATIO
Cholesterol, Total: 202 mg/dL — ABNORMAL HIGH (ref 100–199)
HDL: 33 mg/dL — ABNORMAL LOW (ref >39)
LDL Chol Calc (NIH): 135 mg/dL — ABNORMAL HIGH (ref 0–99)
Triglycerides: 187 mg/dL — ABNORMAL HIGH (ref 0–149)
VLDL Cholesterol Cal: 34 mg/dL (ref 5–40)

## 2024-09-13 LAB — TESTOSTERONE, FREE, TOTAL, SHBG
Sex Hormone Binding: 19.9 nmol/L (ref 19.3–76.4)
Testosterone, Free: 10.8 pg/mL (ref 6.6–18.1)
Testosterone: 491 ng/dL (ref 264–916)

## 2024-09-13 LAB — CBC WITH DIFFERENTIAL/PLATELET
Basophils Absolute: 0 x10E3/uL (ref 0.0–0.2)
Basos: 1 %
EOS (ABSOLUTE): 0.1 x10E3/uL (ref 0.0–0.4)
Eos: 2 %
Hematocrit: 46.4 % (ref 37.5–51.0)
Hemoglobin: 15.7 g/dL (ref 13.0–17.7)
Immature Grans (Abs): 0.1 x10E3/uL (ref 0.0–0.1)
Immature Granulocytes: 1 %
Lymphocytes Absolute: 1.8 x10E3/uL (ref 0.7–3.1)
Lymphs: 22 %
MCH: 32.4 pg (ref 26.6–33.0)
MCHC: 33.8 g/dL (ref 31.5–35.7)
MCV: 96 fL (ref 79–97)
Monocytes Absolute: 0.7 x10E3/uL (ref 0.1–0.9)
Monocytes: 9 %
Neutrophils Absolute: 5.4 x10E3/uL (ref 1.4–7.0)
Neutrophils: 65 %
Platelets: 156 x10E3/uL (ref 150–450)
RBC: 4.84 x10E6/uL (ref 4.14–5.80)
RDW: 14.4 % (ref 11.6–15.4)
WBC: 8.2 x10E3/uL (ref 3.4–10.8)

## 2024-09-14 ENCOUNTER — Ambulatory Visit: Payer: Self-pay | Admitting: Family Medicine

## 2024-09-14 DIAGNOSIS — N289 Disorder of kidney and ureter, unspecified: Secondary | ICD-10-CM

## 2024-09-14 MED ORDER — TESTOSTERONE CYPIONATE 100 MG/ML IM SOLN: 150.0000 mg | INTRAMUSCULAR | 2 refills | Status: AC

## 2024-09-15 ENCOUNTER — Telehealth: Payer: Self-pay | Admitting: Pharmacy Technician

## 2024-09-15 ENCOUNTER — Other Ambulatory Visit (HOSPITAL_COMMUNITY): Payer: Self-pay

## 2024-09-15 ENCOUNTER — Telehealth: Payer: Self-pay

## 2024-09-15 ENCOUNTER — Other Ambulatory Visit: Payer: Self-pay

## 2024-09-15 DIAGNOSIS — Z1211 Encounter for screening for malignant neoplasm of colon: Secondary | ICD-10-CM

## 2024-09-15 MED ORDER — NA SULFATE-K SULFATE-MG SULF 17.5-3.13-1.6 GM/177ML PO SOLN: 1.0000 | Freq: Once | ORAL | 0 refills | Status: AC

## 2024-09-15 NOTE — Telephone Encounter (Signed)
 Gastroenterology Pre-Procedure Review  Request Date: 01/07/25 Requesting Physician: Dr. Jinny  PATIENT REVIEW QUESTIONS: The patient responded to the following health history questions as indicated:    1. Are you having any GI issues? no 2. Do you have a personal history of Polyps? no 3. Do you have a family history of Colon Cancer or Polyps? no 4. Diabetes Mellitus? yes (takes Mounjaro  and Metformin  has been advised to stop Metformin  2 days prior and stop Mounjaro  7 days prior) 5. Joint replacements in the past 12 months?no 6. Major health problems in the past 3 months?no 7. Any artificial heart valves, MVP, or defibrillator?no    MEDICATIONS & ALLERGIES:    Patient reports the following regarding taking any anticoagulation/antiplatelet therapy:   Plavix, Coumadin, Eliquis, Xarelto, Lovenox, Pradaxa, Brilinta, or Effient? no Aspirin ? yes (81 mg daily)  Patient confirms/reports the following medications:  Current Outpatient Medications  Medication Sig Dispense Refill   aspirin  EC 81 MG tablet Take 1 tablet (81 mg total) by mouth daily. Swallow whole. 90 tablet 3   benazepril  (LOTENSIN ) 40 MG tablet Take 1 tablet (40 mg total) by mouth daily. 90 tablet 1   Blood Glucose Monitoring Suppl (ONETOUCH VERIO) w/Device KIT 1 kit by Does not apply route daily. 1 kit 0   buPROPion  (WELLBUTRIN  SR) 200 MG 12 hr tablet Take 1 tablet (200 mg total) by mouth 2 (two) times daily. 180 tablet 1   diclofenac  (VOLTAREN ) 75 MG EC tablet Take 1 tablet (75 mg total) by mouth 2 (two) times daily. 180 tablet 1   gabapentin  (NEURONTIN ) 300 MG capsule Take 1 capsule (300 mg total) by mouth 3 (three) times daily. 180 capsule 1   glucose blood test strip Use as instructed 100 each 12   hydrochlorothiazide  (MICROZIDE ) 12.5 MG capsule Take 1 capsule (12.5 mg total) by mouth daily. 90 capsule 1   metFORMIN  (GLUCOPHAGE -XR) 500 MG 24 hr tablet Take 2 tablets (1,000 mg total) by mouth 2 (two) times daily with a meal. 360  tablet 1   metoprolol  succinate (TOPROL -XL) 100 MG 24 hr tablet Take 1 tablet (100 mg total) by mouth daily. 90 tablet 1   NEEDLE, DISP, 25 G (CARETOUCH HYPODERMIC NEEDLE) 25G X 1-1/2 MISC 1 each by Does not apply route as directed. 30 each 11   omeprazole  (PRILOSEC) 20 MG capsule Take 20 mg by mouth daily.     OneTouch Delica Lancets 33G MISC 1 each by Does not apply route in the morning and at bedtime. 100 each 5   rosuvastatin  (CRESTOR ) 5 MG tablet Take 1 tablet (5 mg total) by mouth once a week. 52 tablet 0   tadalafil  (CIALIS ) 5 MG tablet TAKE 1 TABLET(5 MG) BY MOUTH DAILY 90 tablet 1   testosterone  cypionate (DEPOTESTOTERONE CYPIONATE) 100 MG/ML injection Inject 1.5 mLs (150 mg total) into the muscle every 14 (fourteen) days. For IM use only 3 mL 2   tirzepatide  (MOUNJARO ) 2.5 MG/0.5ML Pen Inject 2.5 mg into the skin once a week.     tirzepatide  (MOUNJARO ) 5 MG/0.5ML Pen Inject 5 mg into the skin once a week. 2 mL 1   traMADol  (ULTRAM ) 50 MG tablet Take 1 tablet (50 mg total) by mouth 2 (two) times daily. 60 tablet 2   No current facility-administered medications for this visit.    Patient confirms/reports the following allergies:  Allergies[1]  No orders of the defined types were placed in this encounter.   AUTHORIZATION INFORMATION Primary Insurance: 1D#: Group #:  Secondary Insurance: 1D#: Group #:  SCHEDULE INFORMATION: Date: 01/07/25 Time: Location: [facility]    [1]  Allergies Allergen Reactions   Other Other (See Comments)    Adhesive tape gave blisters   Flomax  [Tamsulosin ] Other (See Comments)    Sexual dysfunction   Semaglutide  Other (See Comments)    Fatigue   Statins Other (See Comments)    Muscle weakness, dizziness and fatigue

## 2024-09-15 NOTE — Telephone Encounter (Signed)
 Pharmacy Patient Advocate Encounter   Received notification from Onbase that prior authorization for Mounjaro  2.5MG /0.5ML auto-injectors is required/requested.   Insurance verification completed.   The patient is insured through [employer] ADVANTAGE/RX ADVANCE.   Per test claim: PA required and submitted KEY/EOC/Request #: [id number] from 09/15/24 to 09/15/25. Ran test claim, Copay is $0.00. This test claim was processed through [facility]- copay amounts may vary at other pharmacies due to pharmacy/plan contracts, or as the patient moves through the different stages of their insurance plan.

## 2024-09-16 NOTE — Progress Notes (Signed)
 Called patient and left a message to call back to get scheduled.

## 2024-09-18 ENCOUNTER — Telehealth: Payer: Self-pay

## 2024-09-18 NOTE — Telephone Encounter (Signed)
" ° °  Name: Andrew [name]  DOB: December 29, 1955  MRN: [id number]  Primary Cardiologist: None  Chart reviewed as part of pre-operative protocol coverage. Because of Andrew [name] past medical history and time since last visit, he will require a follow-up in-office visit in order to better assess preoperative cardiovascular risk.  Pre-op covering staff: - Please schedule appointment and call patient to inform them. If patient already had an upcoming appointment within acceptable timeframe, please add pre-op clearance to the appointment notes so provider is aware. - Please contact requesting surgeon's office via preferred method (i.e, phone, fax) to inform them of need for appointment prior to surgery.  ASA is prescribed by PCP, would recommend holding parameters coming from their office.   Andrew [name] Fabry, PA-C  09/18/2024, 3:06 PM   "

## 2024-09-18 NOTE — Telephone Encounter (Signed)
"  ° °  Pre-operative Risk Assessment    Patient Name: Andrew [name]  DOB: [birth year] MRN: [id number]   Date of last office visit: 12/19/22 Dr. Jordan Date of next office visit: NA   Request for Surgical Clearance    Procedure:  Colonoscopy  Date of Surgery:  Clearance 01/07/25                               Surgeon:  Dr. Rogelia Copping Surgeon's Group or Practice Name:  [facility] Phone number:  [phone] Fax number:  [phone]  Michelle   Type of Clearance Requested:   - Medical  - Pharmacy:  Hold Aspirin      Type of Anesthesia:  General    Additional requests/questions:    Andrew [name]   09/18/2024, 2:06 PM   "

## 2024-09-18 NOTE — Telephone Encounter (Signed)
 OV preop clearance appt now scheduled,  med rec and consent done.

## 2024-09-25 ENCOUNTER — Other Ambulatory Visit: Payer: Self-pay | Admitting: Family Medicine

## 2024-10-06 NOTE — Progress Notes (Signed)
2nd attempt to reach patient for scheduling.

## 2024-10-07 ENCOUNTER — Other Ambulatory Visit: Payer: Self-pay

## 2024-11-03 ENCOUNTER — Other Ambulatory Visit: Payer: Self-pay

## 2024-12-11 ENCOUNTER — Ambulatory Visit: Admitting: Family Medicine

## 2024-12-16 ENCOUNTER — Ambulatory Visit: Admitting: Physician Assistant

## 2025-01-07 ENCOUNTER — Ambulatory Visit: Admit: 2025-01-07 | Admitting: Gastroenterology

## 2025-01-07 SURGERY — COLONOSCOPY
Anesthesia: General
